# Patient Record
Sex: Male | Born: 1960 | Race: White | Hispanic: No | Marital: Single | Smoking: Never smoker
Health system: Southern US, Community
[De-identification: ages and names within clinical notes are randomized; demographics above are authoritative.]

## PROBLEM LIST (undated history)

## (undated) DIAGNOSIS — Z794 Long term (current) use of insulin: Secondary | ICD-10-CM

## (undated) DIAGNOSIS — I4891 Unspecified atrial fibrillation: Secondary | ICD-10-CM

## (undated) DIAGNOSIS — I2510 Atherosclerotic heart disease of native coronary artery without angina pectoris: Secondary | ICD-10-CM

## (undated) DIAGNOSIS — K3184 Gastroparesis: Secondary | ICD-10-CM

## (undated) DIAGNOSIS — D126 Benign neoplasm of colon, unspecified: Secondary | ICD-10-CM

## (undated) DIAGNOSIS — I729 Aneurysm of unspecified site: Secondary | ICD-10-CM

## (undated) DIAGNOSIS — G4730 Sleep apnea, unspecified: Secondary | ICD-10-CM

## (undated) DIAGNOSIS — K219 Gastro-esophageal reflux disease without esophagitis: Secondary | ICD-10-CM

## (undated) DIAGNOSIS — I10 Essential (primary) hypertension: Secondary | ICD-10-CM

## (undated) DIAGNOSIS — E119 Type 2 diabetes mellitus without complications: Secondary | ICD-10-CM

## (undated) DIAGNOSIS — IMO0001 Reserved for inherently not codable concepts without codable children: Secondary | ICD-10-CM

## (undated) DIAGNOSIS — E785 Hyperlipidemia, unspecified: Secondary | ICD-10-CM

## (undated) HISTORY — DX: Long term (current) use of insulin: Z79.4

## (undated) HISTORY — DX: Sleep apnea, unspecified: G47.30

## (undated) HISTORY — DX: Reserved for inherently not codable concepts without codable children: IMO0001

## (undated) HISTORY — PX: TONSILLECTOMY: SHX5217

## (undated) HISTORY — DX: Hyperlipidemia, unspecified: E78.5

## (undated) HISTORY — DX: Aneurysm of unspecified site: I72.9

## (undated) HISTORY — DX: Gastroparesis: K31.84

## (undated) HISTORY — DX: Atherosclerotic heart disease of native coronary artery without angina pectoris: I25.10

## (undated) HISTORY — DX: Benign neoplasm of colon, unspecified: D12.6

## (undated) HISTORY — DX: Unspecified atrial fibrillation: I48.91

## (undated) HISTORY — DX: Essential (primary) hypertension: I10

## (undated) HISTORY — DX: Gastro-esophageal reflux disease without esophagitis: K21.9

## (undated) HISTORY — DX: Type 2 diabetes mellitus without complications: E11.9

---

## 2010-10-24 ENCOUNTER — Ambulatory Visit (HOSPITAL_COMMUNITY)
Admission: RE | Admit: 2010-10-24 | Discharge: 2010-10-25 | Disposition: A | Discharge status: Home / Self Care | Payer: BC Managed Care – PPO | Source: Ambulatory Visit | Attending: Cardiovascular Disease | Admitting: Cardiovascular Disease

## 2010-10-24 DIAGNOSIS — R0989 Other specified symptoms and signs involving the circulatory and respiratory systems: Secondary | ICD-10-CM | POA: Insufficient documentation

## 2010-10-24 DIAGNOSIS — I10 Essential (primary) hypertension: Secondary | ICD-10-CM | POA: Insufficient documentation

## 2010-10-24 DIAGNOSIS — R0609 Other forms of dyspnea: Secondary | ICD-10-CM | POA: Insufficient documentation

## 2010-10-24 DIAGNOSIS — E119 Type 2 diabetes mellitus without complications: Secondary | ICD-10-CM | POA: Insufficient documentation

## 2010-10-24 HISTORY — PX: CARDIAC CATHETERIZATION: SHX172

## 2010-10-24 LAB — GLUCOSE, CAPILLARY: Glucose-Capillary: 174 mg/dL — ABNORMAL HIGH (ref 70–99)

## 2010-10-25 LAB — CBC
HCT: 40.4 % (ref 39.0–52.0)
Hemoglobin: 14.5 g/dL (ref 13.0–17.0)
MCH: 30.4 pg (ref 26.0–34.0)
RBC: 4.77 MIL/uL (ref 4.22–5.81)

## 2010-10-25 LAB — BASIC METABOLIC PANEL
CO2: 26 mEq/L (ref 19–32)
Glucose, Bld: 175 mg/dL — ABNORMAL HIGH (ref 70–99)
Potassium: 3.5 mEq/L (ref 3.5–5.1)
Sodium: 138 mEq/L (ref 135–145)

## 2010-10-27 NOTE — Cardiovascular Report (Signed)
NAME:  HAZE, ANTILLON NO.:  [id number]  MEDICAL RECORD NO.:  [id number]  LOCATION:  6523                         FACILITY:  [facility]  PHYSICIAN:  Nanetta Batty, M.D.   DATE OF BIRTH:  February 15, 1961  DATE OF PROCEDURE:  10/24/2010 DATE OF DISCHARGE:                           CARDIAC CATHETERIZATION   Mr. Wiebelhaus is a very pleasant 50 year old mildly overweight single Caucasian male with no children who recently relocated to [facility] and now works as a Proofreader housing at [employer].  The patient's risk factors include diabetes and hypertension.  His mother did have a stent at the age 63.  He has been having atypical chest pain and some dyspnea. Myoview stress test showed inferolateral ischemia.  He presents now for angiography and potential intervention.  DESCRIPTION OF PROCEDURE:  The patient was brought to the second floor [facility] cardiac cath lab in the postabsorptive state.  He was premedicated p.o. Valium and IV Versed.  His right groin was prepped and shaved in usual sterile fashion.  Xylocaine 1% was used for local anesthesia.  A 5-French sheath was inserted into the right femoral artery using standard Seldinger technique.  A 5-French right and left Judkins diagnostic catheter as well as 5-French pigtail catheter were used for selective cholangiography and left ventriculography respectively.  Visipaque dye was used for the entirety of the case. Left grade aortic, left ventricular blood pressures were recorded.  HEMODYNAMICS: 1. Aortic systolic pressure 170, diastolic pressure 90. 2. Left ventricular systolic pressure 177 and diastolic pressure 70.  SELECTIVE CORONARY ANGIOGRAPHY.: 1. Left main normal. 2. LAD; the LAD has 60% smooth mid segmental stenosis. 3. Left circumflex; totalled in the AV groove with faint left-to-left     collaterals. 4. Right coronary artery; dominant with a 50% proximal PDA lesion and     grade 2 right-to-left  collaterals. 5. Left ventriculography; RAO left ventriculogram was performed using     20 mL of Visipaque dye at 10 mL per second in the RAO and LAO     views.  The overall LVEF was estimated greater than 60% without     focal wall motion abnormalities.  IMPRESSION:  Mr. Vizcarrondo has had total large AV groove circumflex supplying significant marginal branch.  Does have ischemia, Myoview stress test, chest pain and dyspnea.  We will proceed with attempted recanalization of his "CTL."  DESCRIPTION OF PROCEDURE:  The existing 5-French sheath was upgraded to a 6-French sheath over wire.  The patient received 4 chewable aspirin, Effient 60 mg p.o. and Angiomax bolus with an ACT of 364.  A total of 210 mL of contrast was administered to the patient.  Median 6-French XB14 guide catheter along with 0.014 190 Asahi Prowater wire and a 2-0 15 and 28 emerge balloon.  I was able to cross the CTI with some difficulty.  I documented angiographically that I was in a true lumen and angioplasty at the segment of total occlusion.  A 200 mcg of intracoronary nitroglycerin was administered.  Following this, a 3030 resolute drug-eluting stent was then deployed 16 atmospheres.  The entire segment was postdilated with a 32520 Butler sprinter at 14 atmospheres (3.36-mm resulting reduction of a long  CT to 0% residual with excellent flow.  The patient tolerated the procedure well without hemodynamic cardiographic sequelae.  The guidewire and catheter removed. The sheath was then sewn securely in place.  The patient left lab in stable condition.  IMPRESSION:  Successful percutaneous coronary intervention stenting of a circumflex chronic total occlusion using resolute drug-eluting stent. The patient was treated with aspirin and Effient, hydrated and discharged home in the morning.  Left the lab in stable condition.     Nanetta Batty, M.D.     Cordelia Pen  D:  10/24/2010  T:  10/25/2010  Job:   [id number]  Electronically Signed by Nanetta Batty M.D. on 10/27/2010 03:37:09 PM

## 2010-10-31 ENCOUNTER — Emergency Department (HOSPITAL_COMMUNITY)
Admission: EM | Admit: 2010-10-31 | Discharge: 2010-11-01 | Disposition: A | Discharge status: Home / Self Care | Payer: BC Managed Care – PPO | Attending: Emergency Medicine | Admitting: Emergency Medicine

## 2010-10-31 DIAGNOSIS — E7800 Pure hypercholesterolemia, unspecified: Secondary | ICD-10-CM | POA: Insufficient documentation

## 2010-10-31 DIAGNOSIS — I2510 Atherosclerotic heart disease of native coronary artery without angina pectoris: Secondary | ICD-10-CM | POA: Insufficient documentation

## 2010-10-31 DIAGNOSIS — S7010XA Contusion of unspecified thigh, initial encounter: Secondary | ICD-10-CM | POA: Insufficient documentation

## 2010-10-31 DIAGNOSIS — G8918 Other acute postprocedural pain: Secondary | ICD-10-CM | POA: Insufficient documentation

## 2010-10-31 DIAGNOSIS — E119 Type 2 diabetes mellitus without complications: Secondary | ICD-10-CM | POA: Insufficient documentation

## 2010-10-31 DIAGNOSIS — Z79899 Other long term (current) drug therapy: Secondary | ICD-10-CM | POA: Insufficient documentation

## 2010-10-31 DIAGNOSIS — I10 Essential (primary) hypertension: Secondary | ICD-10-CM | POA: Insufficient documentation

## 2010-10-31 DIAGNOSIS — M546 Pain in thoracic spine: Secondary | ICD-10-CM | POA: Insufficient documentation

## 2010-10-31 DIAGNOSIS — R252 Cramp and spasm: Secondary | ICD-10-CM | POA: Insufficient documentation

## 2010-10-31 DIAGNOSIS — Z7982 Long term (current) use of aspirin: Secondary | ICD-10-CM | POA: Insufficient documentation

## 2010-10-31 DIAGNOSIS — Y840 Cardiac catheterization as the cause of abnormal reaction of the patient, or of later complication, without mention of misadventure at the time of the procedure: Secondary | ICD-10-CM | POA: Insufficient documentation

## 2010-11-01 LAB — PROTIME-INR: Prothrombin Time: 13.1 seconds (ref 11.6–15.2)

## 2010-11-01 LAB — DIFFERENTIAL
Lymphs Abs: 2.7 10*3/uL (ref 0.7–4.0)
Monocytes Relative: 7 % (ref 3–12)
Neutro Abs: 5 10*3/uL (ref 1.7–7.7)
Neutrophils Relative %: 57 % (ref 43–77)

## 2010-11-01 LAB — CBC
HCT: 40.6 % (ref 39.0–52.0)
Hemoglobin: 14.6 g/dL (ref 13.0–17.0)
MCH: 30.4 pg (ref 26.0–34.0)
MCV: 84.4 fL (ref 78.0–100.0)
RBC: 4.81 MIL/uL (ref 4.22–5.81)

## 2010-11-01 LAB — COMPREHENSIVE METABOLIC PANEL
Albumin: 4.2 g/dL (ref 3.5–5.2)
Alkaline Phosphatase: 107 U/L (ref 39–117)
BUN: 18 mg/dL (ref 6–23)
Chloride: 102 mEq/L (ref 96–112)
Glucose, Bld: 83 mg/dL (ref 70–99)
Potassium: 3.3 mEq/L — ABNORMAL LOW (ref 3.5–5.1)
Total Bilirubin: 0.3 mg/dL (ref 0.3–1.2)

## 2010-11-01 NOTE — Discharge Summary (Signed)
NAMEMarland Kitchen  Bradley Holmes, Bradley Holmes NO.:  [id number]  MEDICAL RECORD NO.:  [id number]  LOCATION:  6523                         FACILITY:  [facility]  PHYSICIAN:  Nanetta Batty, M.D.   DATE OF BIRTH:  02/02/1961  DATE OF ADMISSION:  10/24/2010 DATE OF DISCHARGE:  10/25/2010                              DISCHARGE SUMMARY   DISCHARGE DIAGNOSIS:  Coronary artery disease status post Resolute drug- eluting stent to the circumflex.  HOSPITAL COURSE:  Bradley Holmes is a 50 year old Caucasian male with history of hypertension, diabetes mellitus.  He recently underwent a 2-D echocardiogram which is normal and a Myoview stress test which was read as high risk for inferior and inferolateral ischemia.  He presented on August 14 for diagnostic left heart catheterization.  This resulted in identification of 60% smooth mid segmental stenosis in the LAD.  Also, identified totalled left circumflex in the AV groove with faint left-to- right collaterals, and subsequent underwent stenting of the circumflex with a Resolute drug-eluting stent.  Ejection fraction was noted 60% or greater without any focal wall motion abnormalities.  The patient currently was seen by Dr. Allyson Sabal, who feels he is stable for discharge home.  Follow up in 1 to 2 weeks.  DISCHARGE LABS:  WBC 10.1, hemoglobin 14.5, hematocrit 40.4, platelets 212.  Sodium 138, potassium 3.5, chloride 104, carbon dioxide 26, BUN 12, creatinine 0.87, glucose 175.  STUDIES/PROCEDURES:  Left heart catheterization, October 24, 2010.  Left main is normal.  The LAD had 60% smooth mid segmental stenosis.  Left circumflex was total in the AV groove with faint left-to-right collaterals.  The right coronary artery was dominant with 50% proximal PDA lesion and grade 2 right-to-left collaterals.  Ejection fraction estimated greater than 60% without focal wall motion abnormalities. Successful percutaneous coronary intervention, stenting of the circumflex,  chronic total occlusion using a Resolute drug-eluting stent.  DISCHARGE MEDICATIONS: 1. Acetaminophen 325 mg two tablets by mouth every 4 hours as needed     for pain. 2. Aspirin enteric coated 325 mg one tablet by mouth daily. 3. Prasugrel 10 mg one tablet by mouth daily. 4. Diltiazem CD XT 360 mg one capsule by mouth daily at bedtime. 5. Glyburide/metformin 5/500 mg two tablets by mouth twice daily.  The     patient will restart this medication on Thursday, October 26, 2010. 6. Hydrochlorothiazide 12.5 mg one capsule by mouth daily. 7. Linagliptin 5 mg one tablet by mouth daily. 8. Losartan 50 mg one tablet by mouth twice daily.  DISPOSITION:  Bradley Holmes will be discharged home in stable conditions. He is recommended to increase his activity slowly.  May shower and bathe.  No lifting for 3 days greater than 5 pounds.  No driving for 3 days.  He is recommended he returns to work as soon on October 30, 2010.  He is also recommended to eat a heart-healthy low-carbohydrate diet.  If catheter site becomes red, painful swollen, discharges fluid or pus, he is to call our office.  He will follow up with Dr. Allyson Sabal and our office will call him with the appointment time.    ______________________________ Wilburt Finlay, PA   ______________________________ Nanetta Batty, M.D.  BH/MEDQ  D:  10/25/2010  T:  10/25/2010  Job:  [id number]  cc:   Larina Earthly, M.D.  Electronically Signed by Wilburt Finlay PA on 10/27/2010 03:35:11 PM Electronically Signed by Nanetta Batty M.D. on 11/01/2010 03:27:52 PM

## 2010-12-01 ENCOUNTER — Ambulatory Visit (HOSPITAL_COMMUNITY): Payer: BC Managed Care – PPO

## 2010-12-04 ENCOUNTER — Ambulatory Visit (HOSPITAL_COMMUNITY): Payer: BC Managed Care – PPO

## 2010-12-04 ENCOUNTER — Other Ambulatory Visit: Payer: Self-pay | Admitting: Cardiovascular Disease

## 2010-12-04 ENCOUNTER — Encounter (HOSPITAL_COMMUNITY)
Admission: RE | Admit: 2010-12-04 | Discharge: 2010-12-04 | Disposition: A | Discharge status: Home / Self Care | Payer: BC Managed Care – PPO | Source: Ambulatory Visit | Attending: Cardiovascular Disease | Admitting: Cardiovascular Disease

## 2010-12-04 DIAGNOSIS — Z9861 Coronary angioplasty status: Secondary | ICD-10-CM | POA: Insufficient documentation

## 2010-12-04 DIAGNOSIS — Z5189 Encounter for other specified aftercare: Secondary | ICD-10-CM | POA: Insufficient documentation

## 2010-12-04 DIAGNOSIS — I2510 Atherosclerotic heart disease of native coronary artery without angina pectoris: Secondary | ICD-10-CM | POA: Insufficient documentation

## 2010-12-04 LAB — GLUCOSE, CAPILLARY: Glucose-Capillary: 212 mg/dL — ABNORMAL HIGH (ref 70–99)

## 2010-12-06 ENCOUNTER — Encounter (HOSPITAL_COMMUNITY): Payer: BC Managed Care – PPO

## 2010-12-06 ENCOUNTER — Other Ambulatory Visit: Payer: Self-pay | Admitting: Cardiovascular Disease

## 2010-12-08 ENCOUNTER — Other Ambulatory Visit: Payer: Self-pay | Admitting: Cardiovascular Disease

## 2010-12-08 ENCOUNTER — Encounter (HOSPITAL_COMMUNITY): Payer: BC Managed Care – PPO

## 2010-12-08 LAB — GLUCOSE, CAPILLARY: Glucose-Capillary: 165 mg/dL — ABNORMAL HIGH (ref 70–99)

## 2010-12-11 ENCOUNTER — Other Ambulatory Visit: Payer: Self-pay | Admitting: Cardiovascular Disease

## 2010-12-11 ENCOUNTER — Encounter (HOSPITAL_COMMUNITY): Payer: BC Managed Care – PPO | Attending: Cardiovascular Disease

## 2010-12-11 DIAGNOSIS — I2510 Atherosclerotic heart disease of native coronary artery without angina pectoris: Secondary | ICD-10-CM | POA: Insufficient documentation

## 2010-12-11 DIAGNOSIS — Z9861 Coronary angioplasty status: Secondary | ICD-10-CM | POA: Insufficient documentation

## 2010-12-11 DIAGNOSIS — Z5189 Encounter for other specified aftercare: Secondary | ICD-10-CM | POA: Insufficient documentation

## 2010-12-11 LAB — GLUCOSE, CAPILLARY: Glucose-Capillary: 218 mg/dL — ABNORMAL HIGH (ref 70–99)

## 2010-12-13 ENCOUNTER — Encounter (HOSPITAL_COMMUNITY): Payer: BC Managed Care – PPO

## 2010-12-13 ENCOUNTER — Other Ambulatory Visit: Payer: Self-pay | Admitting: Cardiovascular Disease

## 2010-12-13 LAB — GLUCOSE, CAPILLARY: Glucose-Capillary: 215 mg/dL — ABNORMAL HIGH (ref 70–99)

## 2010-12-15 ENCOUNTER — Other Ambulatory Visit: Payer: Self-pay | Admitting: Cardiovascular Disease

## 2010-12-15 ENCOUNTER — Encounter (HOSPITAL_COMMUNITY): Payer: BC Managed Care – PPO

## 2010-12-15 LAB — GLUCOSE, CAPILLARY: Glucose-Capillary: 262 mg/dL — ABNORMAL HIGH (ref 70–99)

## 2010-12-18 ENCOUNTER — Encounter (HOSPITAL_COMMUNITY): Payer: BC Managed Care – PPO

## 2010-12-20 ENCOUNTER — Encounter (HOSPITAL_COMMUNITY): Payer: BC Managed Care – PPO

## 2010-12-22 ENCOUNTER — Encounter (HOSPITAL_COMMUNITY): Payer: BC Managed Care – PPO

## 2010-12-25 ENCOUNTER — Encounter (HOSPITAL_COMMUNITY): Payer: BC Managed Care – PPO

## 2010-12-27 ENCOUNTER — Encounter (HOSPITAL_COMMUNITY): Payer: BC Managed Care – PPO

## 2010-12-29 ENCOUNTER — Encounter (HOSPITAL_COMMUNITY): Payer: BC Managed Care – PPO

## 2011-01-01 ENCOUNTER — Encounter (HOSPITAL_COMMUNITY): Payer: BC Managed Care – PPO

## 2011-01-03 ENCOUNTER — Encounter (HOSPITAL_COMMUNITY): Payer: BC Managed Care – PPO

## 2011-01-05 ENCOUNTER — Encounter (HOSPITAL_COMMUNITY): Payer: BC Managed Care – PPO

## 2011-01-08 ENCOUNTER — Encounter (HOSPITAL_COMMUNITY): Payer: BC Managed Care – PPO

## 2011-01-10 ENCOUNTER — Encounter (HOSPITAL_COMMUNITY): Payer: BC Managed Care – PPO

## 2011-01-12 ENCOUNTER — Encounter (HOSPITAL_COMMUNITY): Payer: BC Managed Care – PPO

## 2011-01-12 DIAGNOSIS — Z5189 Encounter for other specified aftercare: Secondary | ICD-10-CM | POA: Insufficient documentation

## 2011-01-12 DIAGNOSIS — I2510 Atherosclerotic heart disease of native coronary artery without angina pectoris: Secondary | ICD-10-CM | POA: Insufficient documentation

## 2011-01-12 DIAGNOSIS — Z9861 Coronary angioplasty status: Secondary | ICD-10-CM | POA: Insufficient documentation

## 2011-01-15 ENCOUNTER — Encounter (HOSPITAL_COMMUNITY): Payer: BC Managed Care – PPO

## 2011-01-17 ENCOUNTER — Encounter (HOSPITAL_COMMUNITY): Payer: BC Managed Care – PPO

## 2011-01-19 ENCOUNTER — Encounter (HOSPITAL_COMMUNITY): Payer: BC Managed Care – PPO

## 2011-01-22 ENCOUNTER — Encounter (HOSPITAL_COMMUNITY): Payer: BC Managed Care – PPO

## 2011-01-24 ENCOUNTER — Encounter (HOSPITAL_COMMUNITY): Payer: BC Managed Care – PPO

## 2011-01-26 ENCOUNTER — Encounter (HOSPITAL_COMMUNITY)
Admission: RE | Admit: 2011-01-26 | Discharge: 2011-01-26 | Disposition: A | Discharge status: Home / Self Care | Payer: BC Managed Care – PPO | Source: Ambulatory Visit | Attending: Cardiovascular Disease | Admitting: Cardiovascular Disease

## 2011-01-29 ENCOUNTER — Encounter (HOSPITAL_COMMUNITY)
Admission: RE | Admit: 2011-01-29 | Discharge: 2011-01-29 | Disposition: A | Discharge status: Home / Self Care | Payer: BC Managed Care – PPO | Source: Ambulatory Visit | Attending: Cardiovascular Disease | Admitting: Cardiovascular Disease

## 2011-01-31 ENCOUNTER — Encounter (HOSPITAL_COMMUNITY): Payer: BC Managed Care – PPO

## 2011-02-05 ENCOUNTER — Encounter (HOSPITAL_COMMUNITY)
Admission: RE | Admit: 2011-02-05 | Discharge: 2011-02-05 | Disposition: A | Discharge status: Home / Self Care | Payer: BC Managed Care – PPO | Source: Ambulatory Visit | Attending: Cardiovascular Disease | Admitting: Cardiovascular Disease

## 2011-02-07 ENCOUNTER — Encounter (HOSPITAL_COMMUNITY)
Admission: RE | Admit: 2011-02-07 | Discharge: 2011-02-07 | Disposition: A | Discharge status: Home / Self Care | Payer: BC Managed Care – PPO | Source: Ambulatory Visit | Attending: Cardiovascular Disease | Admitting: Cardiovascular Disease

## 2011-02-09 ENCOUNTER — Encounter (HOSPITAL_COMMUNITY)
Admission: RE | Admit: 2011-02-09 | Discharge: 2011-02-09 | Disposition: A | Discharge status: Home / Self Care | Payer: BC Managed Care – PPO | Source: Ambulatory Visit | Attending: Cardiovascular Disease | Admitting: Cardiovascular Disease

## 2011-02-12 ENCOUNTER — Encounter (HOSPITAL_COMMUNITY)
Admission: RE | Admit: 2011-02-12 | Discharge: 2011-02-12 | Disposition: A | Discharge status: Home / Self Care | Payer: BC Managed Care – PPO | Source: Ambulatory Visit | Attending: Cardiovascular Disease | Admitting: Cardiovascular Disease

## 2011-02-12 DIAGNOSIS — Z5189 Encounter for other specified aftercare: Secondary | ICD-10-CM | POA: Insufficient documentation

## 2011-02-12 DIAGNOSIS — I2510 Atherosclerotic heart disease of native coronary artery without angina pectoris: Secondary | ICD-10-CM | POA: Insufficient documentation

## 2011-02-12 DIAGNOSIS — Z9861 Coronary angioplasty status: Secondary | ICD-10-CM | POA: Insufficient documentation

## 2011-02-14 ENCOUNTER — Encounter (HOSPITAL_COMMUNITY)
Admission: RE | Admit: 2011-02-14 | Discharge: 2011-02-14 | Disposition: A | Discharge status: Home / Self Care | Payer: BC Managed Care – PPO | Source: Ambulatory Visit | Attending: Cardiovascular Disease | Admitting: Cardiovascular Disease

## 2011-02-16 ENCOUNTER — Encounter (HOSPITAL_COMMUNITY): Payer: BC Managed Care – PPO

## 2011-02-19 ENCOUNTER — Encounter (HOSPITAL_COMMUNITY)
Admission: RE | Admit: 2011-02-19 | Discharge: 2011-02-19 | Disposition: A | Discharge status: Home / Self Care | Payer: BC Managed Care – PPO | Source: Ambulatory Visit | Attending: Cardiovascular Disease | Admitting: Cardiovascular Disease

## 2011-02-21 ENCOUNTER — Encounter (HOSPITAL_COMMUNITY)
Admission: RE | Admit: 2011-02-21 | Discharge: 2011-02-21 | Disposition: A | Discharge status: Home / Self Care | Payer: BC Managed Care – PPO | Source: Ambulatory Visit | Attending: Cardiovascular Disease | Admitting: Cardiovascular Disease

## 2011-02-23 ENCOUNTER — Encounter (HOSPITAL_COMMUNITY)
Admission: RE | Admit: 2011-02-23 | Discharge: 2011-02-23 | Disposition: A | Discharge status: Home / Self Care | Payer: BC Managed Care – PPO | Source: Ambulatory Visit | Attending: Cardiovascular Disease | Admitting: Cardiovascular Disease

## 2011-02-26 ENCOUNTER — Encounter (HOSPITAL_COMMUNITY)
Admission: RE | Admit: 2011-02-26 | Discharge: 2011-02-26 | Disposition: A | Discharge status: Home / Self Care | Payer: BC Managed Care – PPO | Source: Ambulatory Visit | Attending: Cardiovascular Disease | Admitting: Cardiovascular Disease

## 2011-02-28 ENCOUNTER — Encounter (HOSPITAL_COMMUNITY): Payer: BC Managed Care – PPO

## 2011-03-02 ENCOUNTER — Encounter (HOSPITAL_COMMUNITY)
Admission: RE | Admit: 2011-03-02 | Discharge: 2011-03-02 | Disposition: A | Discharge status: Home / Self Care | Payer: BC Managed Care – PPO | Source: Ambulatory Visit | Attending: Cardiovascular Disease | Admitting: Cardiovascular Disease

## 2011-03-05 ENCOUNTER — Encounter (HOSPITAL_COMMUNITY): Payer: BC Managed Care – PPO

## 2011-03-07 ENCOUNTER — Encounter (HOSPITAL_COMMUNITY): Payer: BC Managed Care – PPO

## 2011-03-09 ENCOUNTER — Encounter (HOSPITAL_COMMUNITY): Payer: BC Managed Care – PPO

## 2011-03-16 ENCOUNTER — Encounter (HOSPITAL_COMMUNITY)
Admission: RE | Admit: 2011-03-16 | Discharge: 2011-03-16 | Disposition: A | Discharge status: Home / Self Care | Payer: BC Managed Care – PPO | Source: Ambulatory Visit | Attending: Cardiovascular Disease | Admitting: Cardiovascular Disease

## 2011-03-16 DIAGNOSIS — Z9861 Coronary angioplasty status: Secondary | ICD-10-CM | POA: Insufficient documentation

## 2011-03-16 DIAGNOSIS — I2510 Atherosclerotic heart disease of native coronary artery without angina pectoris: Secondary | ICD-10-CM | POA: Insufficient documentation

## 2011-03-16 DIAGNOSIS — Z5189 Encounter for other specified aftercare: Secondary | ICD-10-CM | POA: Insufficient documentation

## 2011-03-19 ENCOUNTER — Encounter (HOSPITAL_COMMUNITY)
Admission: RE | Admit: 2011-03-19 | Discharge: 2011-03-19 | Disposition: A | Discharge status: Home / Self Care | Payer: BC Managed Care – PPO | Source: Ambulatory Visit | Attending: Cardiovascular Disease | Admitting: Cardiovascular Disease

## 2011-03-21 ENCOUNTER — Encounter (HOSPITAL_COMMUNITY)
Admission: RE | Admit: 2011-03-21 | Discharge: 2011-03-21 | Disposition: A | Discharge status: Home / Self Care | Payer: BC Managed Care – PPO | Source: Ambulatory Visit | Attending: Cardiovascular Disease | Admitting: Cardiovascular Disease

## 2011-03-23 ENCOUNTER — Encounter (HOSPITAL_COMMUNITY)
Admission: RE | Admit: 2011-03-23 | Discharge: 2011-03-23 | Disposition: A | Discharge status: Home / Self Care | Payer: BC Managed Care – PPO | Source: Ambulatory Visit | Attending: Cardiovascular Disease | Admitting: Cardiovascular Disease

## 2011-03-26 ENCOUNTER — Encounter (HOSPITAL_COMMUNITY)
Admission: RE | Admit: 2011-03-26 | Discharge: 2011-03-26 | Disposition: A | Discharge status: Home / Self Care | Payer: BC Managed Care – PPO | Source: Ambulatory Visit | Attending: Cardiovascular Disease | Admitting: Cardiovascular Disease

## 2011-03-28 ENCOUNTER — Encounter (HOSPITAL_COMMUNITY): Admission: RE | Admit: 2011-03-28 | Payer: BC Managed Care – PPO | Source: Ambulatory Visit

## 2011-03-30 ENCOUNTER — Encounter (HOSPITAL_COMMUNITY): Payer: BC Managed Care – PPO

## 2011-04-02 ENCOUNTER — Encounter (HOSPITAL_COMMUNITY): Payer: BC Managed Care – PPO

## 2011-04-04 ENCOUNTER — Encounter (HOSPITAL_COMMUNITY)
Admission: RE | Admit: 2011-04-04 | Discharge: 2011-04-04 | Disposition: A | Discharge status: Home / Self Care | Payer: BC Managed Care – PPO | Source: Ambulatory Visit | Attending: Cardiovascular Disease | Admitting: Cardiovascular Disease

## 2011-04-05 NOTE — Progress Notes (Addendum)
Cardiac Rehabilitation Program Progress Report   Orientation:  11/30/2010 Graduate Date:  04/04/2011 Discharge Date:   # of sessions completed: 36  Cardiologist: Wilhemina Cash MD:  Ava Class Time:  0815  A.  Exercise Program:  Tolerates exercise @ 3.5 METS for 30 minutes, Improved functional capacity  3.26 %, Improved  muscular strength  10.26 %, Improved  flexibility 25.00 % and Discharged to home exercise program.  Anticipated compliance:  fair  B.  Mental Health:    C.  Education/Instruction/Skills  Accurately checks own pulse, Knows THR for exercise, Uses Perceived Exertion Scale and Attended 10 education classes    D.  Nutrition/Weight Control/Body Composition:  % Body Fat  34.5 and Patient has gained 0.6 kg BMI 33.4, Pt following a step 2 TLC diet on admission to Cardiac Rehab.  No post-rehab data available to assess.  *This section completed by Mickle Plumb, Andres Shad, RD, LDN, CDE  E.  Blood Lipids No results found for this basename: CHOL, HDL, LDLCALC, LDLDIRECT, TRIG, CHOLHDL  *This section completed by Mickle Plumb, Andres Shad, RD, LDN, CDE  F.  Lifestyle Changes:    G.  Symptoms noted with exercise:  Asymptomatic  Report Completed By:  Dayton Martes   Comments:  Signed by Harriett Sine MS on Thursday January 24th 2013 at 0712       Pt successfully graduated from cardiac rehab program, attending 35/36 sessions from 12/04/2010-04/04/2011.  Pt had good attendance with exercise and educational classes.  Pt had significant bradycardia at rest (lowest rate-44) pt asymptomatic, strips faxed to Dr. Allyson Sabal for review.  Pt also hypertensive with exercise (highest BP-196/80 following a workload increase-down to 140/70 with decreased workload)  Exercise induced hypertension primarily noted while pt using airdyne.  Dr. Allyson Sabal made aware, no new orders received. On 12/25/2010 pt had brief episode of epigastric discomfort, VSS, no significant ST changes noted,  sx relieved with rest.  Pt denied any further episodes of exertional discomfort.  Pt did not have hospital admission during cardiac rehab period.   Pt has made positive lifestyle changes and should be encouraged to continue.  pt plans to exercise on his own at fitness facility.  Thank you for the referral.  Deveron Furlong, RN Cardiac Pulmonary Rehab

## 2011-04-06 ENCOUNTER — Encounter (HOSPITAL_COMMUNITY): Payer: BC Managed Care – PPO

## 2011-04-27 ENCOUNTER — Encounter (HOSPITAL_COMMUNITY): Payer: Self-pay | Admitting: Cardiac Rehabilitation

## 2011-05-15 ENCOUNTER — Encounter (HOSPITAL_COMMUNITY): Payer: Self-pay | Admitting: Cardiac Rehabilitation

## 2012-07-30 ENCOUNTER — Other Ambulatory Visit: Payer: Self-pay | Admitting: Cardiovascular Disease

## 2012-08-01 ENCOUNTER — Other Ambulatory Visit: Payer: Self-pay | Admitting: *Deleted

## 2012-08-01 NOTE — Telephone Encounter (Signed)
Refill request for Bystolic.  No record of Bystolic on file and pt not seen since May 2013.  Faxed back to send to original prescriber.

## 2012-08-05 ENCOUNTER — Telehealth: Payer: Self-pay | Admitting: Cardiovascular Disease

## 2012-08-05 MED ORDER — PRASUGREL HCL 10 MG PO TABS: 10.0000 mg | ORAL_TABLET | Freq: Every day | ORAL | Status: DC

## 2012-08-05 NOTE — Telephone Encounter (Signed)
Returning call from [facility] from Friday!

## 2012-08-05 NOTE — Telephone Encounter (Signed)
Returned call.  Pt informed call was r/t refill request for Bystolic.  Not on med list and pt had not been seen since May 2013.  Pt informed refill was sent, but he still needs an appt.  Pt also requested refill on effient.  Appt scheduled for 6.6.14 at 10:30am w/ Dr. Allyson Sabal for annual f/u and rx sent to [facility] [phone]) per pt request.

## 2012-08-05 NOTE — Telephone Encounter (Signed)
Returning your call. °

## 2012-08-05 NOTE — Telephone Encounter (Signed)
Returned call. Left message to call back.

## 2012-08-09 ENCOUNTER — Other Ambulatory Visit: Payer: Self-pay | Admitting: Cardiovascular Disease

## 2012-08-12 NOTE — Telephone Encounter (Signed)
Sent in refill on 5/27 with pharmacy confirmation of receipt

## 2012-08-12 NOTE — Telephone Encounter (Signed)
Already done. Pharmacy confirmed receipt

## 2012-08-15 ENCOUNTER — Ambulatory Visit: Payer: BC Managed Care – PPO | Admitting: Cardiovascular Disease

## 2012-08-29 ENCOUNTER — Encounter: Payer: Self-pay | Admitting: Cardiovascular Disease

## 2012-09-01 ENCOUNTER — Ambulatory Visit (INDEPENDENT_AMBULATORY_CARE_PROVIDER_SITE_OTHER): Payer: BC Managed Care – PPO | Admitting: Cardiovascular Disease

## 2012-09-01 ENCOUNTER — Encounter: Payer: Self-pay | Admitting: Cardiovascular Disease

## 2012-09-01 VITALS — BP 122/76 | HR 52 | Ht 66.0 in | Wt 217.0 lb

## 2012-09-01 DIAGNOSIS — IMO0001 Reserved for inherently not codable concepts without codable children: Secondary | ICD-10-CM

## 2012-09-01 DIAGNOSIS — E119 Type 2 diabetes mellitus without complications: Secondary | ICD-10-CM

## 2012-09-01 DIAGNOSIS — I10 Essential (primary) hypertension: Secondary | ICD-10-CM

## 2012-09-01 DIAGNOSIS — E785 Hyperlipidemia, unspecified: Secondary | ICD-10-CM

## 2012-09-01 DIAGNOSIS — Z79899 Other long term (current) drug therapy: Secondary | ICD-10-CM

## 2012-09-01 DIAGNOSIS — Z794 Long term (current) use of insulin: Secondary | ICD-10-CM

## 2012-09-01 DIAGNOSIS — I2510 Atherosclerotic heart disease of native coronary artery without angina pectoris: Secondary | ICD-10-CM

## 2012-09-01 MED ORDER — CLOPIDOGREL BISULFATE 75 MG PO TABS: 75.0000 mg | ORAL_TABLET | Freq: Every day | ORAL | Status: DC

## 2012-09-01 NOTE — Assessment & Plan Note (Signed)
On statin medication followed by Dr. Felipa Eth

## 2012-09-01 NOTE — Assessment & Plan Note (Signed)
Status post stenting of a circumflex, total occlusion 10/24/10 with a resolute drug-eluting stent. He had a 50% mid LAD and a 50% proximal PDA with normal LV function. Subsequent Myoview stress test performed 01/05/11 showed improvement in his lateral ischemia. He gets occasional atypical chest pain nothing like his pre-intervention symptoms. He continues to get FEN. I'm going to stop this and begin him on clopidogrel and check a verify now test to determine efficacy.

## 2012-09-01 NOTE — Progress Notes (Signed)
09/01/2012 Bradley Holmes   August 30, 1960  [id number]  Primary Physician Hoyle Sauer, MD Primary Cardiologist: Runell Gess MD Roseanne Reno   HPI:  The patient is a 52 year old mildly overweight single Caucasian male with no children whom I last saw in the office 11/09/2010. 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 He was initially referred because of an abnormal EKG with risk factors that included diabetes, hypertension and hyperlipidemia as well as family history. He was getting some chest pain and dyspnea. His EKG showed poor R-wave progression with Q-waves, a left anterior fascicular block. Myoview stress test showed inferolateral scar with mild peri/infarct ischemia, and because of this I performed cardiac catheterization on him 10/24/2010 revealing an occluded circumflex in the mid AV groove, 50% mid LAD and proximal PDA stenosis with normal LV function. I was ultimately able to cross his chronically occluded circumflex and stented him with a resolute 3.0 x 30 drug-eluting stent. Since that time, he had done well. A followup Myoview showed improved perfusion to the lateral wall. Since I saw him a year ago he's unremarkable he well. He gets occasional atypical left inframammary memory chest pain not like his preintervention symptoms.   Current Outpatient Prescriptions  Medication Sig Dispense Refill  . Acetaminophen (TYLENOL PO) Take by mouth as needed.      Marland Kitchen aspirin 325 MG tablet Take 325 mg by mouth daily.      . BD PEN NEEDLE NANO U/F 32G X 4 MM MISC       . BYSTOLIC 10 MG tablet TAKE 1 TABLET EVERY EVENING  30 tablet  2  . Coenzyme Q10 (CO Q-10) 200 MG CAPS Take 1 tablet by mouth daily.      Marland Kitchen doxycycline (MONODOX) 100 MG capsule Take 100 mg by mouth 2 (two) times daily.      Marland Kitchen glyBURIDE-metformin (GLUCOVANCE) 2.5-500 MG per tablet Take 1 tablet by mouth. 2 tablets in morning and 1  tablet in the evening      . INVOKANA 300 MG TABS Take 300 mg by mouth daily.      Marland Kitchen LANTUS SOLOSTAR 100 UNIT/ML SOPN 12units daily      . prasugrel (EFFIENT) 10 MG TABS Take 1 tablet (10 mg total) by mouth daily.  30 tablet  3  . rosuvastatin (CRESTOR) 5 MG tablet Take 5 mg by mouth every other day.       . triamcinolone cream (KENALOG) 0.1 % as needed.      Marya Landry 40-10-25 MG TABS Take 1 tablet by mouth daily.        No current facility-administered medications for this visit.    Allergies  Allergen Reactions  . Norvasc (Amlodipine Besylate) Other (See Comments)    Edema  . Sulfa Antibiotics Hives    History   Social History  . Marital Status: Single    Spouse Name: N/A    Number of Children: N/A  . Years of Education: N/A   Occupational History  . Not on file.   Social History Main Topics  . Smoking status: Never Smoker   . Smokeless tobacco: Not on file  . Alcohol Use: No  . Drug Use: Not on file  . Sexually Active: Not on file   Other Topics Concern  . Not on file   Social History Narrative  . No narrative on file     Review of Systems: General:  negative for chills, fever, night sweats or weight changes.  Cardiovascular: negative for chest pain, dyspnea on exertion, edema, orthopnea, palpitations, paroxysmal nocturnal dyspnea or shortness of breath Dermatological: negative for rash Respiratory: negative for cough or wheezing Urologic: negative for hematuria Abdominal: negative for nausea, vomiting, diarrhea, bright red blood per rectum, melena, or hematemesis Neurologic: negative for visual changes, syncope, or dizziness All other systems reviewed and are otherwise negative except as noted above.    Blood pressure 122/76, pulse 52, height 5\' 6"  (1.676 m), weight 217 lb (98.431 kg).  General appearance: alert and no distress Neck: no adenopathy, no carotid bruit, no JVD, supple, symmetrical, trachea midline and thyroid not enlarged, symmetric, no  tenderness/mass/nodules Lungs: clear to auscultation bilaterally Heart: regular rate and rhythm, S1, S2 normal, no murmur, click, rub or gallop Extremities: extremities normal, atraumatic, no cyanosis or edema  EKG sinus bradycardia at 52 with T wave inversion in leads III and F unchanged from prior EKGs  ASSESSMENT AND PLAN:   Coronary artery disease Status post stenting of a circumflex, total occlusion 10/24/10 with a resolute drug-eluting stent. He had a 50% mid LAD and a 50% proximal PDA with normal LV function. Subsequent Myoview stress test performed 01/05/11 showed improvement in his lateral ischemia. He gets occasional atypical chest pain nothing like his pre-intervention symptoms. He continues to get FEN. I'm going to stop this and begin him on clopidogrel and check a verify now test to determine efficacy.  Essential hypertension Under good control on current medications  Hyperlipidemia On statin medication followed by Dr. Felipa Eth      Runell Gess MD [facility], [facility] 09/01/2012 11:46 AM

## 2012-09-01 NOTE — Patient Instructions (Addendum)
Stop the effient Start plavix 75 mg daily  After you have been on plavix for 2 weeks, have the bloodwork done at [facility] to see if the Plavix is working  Follow up with Dr Allyson Sabal in 12 months

## 2012-09-01 NOTE — Assessment & Plan Note (Signed)
Under good control on current medications 

## 2012-11-14 ENCOUNTER — Ambulatory Visit (HOSPITAL_COMMUNITY)
Admission: RE | Admit: 2012-11-14 | Discharge: 2012-11-14 | Disposition: A | Discharge status: Home / Self Care | Payer: BC Managed Care – PPO | Source: Ambulatory Visit | Attending: Cardiovascular Disease | Admitting: Cardiovascular Disease

## 2012-11-14 ENCOUNTER — Encounter: Payer: Self-pay | Admitting: Internal Medicine

## 2012-11-16 ENCOUNTER — Other Ambulatory Visit: Payer: Self-pay | Admitting: Cardiovascular Disease

## 2012-11-17 ENCOUNTER — Encounter: Payer: Self-pay | Admitting: *Deleted

## 2012-11-17 NOTE — Telephone Encounter (Signed)
Rx was sent to pharmacy electronically. 

## 2012-12-12 ENCOUNTER — Ambulatory Visit (INDEPENDENT_AMBULATORY_CARE_PROVIDER_SITE_OTHER): Payer: BC Managed Care – PPO | Admitting: Internal Medicine

## 2012-12-12 ENCOUNTER — Encounter: Payer: Self-pay | Admitting: Internal Medicine

## 2012-12-12 ENCOUNTER — Telehealth: Payer: Self-pay | Admitting: *Deleted

## 2012-12-12 VITALS — BP 100/62 | HR 50 | Ht 67.0 in | Wt 220.2 lb

## 2012-12-12 DIAGNOSIS — R1319 Other dysphagia: Secondary | ICD-10-CM

## 2012-12-12 DIAGNOSIS — D689 Coagulation defect, unspecified: Secondary | ICD-10-CM

## 2012-12-12 MED ORDER — MOVIPREP 100 G PO SOLR: 1.0000 | Freq: Once | ORAL | Status: DC

## 2012-12-12 NOTE — Patient Instructions (Addendum)
You have been scheduled for a colonoscopy with propofol. Please follow written instructions given to you at your visit today.  Please pick up your prep kit at the pharmacy within the next 1-3 days. If you use inhalers (even only as needed), please bring them with you on the day of your procedure. Your physician has requested that you go to www.startemmi.com and enter the access code given to you at your visit today. This web site gives a general overview about your procedure. However, you should still follow specific instructions given to you by our office regarding your preparation for the procedure.  You will be contacted by our office prior to your procedure for directions on holding your Plavix.  If you do not hear from our office 1 week prior to your scheduled procedure, please call [phone] to discuss.   CC: Dr R.Avva, Dr Allyson Sabal

## 2012-12-12 NOTE — Telephone Encounter (Signed)
12/12/2012 RE: Bradley Holmes DOB: 1960-09-05 MRN: [id number]  Dear Dr Allyson Sabal,   We have scheduled the above patient for a colonoscopy. Our records show that he is on anticoagulation therapy.  Please advise as to whether the patient may come off his therapy of Plavix 5 days prior to the procedure, which is scheduled for 01/02/13. Please fax back/ or route your response to Vernia Buff, CMA at [phone].   Sincerely,  Vernia Buff

## 2012-12-12 NOTE — Progress Notes (Signed)
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 He is an insulin-dependent diabetic and has coronary artery disease for which he is status post drug-eluting stent placement by Dr.Berry in August 2012 to the circumflex artery. He has a normal LV function and he is on Plavix 75 mg daily. He had a recent visit with Dr.Berry.  He is here to discuss having a colonoscopy. Several years ago when he lived in [place], patient had an episode of food impaction which was relieved spontaneously and he subsequently underwent an upper endoscopy but did not require dilatation. Since then, he has intermittent solid food dysphagia less than once a month. He has not had any impaction or regurgitation recently. He did take some acid reducing agents but is not currently.   Past Medical History  Diagnosis Date  . Hypertension     2D ECHO, 10/10/2010 - EF >55%, normal  . Coronary artery disease     LEXISCAN, 01/05/2011 - defect seen in Basal Inferior, Mid Inferior, and Apical Lateral region(s)-consistent with infarct/scar, global LV systolic function mildly reduced, abnormal study  . Pseudoaneurysm     LEA DOPPLER, 11/01/2010 - right groin hematoma measuring 2.47x0.62x1.61 centimeters in greatest diameter  . Insulin dependent diabetes mellitus   . Hyperlipidemia    Past Surgical History  Procedure Laterality Date  . Cardiac catheterization  10/24/2010    CTI - 3030 Resolute drug-eluting stent, 3.35mm resulting reduction og a long CT to 0% residual with excellent flow.    reports that he has never smoked. He has never used smokeless tobacco. He reports that  drinks alcohol. He reports that he does not use illicit drugs. family history includes Diabetes in his mother and sister; Emphysema in his paternal grandfather; Heart disease (age of onset: 19) in his mother; Prostate cancer in his maternal grandfather; Thyroid  cancer in his brother. Allergies  Allergen Reactions  . Norvasc [Amlodipine Besylate] Other (See Comments)    Edema  . Sulfa Antibiotics Hives        Review of Systems:Positive for dysphagia to solids. Normal bowel habits. No rectal bleeding   The remainder of the 10 point ROS is negative except as outlined in H&P   Physical Exam: General appearance  Well developed, in no distress. Eyes- non icteric. HEENT nontraumatic, normocephalic. Mouth no lesions, tongue papillated, no cheilosis. Neck supple without adenopathy, thyroid not enlarged, no carotid bruits, no JVD. Lungs Clear to auscultation bilaterally. Cor normal S1, normal S2, regular rhythm, no murmur,  quiet precordium. Abdomen:  soft nontender with normoactive bowel sounds. No tenderness. Liver edge at costal margin.  Rectal: not done.  Extremities trace  pedal edema. Skin no lesions. Neurological alert and oriented x 3. Psychological normal mood and affect.  Assessment and Plan:  Problem #36 52 year old white male who is a good candidate for a screening colonoscopy. We have discussed the indications, the prep and the procedure. His insulin will be adjusted. We will ask Dr Allyson Sabal to approve holding Plavix for 5 days prior to the colonoscopy. Apparently, this was done one year ago for another procedure.  Problem #2 Gastroesophageal reflux and intermittent solid food dysphagia. I offered the patient an upper endoscopy and possible dilation but he feels that his symptoms are not bothersome and do not occur enough to warrant an upper endoscopy.   12/12/2012 Lina Sar

## 2012-12-15 ENCOUNTER — Encounter: Payer: Self-pay | Admitting: Internal Medicine

## 2012-12-19 ENCOUNTER — Telehealth: Payer: Self-pay | Admitting: Cardiovascular Disease

## 2012-12-19 ENCOUNTER — Encounter: Payer: Self-pay | Admitting: *Deleted

## 2012-12-19 NOTE — Telephone Encounter (Signed)
Left message with [facility] requesting that Dr Allyson Sabal respond to Plavix clearance letter for patient's upcoming procedure 01/02/13.

## 2012-12-19 NOTE — Telephone Encounter (Signed)
Informed Bradley Holmes the patient has been cleared, and she should be getting a note today.

## 2012-12-19 NOTE — Telephone Encounter (Signed)
Patient advised that Dr Allyson Sabal has okayed him holding his plavix 5 days prior to his procedure on 01/02/13. Patient verbalizes understanding. However, he states that he has cancelled his colonoscopy procedure until the beginning of 2015.

## 2012-12-19 NOTE — Telephone Encounter (Signed)
Still waiting on his clarence for Plavix-Scheduled for procedure ion 10-24-Please fax or send through Epic .

## 2013-01-02 ENCOUNTER — Encounter: Payer: BC Managed Care – PPO | Admitting: Internal Medicine

## 2013-02-11 ENCOUNTER — Other Ambulatory Visit: Payer: Self-pay | Admitting: Cardiovascular Disease

## 2013-02-11 NOTE — Telephone Encounter (Signed)
Rx was sent to pharmacy electronically. 

## 2013-06-24 ENCOUNTER — Emergency Department (HOSPITAL_COMMUNITY)
Admission: EM | Admit: 2013-06-24 | Discharge: 2013-06-24 | Disposition: A | Discharge status: Home / Self Care | Payer: BC Managed Care – PPO | Attending: Emergency Medicine | Admitting: Emergency Medicine

## 2013-06-24 ENCOUNTER — Emergency Department (HOSPITAL_COMMUNITY): Payer: BC Managed Care – PPO

## 2013-06-24 ENCOUNTER — Encounter (HOSPITAL_COMMUNITY): Payer: Self-pay | Admitting: Emergency Medicine

## 2013-06-24 DIAGNOSIS — R109 Unspecified abdominal pain: Secondary | ICD-10-CM

## 2013-06-24 DIAGNOSIS — R638 Other symptoms and signs concerning food and fluid intake: Secondary | ICD-10-CM | POA: Insufficient documentation

## 2013-06-24 DIAGNOSIS — Z9889 Other specified postprocedural states: Secondary | ICD-10-CM | POA: Insufficient documentation

## 2013-06-24 DIAGNOSIS — N200 Calculus of kidney: Secondary | ICD-10-CM | POA: Insufficient documentation

## 2013-06-24 DIAGNOSIS — Z7982 Long term (current) use of aspirin: Secondary | ICD-10-CM | POA: Insufficient documentation

## 2013-06-24 DIAGNOSIS — R319 Hematuria, unspecified: Secondary | ICD-10-CM

## 2013-06-24 DIAGNOSIS — R112 Nausea with vomiting, unspecified: Secondary | ICD-10-CM | POA: Insufficient documentation

## 2013-06-24 DIAGNOSIS — I10 Essential (primary) hypertension: Secondary | ICD-10-CM | POA: Insufficient documentation

## 2013-06-24 DIAGNOSIS — Z794 Long term (current) use of insulin: Secondary | ICD-10-CM | POA: Insufficient documentation

## 2013-06-24 DIAGNOSIS — I2510 Atherosclerotic heart disease of native coronary artery without angina pectoris: Secondary | ICD-10-CM | POA: Insufficient documentation

## 2013-06-24 DIAGNOSIS — R739 Hyperglycemia, unspecified: Secondary | ICD-10-CM

## 2013-06-24 DIAGNOSIS — D3500 Benign neoplasm of unspecified adrenal gland: Secondary | ICD-10-CM | POA: Insufficient documentation

## 2013-06-24 DIAGNOSIS — Z79899 Other long term (current) drug therapy: Secondary | ICD-10-CM | POA: Insufficient documentation

## 2013-06-24 DIAGNOSIS — N134 Hydroureter: Secondary | ICD-10-CM | POA: Insufficient documentation

## 2013-06-24 DIAGNOSIS — E119 Type 2 diabetes mellitus without complications: Secondary | ICD-10-CM

## 2013-06-24 DIAGNOSIS — Z7902 Long term (current) use of antithrombotics/antiplatelets: Secondary | ICD-10-CM | POA: Insufficient documentation

## 2013-06-24 LAB — URINALYSIS, ROUTINE W REFLEX MICROSCOPIC
BILIRUBIN URINE: NEGATIVE
Glucose, UA: >1000 mg/dL — AB
KETONES UR: NEGATIVE mg/dL
LEUKOCYTES UA: NEGATIVE
NITRITE: NEGATIVE
Protein, ur: NEGATIVE mg/dL
SPECIFIC GRAVITY, URINE: 1.023 (ref 1.005–1.030)
UROBILINOGEN UA: 0.2 mg/dL (ref 0.0–1.0)
pH: 7 (ref 5.0–8.0)

## 2013-06-24 LAB — BASIC METABOLIC PANEL
BUN: 21 mg/dL (ref 6–23)
CHLORIDE: 97 meq/L (ref 96–112)
CO2: 24 meq/L (ref 19–32)
CREATININE: 1.25 mg/dL (ref 0.50–1.35)
Calcium: 10.1 mg/dL (ref 8.4–10.5)
GFR calc non Af Amer: 65 mL/min — ABNORMAL LOW (ref >90)
GFR, EST AFRICAN AMERICAN: 75 mL/min — AB (ref >90)
Glucose, Bld: 278 mg/dL — ABNORMAL HIGH (ref 70–99)
POTASSIUM: 3.4 meq/L — AB (ref 3.7–5.3)
Sodium: 140 mEq/L (ref 137–147)

## 2013-06-24 LAB — URINE MICROSCOPIC-ADD ON

## 2013-06-24 MED ORDER — HYDROCODONE-ACETAMINOPHEN 5-325 MG PO TABS: 2.0000 | ORAL_TABLET | ORAL | Status: DC | PRN

## 2013-06-24 MED ORDER — ONDANSETRON 4 MG PO TBDP: ORAL_TABLET | ORAL | Status: DC

## 2013-06-24 MED ORDER — ONDANSETRON HCL 4 MG/2ML IJ SOLN
4.0000 mg | Freq: Once | INTRAMUSCULAR | Status: AC
  Administered 2013-06-24: 4 mg via INTRAVENOUS
  Filled 2013-06-24: qty 2

## 2013-06-24 MED ORDER — KETOROLAC TROMETHAMINE 30 MG/ML IJ SOLN
30.0000 mg | Freq: Once | INTRAMUSCULAR | Status: AC
  Administered 2013-06-24: 30 mg via INTRAVENOUS
  Filled 2013-06-24: qty 1

## 2013-06-24 MED ORDER — SODIUM CHLORIDE 0.9 % IV SOLN
Freq: Once | INTRAVENOUS | Status: AC
  Administered 2013-06-24: 11:00:00 via INTRAVENOUS

## 2013-06-24 MED ORDER — HYDROMORPHONE HCL PF 1 MG/ML IJ SOLN
1.0000 mg | Freq: Once | INTRAMUSCULAR | Status: AC
  Administered 2013-06-24: 1 mg via INTRAVENOUS
  Filled 2013-06-24: qty 1

## 2013-06-24 NOTE — ED Notes (Signed)
Pt states that he began having rt sided flank pain x 1 hr ago.  Denies vomiting or diarrhea.  Denies difficulty urinating.

## 2013-06-24 NOTE — ED Provider Notes (Signed)
CSN: [id number]     Arrival date & time 06/24/13  1010 History   First MD Initiated Contact with Patient 06/24/13 1025     Chief Complaint  Patient presents with  . Flank Pain     (Consider location/radiation/quality/duration/timing/severity/associated sxs/prior Treatment) HPI Comments: 53 yo male with CAD, HTN, DM presents with acute right flank pain since 1 hr PTA. No hx of similar, no stone hx.  No abdo surgery hx.  Non smoker.  Pain constant, non focal.  Mild nausea.  Nothing improves. Radiates to right groin.   Patient is a 53 y.o. male presenting with flank pain. The history is provided by the patient.  Flank Pain This is a new problem. Pertinent negatives include no chest pain, no abdominal pain, no headaches and no shortness of breath.    Past Medical History  Diagnosis Date  . Hypertension     2D ECHO, 10/10/2010 - EF >55%, normal  . Coronary artery disease     LEXISCAN, 01/05/2011 - defect seen in Basal Inferior, Mid Inferior, and Apical Lateral region(s)-consistent with infarct/scar, global LV systolic function mildly reduced, abnormal study  . Pseudoaneurysm     LEA DOPPLER, 11/01/2010 - right groin hematoma measuring 2.47x0.62x1.61 centimeters in greatest diameter  . Insulin dependent diabetes mellitus   . Hyperlipidemia    Past Surgical History  Procedure Laterality Date  . Cardiac catheterization  10/24/2010    [facility] - 3030 Resolute drug-eluting stent, 3.22m resulting reduction og a long CT to 0% residual with excellent flow.   Family History  Problem Relation Age of Onset  . Heart disease Mother 654   Stent placement  . Diabetes Mother   . Diabetes Sister   . Thyroid cancer Brother     2010  . Prostate cancer Maternal Grandfather   . Emphysema Paternal Grandfather    History  Substance Use Topics  . Smoking status: Never Smoker   . Smokeless tobacco: Never Used  . Alcohol Use: Yes     Comment: occassionally    Review of Systems  Constitutional:  Positive for appetite change. Negative for fever and chills.  HENT: Negative for congestion.   Eyes: Negative for visual disturbance.  Respiratory: Negative for shortness of breath.   Cardiovascular: Negative for chest pain.  Gastrointestinal: Positive for nausea and vomiting. Negative for abdominal pain.  Genitourinary: Positive for flank pain. Negative for dysuria.  Musculoskeletal: Negative for back pain, neck pain and neck stiffness.  Skin: Negative for rash.  Neurological: Negative for light-headedness and headaches.      Allergies  Norvasc and Sulfa antibiotics  Home Medications   Prior to Admission medications   Medication Sig Start Date End Date Taking? Authorizing Provider  Acetaminophen (TYLENOL PO) Take by mouth as needed.    Historical Provider, MD  aspirin 325 MG tablet Take 325 mg by mouth daily.    Historical Provider, MD  BD PEN NEEDLE NANO U/F 32G X 4 MM MISC  06/24/12   Historical Provider, MD  BYSTOLIC 10 MG tablet TAKE 1 TABLET EVERY EVENING 07/30/12   JLorretta Harp MD  clopidogrel (PLAVIX) 75 MG tablet Take 1 tablet (75 mg total) by mouth daily. 09/01/12   JLorretta Harp MD  Coenzyme Q10 (CO Q-10) 200 MG CAPS Take 1 tablet by mouth daily.    Historical Provider, MD  CRESTOR 5 MG tablet TAKE 1 TABLET BY MOUTH EVERY OTHER DAY 11/16/12   JLorretta Harp MD  glyBURIDE-metformin (GLUCOVANCE) 2.5-500 MG per  tablet Take 1 tablet by mouth. 2 tablets in morning and 1 tablet in the evening    Historical Provider, MD  INVOKANA 300 MG TABS Take 300 mg by mouth daily. 08/20/12   Historical Provider, MD  LANTUS SOLOSTAR 100 UNIT/ML SOPN 12units daily 06/24/12   Historical Provider, MD  MOVIPREP 100 G SOLR Take 1 kit (200 g total) by mouth once. 12/12/12   Lafayette Dragon, MD  NITROSTAT 0.4 MG SL tablet PLACE 1 TABLET UNDER TONGUE EVERY 5 MINS AS NEEDED FOR UP TO 3 DOSES 02/11/13   [name], MD  ONE TOUCH ULTRA TEST test strip  11/14/12   Historical Provider, MD   triamcinolone cream (KENALOG) 0.1 % as needed. 07/23/12   Historical Provider, MD  TRIBENZOR 40-10-25 MG TABS Take 1 tablet by mouth daily.  08/21/12   Historical Provider, MD   BP 159/76  Pulse 56  Temp(Src) 96.8 F (36 C) (Oral)  Resp 15  SpO2 100% Physical Exam  Nursing note and vitals reviewed. Constitutional: He is oriented to person, place, and time. He appears well-developed and well-nourished.  HENT:  Head: Normocephalic and atraumatic.  Dry mm  Eyes: Conjunctivae are normal. Right eye exhibits no discharge. Left eye exhibits no discharge.  Neck: Normal range of motion. Neck supple. No tracheal deviation present.  Cardiovascular: Normal rate and regular rhythm.   Pulmonary/Chest: Effort normal and breath sounds normal.  Abdominal: Soft. He exhibits no distension. There is no tenderness. There is no guarding.  Musculoskeletal: He exhibits tenderness (right mid flank). He exhibits no edema.  Neurological: He is alert and oriented to person, place, and time.  Skin: Skin is warm. No rash noted.  Psychiatric: He has a normal mood and affect.    ED Course  Procedures (including critical care time) Emergency Focused Ultrasound Exam Limited retroperitoneal ultrasound of kidneys  Performed and interpreted by Dr. Reather Converse Indication: flank pain Focused abdominal ultrasound with both kidneys imaged in transverse and longitudinal planes in real-time. Interpretation: moderate right hydronephrosis visualized.  No stones or cysts visualized  Images archived electronically  Labs Review Labs Reviewed  URINALYSIS, ROUTINE W REFLEX MICROSCOPIC - Abnormal; Notable for the following:    Glucose, UA >1000 (*)    Hgb urine dipstick SMALL (*)    All other components within normal limits  BASIC METABOLIC PANEL - Abnormal; Notable for the following:    Potassium 3.4 (*)    Glucose, Bld 278 (*)    GFR calc non Af Amer 65 (*)    GFR calc Af Amer 75 (*)    All other components within normal  limits  URINE MICROSCOPIC-ADD ON    Imaging Review Ct Abdomen Pelvis Wo Contrast  06/24/2013   CLINICAL DATA:  1 hr history of right flank discomfort without dysuria or GI symptoms.  EXAM: CT ABDOMEN AND PELVIS WITHOUT CONTRAST  TECHNIQUE: Multidetector CT imaging of the abdomen and pelvis was performed following the standard protocol without intravenous contrast.  COMPARISON:  None.  FINDINGS: There is mild right-sided hydronephrosis and hydroureter secondary to a 4 mm diameter stone at the right ureterovesicle junction. This demonstrated best on image 83. No other stones in the right kidney are demonstrated. There is increased density in the perinephric fat on the right. The left kidney exhibits no evidence of stones nor obstruction. The psoas musculature is normal in contour and density. The partially distended urinary bladder is normal in appearance. The prostate gland and seminal vesicles appear normal. There  is a shallow left inguinal hernia containing fat.  The appendix is normal in appearance. There is a 2 mm diameter appendicolith in its tip. The large and small bowel exhibit no evidence of obstruction or ileus or inflammation. The liver, gallbladder, pancreas, spleen, and partially distended stomach appear normal. There is an 11 mm diameter focal right adrenal mass with HU density positive +10. The left adrenal gland is normal in appearance. The caliber of the abdominal aorta is normal. The periaortic and pericaval regions are normal in appearance.  The lung bases are clear. The lumbar vertebral bodies are preserved in height. There is a focal area of sclerosis anteriorly in the body of L2. The bony pelvis exhibits no acute abnormality. S1 is transitional.  IMPRESSION: 1. There is moderate right-sided hydronephrosis and hydroureter secondary to a 4 mm diameter stone at the right UVJ. No stones are demonstrated elsewhere on the right nor on the left. 2. There is no evidence of acute appendicitis nor  other acute bowel abnormality. There is a shallow left inguinal hernia. 3. There is an 11 mm right adrenal mass with Hounsfield measurement of +10 most compatible with an adenoma. 4. There is no acute hepatobiliary abnormality.   Electronically Signed   By: David  Martinique   On: 06/24/2013 11:50     EKG Interpretation None      MDM   Final diagnoses:  Adrenal adenoma  Hematuria  Right nephrolithiasis  Hydroureter on right  Right flank pain  Hyperglycemia  Diabetes   Concern for kidney stone. Bedside US moderate hydronephrosis. Toradol with minimal pain relief.  Fluids and dilaudid. Pt improved on recheck, pain controlled. CT showed 4 mm stone and similar hydro seen on [place], adrenal mass 11 mm, likely adenoma, pt will need close fup outpt.   Fup outpt with urology.  Discussed close fup for adenoma.  Results and differential diagnosis were discussed with the patient. Close follow up outpatient was discussed, patient comfortable with the plan.   Filed Vitals:   06/24/13 1018 06/24/13 1150  BP: 163/75 159/76  Pulse: 58 56  Temp: 96.8 F (36 C)   TempSrc: Oral   Resp: 23 15  SpO2: 100% 100%      Mariea Clonts, MD 06/24/13 1208

## 2013-06-24 NOTE — Discharge Instructions (Signed)
Take ibuprofen 600 mg every 6 hrs for pain. For severe pain take norco or vicodin however realize they have the potential for addiction and it can make you sleepy and has tylenol in it.  No operating machinery while taking. Strain your urine. Follow up with primary doctor for adrenal adenoma for further evaluation. See urology. If you were given medicines take as directed.  If you are on coumadin or contraceptives realize their levels and effectiveness is altered by many different medicines.  If you have any reaction (rash, tongues swelling, other) to the medicines stop taking and see a physician.   Have blood sugar monitored and checked as it is running high. Please follow up as directed and return to the ER or see a physician for new or worsening symptoms.  Thank you. Filed Vitals:   06/24/13 1018 06/24/13 1150  BP: 163/75 159/76  Pulse: 58 56  Temp: 96.8 F (36 C)   TempSrc: Oral   Resp: 23 15  SpO2: 100% 100%

## 2013-09-14 ENCOUNTER — Other Ambulatory Visit: Payer: Self-pay | Admitting: Cardiovascular Disease

## 2013-09-14 NOTE — Telephone Encounter (Signed)
Rx was sent to pharmacy electronically. 

## 2013-10-28 ENCOUNTER — Other Ambulatory Visit: Payer: Self-pay | Admitting: Internal Medicine

## 2013-10-28 DIAGNOSIS — N5089 Other specified disorders of the male genital organs: Secondary | ICD-10-CM

## 2013-10-29 ENCOUNTER — Ambulatory Visit
Admission: RE | Admit: 2013-10-29 | Discharge: 2013-10-29 | Disposition: A | Discharge status: Home / Self Care | Payer: BC Managed Care – PPO | Source: Ambulatory Visit | Attending: Internal Medicine | Admitting: Internal Medicine

## 2013-10-29 DIAGNOSIS — N5089 Other specified disorders of the male genital organs: Secondary | ICD-10-CM

## 2013-11-26 ENCOUNTER — Encounter: Payer: Self-pay | Admitting: *Deleted

## 2013-12-03 ENCOUNTER — Encounter: Payer: Self-pay | Admitting: Internal Medicine

## 2013-12-03 ENCOUNTER — Ambulatory Visit (INDEPENDENT_AMBULATORY_CARE_PROVIDER_SITE_OTHER): Payer: BC Managed Care – PPO | Admitting: Internal Medicine

## 2013-12-03 VITALS — BP 130/66 | HR 60 | Ht 66.5 in | Wt 227.5 lb

## 2013-12-03 DIAGNOSIS — D689 Coagulation defect, unspecified: Secondary | ICD-10-CM

## 2013-12-03 DIAGNOSIS — Z1211 Encounter for screening for malignant neoplasm of colon: Secondary | ICD-10-CM

## 2013-12-03 MED ORDER — MOVIPREP 100 G PO SOLR: 1.0000 | Freq: Once | ORAL | Status: DC

## 2013-12-03 NOTE — Progress Notes (Signed)
Bradley Holmes 10-08-1960 [id number]  Note: This dictation was prepared with Dragon digital system. Any transcriptional errors that result from this procedure are unintentional.   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 who is here to discuss having a screening colonoscopy. We saw him one year ago for the same reason and he decided to wait until this time. He is a diabetic. He has coronary artery disease and a drug-eluting stent placed by Dr. Gwenlyn Found in August 2014 to the circumflex artery. He has normal left ventricular function. He at that time had solid food dysphagia and had a food impaction in the remote past but currently he denies any dysphagia, heartburn or any upper GI symptoms. We were initially planning to do both endoscopy and colonoscopy but he denies any upper GI symptoms at this time.    Past Medical History  Diagnosis Date  . Hypertension     2D ECHO, 10/10/2010 - EF >55%, normal  . Coronary artery disease     LEXISCAN, 01/05/2011 - defect seen in Basal Inferior, Mid Inferior, and Apical Lateral region(s)-consistent with infarct/scar, global LV systolic function mildly reduced, abnormal study  . Pseudoaneurysm     LEA DOPPLER, 11/01/2010 - right groin hematoma measuring 2.47x0.62x1.61 centimeters in greatest diameter  . Insulin dependent diabetes mellitus   . Hyperlipidemia   . GERD (gastroesophageal reflux disease)     Past Surgical History  Procedure Laterality Date  . Cardiac catheterization  10/24/2010    [facility]I - 3030 Resolute drug-eluting stent, 3.2mm resulting reduction og a long CT to 0% residual with excellent flow.    Allergies  Allergen Reactions  . Norvasc [Amlodipine Besylate] Other (See Comments)    Edema  . Sulfa Antibiotics Hives    Family history and social history have been reviewed.  Review of Systems: Denies dysphagia, odynophagia or heartburn. Normal bowel habits. No family history of  colon cancer  The remainder of the 10 point ROS is negative except as outlined in the H&P  Physical Exam: General Appearance Well developed, in no distress, mildly overweight Eyes  Non icteric  HEENT  Non traumatic, normocephalic  Mouth No lesion, tongue papillated, no cheilosis Neck Supple without adenopathy, thyroid not enlarged, no carotid bruits, no JVD Lungs Clear to auscultation bilaterally COR Normal S1, normal S2, regular rhythm, no murmur, quiet precordium Abdomen protuberant soft, nontender. Normal active bowel sounds  Rectal not done  Extremities  No pedal edema Skin No lesions Neurological Alert and oriented x 3 Psychological Normal mood and affect  Assessment and Plan:   Problem #25 53 year old white male who is an appropriate candidate for a screening colonoscopy. He is a diabetic and he has been on Plavix for coronary artery disease with placement of a stent. We have obtained permission from Dr. Gwenlyn Found after his last visit to hold Plavix for 5 days prior to the procedure and possibly longer if we find polyps. We will adjust his insulin requirements during prep for colonoscopy which can be scheduled for November 2015. We have discussed the sedation , the procedure itself and the prep.    Bradley Holmes 12/03/2013

## 2013-12-03 NOTE — Patient Instructions (Addendum)
You have been scheduled for a colonoscopy. Please follow written instructions given to you at your visit today.  Please pick up your prep kit at the pharmacy within the next 1-3 days. If you use inhalers (even only as needed), please bring them with you on the day of your procedure. Your physician has requested that you go to www.startemmi.com and enter the access code given to you at your visit today. This web site gives a general overview about your procedure. However, you should still follow specific instructions given to you by our office regarding your preparation for the procedure.  CC: Dr Quay Burow, Dr Dagmar Hait

## 2013-12-09 ENCOUNTER — Other Ambulatory Visit: Payer: Self-pay | Admitting: Cardiovascular Disease

## 2013-12-09 NOTE — Telephone Encounter (Signed)
Rx was sent to pharmacy electronically. Patient has OV 12/18/13

## 2013-12-11 ENCOUNTER — Other Ambulatory Visit: Payer: Self-pay | Admitting: Cardiovascular Disease

## 2013-12-11 NOTE — Telephone Encounter (Signed)
Rx was sent to pharmacy electronically. Patient has office visit 10/9

## 2013-12-14 ENCOUNTER — Encounter: Payer: Self-pay | Admitting: Internal Medicine

## 2013-12-18 ENCOUNTER — Ambulatory Visit (INDEPENDENT_AMBULATORY_CARE_PROVIDER_SITE_OTHER): Payer: BC Managed Care – PPO | Admitting: Cardiovascular Disease

## 2013-12-18 ENCOUNTER — Encounter: Payer: Self-pay | Admitting: Cardiovascular Disease

## 2013-12-18 VITALS — BP 150/80 | HR 53 | Ht 66.0 in | Wt 226.0 lb

## 2013-12-18 DIAGNOSIS — E785 Hyperlipidemia, unspecified: Secondary | ICD-10-CM

## 2013-12-18 DIAGNOSIS — I2510 Atherosclerotic heart disease of native coronary artery without angina pectoris: Secondary | ICD-10-CM

## 2013-12-18 DIAGNOSIS — I10 Essential (primary) hypertension: Secondary | ICD-10-CM

## 2013-12-18 DIAGNOSIS — I2583 Coronary atherosclerosis due to lipid rich plaque: Secondary | ICD-10-CM

## 2013-12-18 NOTE — Assessment & Plan Note (Signed)
Controlled on current medications 

## 2013-12-18 NOTE — Progress Notes (Signed)
12/18/2013 Bradley Holmes   [birth year]  [id number]  Primary Physician Tivis Ringer, MD Primary Cardiologist: Bradley Harp MD Bradley Holmes   HPI:  The patient is a 53 year old mildly overweight single Caucasian male with no children whom I last saw in the office 09/01/12. 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 He was initially referred because of an abnormal EKG with risk factors that included diabetes, hypertension and hyperlipidemia as well as family history. He was getting some chest pain and dyspnea. His EKG showed poor R-wave progression with Q-waves, a left anterior fascicular block. Myoview stress test showed inferolateral scar with mild peri/infarct ischemia, and because of this I performed cardiac catheterization on him 10/24/2010 revealing an occluded circumflex in the mid AV groove, 50% mid LAD and proximal PDA stenosis with normal LV function. I was ultimately able to cross his chronically occluded circumflex and stented him with a resolute 3.0 x 30 drug-eluting stent. Since that time, he had done well. A followup Myoview showed improved perfusion to the lateral wall. Since I saw him a year ago he has done remarkably well. He denies chest pain but does get some dyspnea on exertion.    Current Outpatient Prescriptions  Medication Sig Dispense Refill  . Acetaminophen (TYLENOL PO) Take by mouth as needed.      Marland Kitchen aspirin 325 MG tablet Take 325 mg by mouth daily.      . BD PEN NEEDLE NANO U/F 32G X 4 MM MISC       . BYSTOLIC 10 MG tablet TAKE 1 TABLET EVERY EVENING  30 tablet  2  . clopidogrel (PLAVIX) 75 MG tablet TAKE 1 TABLET BY MOUTH EVERY DAY  90 tablet  0  . Coenzyme Q10 (CO Q-10) 200 MG CAPS Take 1 tablet by mouth daily.      . CRESTOR 5 MG tablet TAKE 1 TABLET BY MOUTH EVERY OTHER DAY  15 tablet  1  . Insulin Aspart (NOVOLOG FLEXPEN [place]) Inject 10 Units into the skin 3  (three) times daily with meals. Short term quick acting      . INVOKANA 300 MG TABS Take 300 mg by mouth daily.      Marland Kitchen LANTUS SOLOSTAR 100 UNIT/ML SOPN 30 Units.       Marland Kitchen MOVIPREP 100 G SOLR Take 1 kit (200 g total) by mouth once.  1 kit  0  . NITROSTAT 0.4 MG SL tablet PLACE 1 TABLET UNDER TONGUE EVERY 5 MINS AS NEEDED FOR UP TO 3 DOSES  25 tablet  3  . ONE TOUCH ULTRA TEST test strip       . triamcinolone cream (KENALOG) 0.1 % as needed.      [name] 40-10-25 MG TABS Take 1 tablet by mouth daily.        No current facility-administered medications for this visit.    Allergies  Allergen Reactions  . Norvasc [Amlodipine Besylate] Other (See Comments)    Edema  . Sulfa Antibiotics Hives    History   Social History  . Marital Status: Single    Spouse Name: N/A    Number of Children: N/A  . Years of Education: N/A   Occupational History  . Not on file.   Social History Main Topics  . Smoking status: Never Smoker   . Smokeless tobacco: Never Used  . Alcohol Use: Yes     Comment:  occassionally  . Drug Use: No  . Sexual Activity: Not on file   Other Topics Concern  . Not on file   Social History Narrative  . No narrative on file     Review of Systems: General: negative for chills, fever, night sweats or weight changes.  Cardiovascular: negative for chest pain, dyspnea on exertion, edema, orthopnea, palpitations, paroxysmal nocturnal dyspnea or shortness of breath Dermatological: negative for rash Respiratory: negative for cough or wheezing Urologic: negative for hematuria Abdominal: negative for nausea, vomiting, diarrhea, bright red blood per rectum, melena, or hematemesis Neurologic: negative for visual changes, syncope, or dizziness All other systems reviewed and are otherwise negative except as noted above.    Blood pressure 150/80, pulse 53, height _0  (1.676 m), weight 226 lb (102.513 kg).  General appearance: alert and no distress Neck: no adenopathy,  no carotid bruit, no JVD, supple, symmetrical, trachea midline and thyroid not enlarged, symmetric, no tenderness/mass/nodules Lungs: clear to auscultation bilaterally Heart: regular rate and rhythm, S1, S2 normal, no murmur, click, rub or gallop Extremities: extremities normal, atraumatic, no cyanosis or edema  EKG post pericardiectomy with poor R-wave progression and left anterior fascicular block  ASSESSMENT AND PLAN:   Coronary artery disease History of CAD status post circumflex chronic total occlusion revascularization with a resolute premalignant millimeter by 30 mm long drug-eluting stent 10/24/10. He did have moderate disease in his LAD and PDA with normal LV function. A followup Myoview showed improved perfusion to the lateral wall. He denies chest pain or shortness of breath.  Essential hypertension Controlled on current medications  Hyperlipidemia On statin therapy followed by his PCP      Bradley Harp MD [facility], [facility] 12/18/2013 10:54 AM

## 2013-12-18 NOTE — Assessment & Plan Note (Signed)
On statin therapy followed by his PCP 

## 2013-12-18 NOTE — Patient Instructions (Signed)
Your physician wants you to follow-up in: 1 year with Dr Berry. You will receive a reminder letter in the mail two months in advance. If you don't receive a letter, please call our office to schedule the follow-up appointment.  

## 2013-12-18 NOTE — Assessment & Plan Note (Signed)
History of CAD status post circumflex chronic total occlusion revascularization with a resolute premalignant millimeter by 30 mm long drug-eluting stent 10/24/10. He did have moderate disease in his LAD and PDA with normal LV function. A followup Myoview showed improved perfusion to the lateral wall. He denies chest pain or shortness of breath.

## 2014-02-10 ENCOUNTER — Ambulatory Visit (AMBULATORY_SURGERY_CENTER): Payer: BC Managed Care – PPO | Admitting: Internal Medicine

## 2014-02-10 ENCOUNTER — Encounter: Payer: Self-pay | Admitting: Internal Medicine

## 2014-02-10 VITALS — BP 127/78 | HR 49 | Temp 97.7°F | Resp 19 | Ht 66.0 in | Wt 227.0 lb

## 2014-02-10 DIAGNOSIS — Z1211 Encounter for screening for malignant neoplasm of colon: Secondary | ICD-10-CM

## 2014-02-10 DIAGNOSIS — D124 Benign neoplasm of descending colon: Secondary | ICD-10-CM

## 2014-02-10 DIAGNOSIS — D122 Benign neoplasm of ascending colon: Secondary | ICD-10-CM

## 2014-02-10 LAB — GLUCOSE, CAPILLARY
Glucose-Capillary: 195 mg/dL — ABNORMAL HIGH (ref 70–99)
Glucose-Capillary: 199 mg/dL — ABNORMAL HIGH (ref 70–99)

## 2014-02-10 MED ORDER — SODIUM CHLORIDE 0.9 % IV SOLN: 500.0000 mL | INTRAVENOUS | Status: DC

## 2014-02-10 NOTE — Patient Instructions (Signed)
RESUME YOUR PLAVIX IN AM,02/11/14.   YOU HAD AN ENDOSCOPIC PROCEDURE TODAY AT [facility]: Refer to the procedure report that was given to you for any specific questions about what was found during the examination.  If the procedure report does not answer your questions, please call your gastroenterologist to clarify.  If you requested that your care partner not be given the details of your procedure findings, then the procedure report has been included in a sealed envelope for you to review at your convenience later.  YOU SHOULD EXPECT: Some feelings of bloating in the abdomen. Passage of more gas than usual.  Walking can help get rid of the air that was put into your GI tract during the procedure and reduce the bloating. If you had a lower endoscopy (such as a colonoscopy or flexible sigmoidoscopy) you may notice spotting of blood in your stool or on the toilet paper. If you underwent a bowel prep for your procedure, then you may not have a normal bowel movement for a few days.  DIET: Your first meal following the procedure should be a light meal and then it is ok to progress to your normal diet.  A half-sandwich or bowl of soup is an example of a good first meal.  Heavy or fried foods are harder to digest and may make you feel nauseous or bloated.  Likewise meals heavy in dairy and vegetables can cause extra gas to form and this can also increase the bloating.  Drink plenty of fluids but you should avoid alcoholic beverages for 24 hours.  ACTIVITY: Your care partner should take you home directly after the procedure.  You should plan to take it easy, moving slowly for the rest of the day.  You can resume normal activity the day after the procedure however you should NOT DRIVE or use heavy machinery for 24 hours (because of the sedation medicines used during the test).    SYMPTOMS TO REPORT IMMEDIATELY: A gastroenterologist can be reached at any hour.  During normal business hours,  8:30 AM to 5:00 PM Monday through Friday, call [phone].  After hours and on weekends, please call the GI answering service at [phone] who will take a message and have the physician on call contact you.   Following lower endoscopy (colonoscopy or flexible sigmoidoscopy):  Excessive amounts of blood in the stool  Significant tenderness or worsening of abdominal pains  Swelling of the abdomen that is new, acute  Fever of 100F or higher  FOLLOW UP: If any biopsies were taken you will be contacted by phone or by letter within the next 1-3 weeks.  Call your gastroenterologist if you have not heard about the biopsies in 3 weeks.  Our staff will call the home number listed on your records the next business day following your procedure to check on you and address any questions or concerns that you may have at that time regarding the information given to you following your procedure. This is a courtesy call and so if there is no answer at the home number and we have not heard from you through the emergency physician on call, we will assume that you have returned to your regular daily activities without incident.  SIGNATURES/CONFIDENTIALITY: You and/or your care partner have signed paperwork which will be entered into your electronic medical record.  These signatures attest to the fact that that the information above on your After Visit Summary has been reviewed and is understood.  Full responsibility of the confidentiality of this discharge information lies with you and/or your care-partner.

## 2014-02-10 NOTE — Op Note (Signed)
[place]  [address] [employer] [address]. [place], [id number]   COLONOSCOPY PROCEDURE REPORT  PATIENT: Fidencio, Duddy  MR#: [id number] BIRTHDATE: June 15, 1960 , 24  yrs. old GENDER: male ENDOSCOPIST: Lafayette Dragon, MD REFERRED QM:[id number] Avva, M.D. PROCEDURE DATE:  02/10/2014 PROCEDURE:   Colonoscopy with cold biopsy polypectomy First Screening Colonoscopy - Avg.  risk and is 50 yrs.  old or older Yes.  Prior Negative Screening - Now for repeat screening. N/A  History of Adenoma - Now for follow-up colonoscopy & has been > or = to 3 yrs.  N/A  Polyps Removed Today? Yes. ASA CLASS:   Class II INDICATIONS:average risk for colon cancer. MEDICATIONS: Monitored anesthesia care and Propofol 200 mg IV  DESCRIPTION OF PROCEDURE:   After the risks benefits and alternatives of the procedure were thoroughly explained, informed consent was obtained.  The digital rectal exam revealed no abnormalities of the rectum.   The LB [id number]  endoscope was introduced through the anus and advanced to the cecum, which was identified by both the appendix and ileocecal valve. No adverse events experienced.   The quality of the prep was good, using MoviPrep  The instrument was then slowly withdrawn as the colon was fully examined.      COLON FINDINGS: Three firm sessile polyps measuring 5 mm in size were found.in ascending x1 and descending x 2 colon.  A polypectomy was performed with cold forceps.  The resection was complete, the polyp tissue was completely retrieved and sent to histology. Retroflexed views revealed no abnormalities. The time to cecum=4 minutes 10 seconds.  Withdrawal time=9 minutes 34 seconds.  The scope was withdrawn and the procedure completed. COMPLICATIONS: There were no immediate complications.  ENDOSCOPIC IMPRESSION: Three sessile polyps were found; polypectomy was performed with cold forceps mild diverticulosis of the left colon   RECOMMENDATIONS: 1.   Await pathology results 2.  Resume Plavix tomorrow High fiber diet Recall colonoscopy pending path report  eSigned:  Lafayette Dragon, MD 02/10/2014 9:49 AM   cc:   PATIENT NAME:  Sair, Faulcon MR#: [id number]

## 2014-02-10 NOTE — Progress Notes (Signed)
Pt stable to RR 

## 2014-02-10 NOTE — Progress Notes (Signed)
Called to room to assist during endoscopic procedure.  Patient ID and intended procedure confirmed with present staff. Received instructions for my participation in the procedure from the performing physician.  

## 2014-02-11 ENCOUNTER — Telehealth: Payer: Self-pay | Admitting: *Deleted

## 2014-02-11 NOTE — Telephone Encounter (Signed)
  Follow up Call-  Call back number 02/10/2014  Post procedure Call Back phone  # [phone]  Permission to leave phone message Yes     Patient questions:  Do you have a fever, pain , or abdominal swelling? No. Pain Score  0 *  Have you tolerated food without any problems? Yes.    Have you been able to return to your normal activities? No.  Do you have any questions about your discharge instructions: Diet   No. Medications  No. Follow up visit  No.  Do you have questions or concerns about your Care? No.  Actions: * If pain score is 4 or above: No action needed, pain <4.

## 2014-02-15 ENCOUNTER — Encounter: Payer: Self-pay | Admitting: Internal Medicine

## 2014-03-04 ENCOUNTER — Other Ambulatory Visit: Payer: Self-pay | Admitting: Cardiovascular Disease

## 2014-03-04 NOTE — Telephone Encounter (Signed)
Rx refill sent to patient pharmacy   

## 2014-03-10 ENCOUNTER — Other Ambulatory Visit: Payer: Self-pay | Admitting: Cardiovascular Disease

## 2014-03-10 NOTE — Telephone Encounter (Signed)
[facility] refilled #15 with 9 on 03/04/14 take QOD

## 2014-03-10 NOTE — Telephone Encounter (Signed)
E-SENT RX #90 DAY REFILL X 3

## 2014-03-24 ENCOUNTER — Emergency Department (HOSPITAL_COMMUNITY): Payer: BC Managed Care – PPO

## 2014-03-24 ENCOUNTER — Encounter (HOSPITAL_COMMUNITY): Payer: Self-pay | Admitting: Emergency Medicine

## 2014-03-24 ENCOUNTER — Emergency Department (HOSPITAL_COMMUNITY)
Admission: EM | Admit: 2014-03-24 | Discharge: 2014-03-24 | Disposition: A | Discharge status: Home / Self Care | Payer: BC Managed Care – PPO | Attending: Emergency Medicine | Admitting: Emergency Medicine

## 2014-03-24 DIAGNOSIS — R55 Syncope and collapse: Secondary | ICD-10-CM

## 2014-03-24 DIAGNOSIS — Z794 Long term (current) use of insulin: Secondary | ICD-10-CM | POA: Diagnosis not present

## 2014-03-24 DIAGNOSIS — R61 Generalized hyperhidrosis: Secondary | ICD-10-CM | POA: Insufficient documentation

## 2014-03-24 DIAGNOSIS — Z79899 Other long term (current) drug therapy: Secondary | ICD-10-CM | POA: Diagnosis not present

## 2014-03-24 DIAGNOSIS — I2510 Atherosclerotic heart disease of native coronary artery without angina pectoris: Secondary | ICD-10-CM | POA: Insufficient documentation

## 2014-03-24 DIAGNOSIS — E119 Type 2 diabetes mellitus without complications: Secondary | ICD-10-CM | POA: Insufficient documentation

## 2014-03-24 DIAGNOSIS — I10 Essential (primary) hypertension: Secondary | ICD-10-CM | POA: Insufficient documentation

## 2014-03-24 DIAGNOSIS — H9191 Unspecified hearing loss, right ear: Secondary | ICD-10-CM | POA: Diagnosis not present

## 2014-03-24 DIAGNOSIS — R42 Dizziness and giddiness: Secondary | ICD-10-CM | POA: Diagnosis present

## 2014-03-24 DIAGNOSIS — Z7982 Long term (current) use of aspirin: Secondary | ICD-10-CM | POA: Insufficient documentation

## 2014-03-24 DIAGNOSIS — E785 Hyperlipidemia, unspecified: Secondary | ICD-10-CM | POA: Insufficient documentation

## 2014-03-24 DIAGNOSIS — Z9889 Other specified postprocedural states: Secondary | ICD-10-CM | POA: Insufficient documentation

## 2014-03-24 DIAGNOSIS — Z8719 Personal history of other diseases of the digestive system: Secondary | ICD-10-CM | POA: Insufficient documentation

## 2014-03-24 LAB — BASIC METABOLIC PANEL
Anion gap: 10 (ref 5–15)
BUN: 24 mg/dL — AB (ref 6–23)
CHLORIDE: 102 meq/L (ref 96–112)
CO2: 27 mmol/L (ref 19–32)
CREATININE: 1.17 mg/dL (ref 0.50–1.35)
Calcium: 9.6 mg/dL (ref 8.4–10.5)
GFR calc non Af Amer: 70 mL/min — ABNORMAL LOW (ref >90)
GFR, EST AFRICAN AMERICAN: 81 mL/min — AB (ref >90)
Glucose, Bld: 199 mg/dL — ABNORMAL HIGH (ref 70–99)
Potassium: 3.3 mmol/L — ABNORMAL LOW (ref 3.5–5.1)
Sodium: 139 mmol/L (ref 135–145)

## 2014-03-24 LAB — URINALYSIS, ROUTINE W REFLEX MICROSCOPIC
BILIRUBIN URINE: NEGATIVE
Glucose, UA: >1000 mg/dL — AB
Hgb urine dipstick: NEGATIVE
KETONES UR: NEGATIVE mg/dL
Leukocytes, UA: NEGATIVE
NITRITE: NEGATIVE
PROTEIN: NEGATIVE mg/dL
Specific Gravity, Urine: 1.024 (ref 1.005–1.030)
UROBILINOGEN UA: 0.2 mg/dL (ref 0.0–1.0)
pH: 5 (ref 5.0–8.0)

## 2014-03-24 LAB — CBC WITH DIFFERENTIAL/PLATELET
BASOS PCT: 0 % (ref 0–1)
Basophils Absolute: 0 10*3/uL (ref 0.0–0.1)
Eosinophils Absolute: 0.1 10*3/uL (ref 0.0–0.7)
Eosinophils Relative: 1 % (ref 0–5)
HCT: 44.4 % (ref 39.0–52.0)
Hemoglobin: 15.8 g/dL (ref 13.0–17.0)
LYMPHS PCT: 13 % (ref 12–46)
Lymphs Abs: 1.4 10*3/uL (ref 0.7–4.0)
MCH: 30.2 pg (ref 26.0–34.0)
MCHC: 35.6 g/dL (ref 30.0–36.0)
MCV: 84.7 fL (ref 78.0–100.0)
MONO ABS: 0.5 10*3/uL (ref 0.1–1.0)
MONOS PCT: 5 % (ref 3–12)
Neutro Abs: 8.8 10*3/uL — ABNORMAL HIGH (ref 1.7–7.7)
Neutrophils Relative %: 81 % — ABNORMAL HIGH (ref 43–77)
Platelets: 192 10*3/uL (ref 150–400)
RBC: 5.24 MIL/uL (ref 4.22–5.81)
RDW: 13 % (ref 11.5–15.5)
WBC: 10.8 10*3/uL — AB (ref 4.0–10.5)

## 2014-03-24 LAB — URINE MICROSCOPIC-ADD ON

## 2014-03-24 LAB — I-STAT TROPONIN, ED: Troponin i, poc: 0 ng/mL (ref 0.00–0.08)

## 2014-03-24 MED ORDER — SODIUM CHLORIDE 0.9 % IV SOLN: INTRAVENOUS | Status: DC

## 2014-03-24 NOTE — ED Notes (Signed)
PT is coming from Work. PT started having pressure in Rt ear yesterday Pt denies any Pain. Today while sitting in a meeting and started feeling dizzy and diaphoretic. PT denies any postion  changes. PT denies any dizziness on arrival. CBG with EMS was 179.

## 2014-03-24 NOTE — ED Provider Notes (Addendum)
Patient is developed patient electrolyte CSN: [id number]     Arrival date & time 03/24/14  1253 History   First MD Initiated Contact with Patient 03/24/14 1310     Chief Complaint  Patient presents with  . Dizziness     (Consider location/radiation/quality/duration/timing/severity/associated sxs/prior Treatment) Patient is a 54 y.o. male presenting with dizziness. The history is provided by the patient and the EMS personnel.  Dizziness Associated symptoms: hearing loss   Associated symptoms: no chest pain, no headaches and no shortness of breath    the patient brought in by EMS. Patient had a meeting around 01/20/1929 he was sitting started to feel dizzy lightheaded and got diaphoretic no chest pain no shortness of breath no syncope no true vertigo. Patient started yesterday with some right ear pressure feeling. Still present. Has some decreased hearing in that ear. Patient currently asymptomatic.  Past Medical History  Diagnosis Date  . Hypertension     2D ECHO, 10/10/2010 - EF >55%, normal  . Coronary artery disease     LEXISCAN, 01/05/2011 - defect seen in Basal Inferior, Mid Inferior, and Apical Lateral region(s)-consistent with infarct/scar, global LV systolic function mildly reduced, abnormal study  . Pseudoaneurysm     LEA DOPPLER, 11/01/2010 - right groin hematoma measuring 2.47x0.62x1.61 centimeters in greatest diameter  . Insulin dependent diabetes mellitus   . Hyperlipidemia   . GERD (gastroesophageal reflux disease)    Past Surgical History  Procedure Laterality Date  . Cardiac catheterization  10/24/2010    CTI - 3030 Resolute drug-eluting stent, 3.18mm resulting reduction og a long CT to 0% residual with excellent flow.   Family History  Problem Relation Age of Onset  . Heart disease Mother 2    Stent placement  . Diabetes Mother   . Diabetes Sister   . Thyroid cancer Brother     2010  . Prostate cancer Maternal Grandfather   . Emphysema Paternal Grandfather     History  Substance Use Topics  . Smoking status: Never Smoker   . Smokeless tobacco: Never Used  . Alcohol Use: Yes     Comment: occassionally    Review of Systems  Constitutional: Positive for diaphoresis. Negative for fever.  HENT: Positive for ear pain and hearing loss. Negative for congestion and ear discharge.   Eyes: Negative for visual disturbance.  Respiratory: Negative for shortness of breath.   Cardiovascular: Negative for chest pain.  Gastrointestinal: Negative for abdominal pain.  Genitourinary: Negative for dysuria.  Musculoskeletal: Negative for myalgias, back pain and neck pain.  Skin: Negative for rash.  Neurological: Positive for dizziness and light-headedness. Negative for syncope, weakness, numbness and headaches.  Hematological: Does not bruise/bleed easily.  Psychiatric/Behavioral: Negative for confusion.      Allergies  Norvasc and Sulfa antibiotics  Home Medications   Prior to Admission medications   Medication Sig Start Date End Date Taking? Authorizing Provider  Acetaminophen (TYLENOL PO) Take by mouth as needed.   Yes Historical Provider, MD  aspirin 325 MG tablet Take 325 mg by mouth daily.   Yes Historical Provider, MD  BD PEN NEEDLE NANO U/F 32G X 4 MM MISC  06/24/12  Yes Historical Provider, MD  BYSTOLIC 10 MG tablet TAKE 1 TABLET EVERY EVENING 07/30/12  Yes [name], MD  clopidogrel (PLAVIX) 75 MG tablet TAKE 1 TABLET BY MOUTH EVERY DAY 03/10/14  Yes [name], MD  Coenzyme Q10 (CO Q-10) 200 MG CAPS Take 1 tablet by mouth daily.   Yes  Historical Provider, MD  CRESTOR 5 MG tablet TAKE 1 TABLET BY MOUTH EVERY OTHER DAY 03/04/14  Yes [name], MD  Insulin Aspart (NOVOLOG FLEXPEN [place]) Inject 10 Units into the skin 3 (three) times daily with meals. Short term quick acting   Yes Historical Provider, MD  INVOKANA 300 MG TABS Take 300 mg by mouth daily. 08/20/12  Yes Historical Provider, MD  KLOR-CON M20 20 MEQ tablet  02/08/14   Yes Historical Provider, MD  LANTUS SOLOSTAR 100 UNIT/ML SOPN Inject 30 Units into the skin every morning.  06/24/12  Yes Historical Provider, MD  MOVIPREP 100 G SOLR  12/03/13  Yes Historical Provider, MD  ONE TOUCH ULTRA TEST test strip  11/14/12  Yes Historical Provider, MD  triamcinolone cream (KENALOG) 0.1 % as needed. 07/23/12  Yes Historical Provider, MD  TRIBENZOR 40-10-25 MG TABS Take 1 tablet by mouth daily.  08/21/12  Yes Historical Provider, MD  NITROSTAT 0.4 MG SL tablet PLACE 1 TABLET UNDER TONGUE EVERY 5 MINS AS NEEDED FOR UP TO 3 DOSES Patient not taking: Reported on 02/10/2014 02/11/13   [name], MD   BP 166/73 mmHg  Pulse 56  Temp(Src) 98.1 F (36.7 C) (Oral)  Resp 10  SpO2 97% Physical Exam  Constitutional: He is oriented to person, place, and time. He appears well-developed and well-nourished. No distress.  HENT:  Head: Normocephalic and atraumatic.  Right Ear: External ear normal.  Left Ear: External ear normal.  Mouth/Throat: Oropharynx is clear and moist.  Eyes: Conjunctivae and EOM are normal. Pupils are equal, round, and reactive to light.  Neck: Normal range of motion. Neck supple.  Cardiovascular: Normal rate, regular rhythm and normal heart sounds.   No murmur heard. Pulmonary/Chest: Effort normal and breath sounds normal. No respiratory distress.  Abdominal: Soft. Bowel sounds are normal. There is no tenderness.  Musculoskeletal: Normal range of motion.  Neurological: He is alert and oriented to person, place, and time. No cranial nerve deficit. He exhibits normal muscle tone. Coordination normal.  Skin: Skin is warm. No rash noted.  Nursing note and vitals reviewed.   ED Course  Procedures (including critical care time) Labs Review Labs Reviewed  CBC WITH DIFFERENTIAL - Abnormal; Notable for the following:    WBC 10.8 (*)    Neutrophils Relative % 81 (*)    Neutro Abs 8.8 (*)    All other components within normal limits  BASIC METABOLIC PANEL  - Abnormal; Notable for the following:    Potassium 3.3 (*)    Glucose, Bld 199 (*)    BUN 24 (*)    GFR calc non Af Amer 70 (*)    GFR calc Af Amer 81 (*)    All other components within normal limits  URINALYSIS, ROUTINE W REFLEX MICROSCOPIC - Abnormal; Notable for the following:    Glucose, UA >1000 (*)    All other components within normal limits  URINE MICROSCOPIC-ADD ON  I-STAT TROPOININ, ED   Results for orders placed or performed during the hospital encounter of 03/24/14  CBC with Differential  Result Value Ref Range   WBC 10.8 (H) 4.0 - 10.5 K/uL   RBC 5.24 4.22 - 5.81 MIL/uL   Hemoglobin 15.8 13.0 - 17.0 g/dL   HCT 44.4 39.0 - 52.0 %   MCV 84.7 78.0 - 100.0 fL   MCH 30.2 26.0 - 34.0 pg   MCHC 35.6 30.0 - 36.0 g/dL   RDW 13.0 11.5 - 15.5 %  Platelets 192 150 - 400 K/uL   Neutrophils Relative % 81 (H) 43 - 77 %   Neutro Abs 8.8 (H) 1.7 - 7.7 K/uL   Lymphocytes Relative 13 12 - 46 %   Lymphs Abs 1.4 0.7 - 4.0 K/uL   Monocytes Relative 5 3 - 12 %   Monocytes Absolute 0.5 0.1 - 1.0 K/uL   Eosinophils Relative 1 0 - 5 %   Eosinophils Absolute 0.1 0.0 - 0.7 K/uL   Basophils Relative 0 0 - 1 %   Basophils Absolute 0.0 0.0 - 0.1 K/uL  Basic metabolic panel  Result Value Ref Range   Sodium 139 135 - 145 mmol/L   Potassium 3.3 (L) 3.5 - 5.1 mmol/L   Chloride 102 96 - 112 mEq/L   CO2 27 19 - 32 mmol/L   Glucose, Bld 199 (H) 70 - 99 mg/dL   BUN 24 (H) 6 - 23 mg/dL   Creatinine, Ser 1.17 0.50 - 1.35 mg/dL   Calcium 9.6 8.4 - 10.5 mg/dL   GFR calc non Af Amer 70 (L) >90 mL/min   GFR calc Af Amer 81 (L) >90 mL/min   Anion gap 10 5 - 15  Urinalysis, Routine w reflex microscopic  Result Value Ref Range   Color, Urine YELLOW YELLOW   APPearance CLEAR CLEAR   Specific Gravity, Urine 1.024 1.005 - 1.030   pH 5.0 5.0 - 8.0   Glucose, UA >1000 (A) NEGATIVE mg/dL   Hgb urine dipstick NEGATIVE NEGATIVE   Bilirubin Urine NEGATIVE NEGATIVE   Ketones, ur NEGATIVE NEGATIVE mg/dL    Protein, ur NEGATIVE NEGATIVE mg/dL   Urobilinogen, UA 0.2 0.0 - 1.0 mg/dL   Nitrite NEGATIVE NEGATIVE   Leukocytes, UA NEGATIVE NEGATIVE  Urine microscopic-add on  Result Value Ref Range   Squamous Epithelial / LPF RARE RARE  I-Stat Troponin, ED (not at [facility])  Result Value Ref Range   Troponin i, poc 0.00 0.00 - 0.08 ng/mL   Comment 3             Imaging Review Dg Chest 2 View  03/24/2014   CLINICAL DATA:  Chest pain and dizziness  EXAM: CHEST  2 VIEW  COMPARISON:  None  FINDINGS: The heart size and mediastinal contours are within normal limits. Both lungs are clear. Calcified granuloma identified within the right upper lobe. Mild degenerative disc disease within the thoracic spine.  IMPRESSION: No active cardiopulmonary disease.   Electronically Signed   By: Kerby Moors M.D.   On: 03/24/2014 14:15   Ct Head Wo Contrast  03/24/2014   CLINICAL DATA:  Dizziness, lightheadedness and diaphoresis. Right ear hearing loss.  EXAM: CT HEAD WITHOUT CONTRAST  TECHNIQUE: Contiguous axial images were obtained from the base of the skull through the vertex without intravenous contrast.  COMPARISON:  None.  FINDINGS: There is no evidence of acute intracranial hemorrhage, mass lesion, brain edema or extra-axial fluid collection. The ventricles and subarachnoid spaces are appropriately sized for age. There is no CT evidence of acute cortical infarction. Early intracranial vascular calcifications are noted.  The visualized paranasal sinuses, mastoid air cells and middle ears are clear. The calvarium is intact.  IMPRESSION: No acute intracranial findings or explanation for the patient's symptoms.   Electronically Signed   By: Camie Patience M.D.   On: 03/24/2014 15:21     EKG Interpretation   Date/Time:  Wednesday March 24 2014 12:55:18 EST Ventricular Rate:  58 PR Interval:  206 QRS Duration:  118 QT Interval:  448 QTC Calculation: 440 R Axis:   -57 Text Interpretation:  Sinus rhythm Borderline  prolonged PR interval Left  anterior fascicular block Left ventricular hypertrophy Anterior infarct,  old Borderline T abnormalities, inferior leads No significant change since  last tracing Confirmed by Nikolaos Maddocks  MD, [place] [id number]) on 03/24/2014  1:35:34 PM      MDM   Final diagnoses:  Dizziness  Near syncope    Patient had around 01/20/1929 was sitting in a meeting he started feeling dizzy no room spinning. Did get diaphoretic. No chest pain no shortness of breath. Blood sugar there was 179 by EMS. EKG by EMS had no acute changes. EKG here without any acute changes. As stated no room spinning not consistent with vertigo. Patient does have a cardiac history with a stent. Patient's cardiologist is Dr. Gwenlyn Found. Patient will undergo near-syncope workup. Based on EKG and chest x-ray no evidence of pneumonia no evidence pulmonary edema. No acute changes on EKG. Patient without any tachycardia not hypoxic. Patient also with complaint of pressure in the right ear yesterday both tympanic membranes are normal on exam.    Fredia Sorrow, MD 03/24/14 1428   Patient's labs are back no significant and her maladies other than some mild hypokalemia. No evidence of an acute cardiac event. Head CT was negative as well. Chest x-ray was negative. We'll have patient follow-up with his regular doctor return for any new or worse symptoms. No indication for admission.  Fredia Sorrow, MD 03/24/14 1544

## 2014-03-24 NOTE — Discharge Instructions (Signed)
Workup for the lightheadedness and dizziness is negative. Negative point to follow-up with your record Dr. Work note provided to be off the rest of the week. Return for any new or worse symptoms to include chest pain or actually passing out.

## 2014-05-18 ENCOUNTER — Other Ambulatory Visit: Payer: Self-pay | Admitting: Cardiovascular Disease

## 2014-05-18 NOTE — Telephone Encounter (Signed)
Rx(s) sent to pharmacy electronically.  

## 2014-12-01 ENCOUNTER — Other Ambulatory Visit: Payer: Self-pay | Admitting: Otolaryngology

## 2014-12-01 DIAGNOSIS — H9190 Unspecified hearing loss, unspecified ear: Secondary | ICD-10-CM

## 2014-12-01 DIAGNOSIS — R42 Dizziness and giddiness: Secondary | ICD-10-CM

## 2014-12-07 ENCOUNTER — Ambulatory Visit
Admission: RE | Admit: 2014-12-07 | Discharge: 2014-12-07 | Disposition: A | Discharge status: Home / Self Care | Payer: BC Managed Care – PPO | Source: Ambulatory Visit | Attending: Otolaryngology | Admitting: Otolaryngology

## 2014-12-07 DIAGNOSIS — R42 Dizziness and giddiness: Secondary | ICD-10-CM

## 2014-12-07 DIAGNOSIS — H9190 Unspecified hearing loss, unspecified ear: Secondary | ICD-10-CM

## 2014-12-07 MED ORDER — GADOBENATE DIMEGLUMINE 529 MG/ML IV SOLN
20.0000 mL | Freq: Once | INTRAVENOUS | Status: AC | PRN
  Administered 2014-12-07: 20 mL via INTRAVENOUS

## 2014-12-21 ENCOUNTER — Telehealth: Payer: Self-pay | Admitting: Cardiovascular Disease

## 2014-12-21 NOTE — Telephone Encounter (Signed)
Received records from [facility] for appointment with Dr Gwenlyn Found on 12/28/14.  Records given to [facility] (medical records) for Dr Kennon Holter schedule on 12/28/14. lp

## 2014-12-22 ENCOUNTER — Telehealth: Payer: Self-pay

## 2014-12-22 NOTE — Telephone Encounter (Signed)
OK to interrupt plavix for dental procedure

## 2014-12-22 NOTE — Telephone Encounter (Signed)
Requesting surgical clearance:   1. Type of surgery: Periodontal problems that may require surgical therapy  2. Surgeon: Dr. Severiano Gilbert, DDS  3. Surgical date: pending clearance  4. Medications that need to be held: Plavix  5. CAD: Yes     6. I will defer to: Dr. Gwenlyn Found

## 2014-12-23 ENCOUNTER — Encounter: Payer: Self-pay | Admitting: Cardiovascular Disease

## 2014-12-23 NOTE — Telephone Encounter (Signed)
Pt clearance faxed to Dr. Geralynn Ochs office

## 2014-12-28 ENCOUNTER — Ambulatory Visit (INDEPENDENT_AMBULATORY_CARE_PROVIDER_SITE_OTHER): Payer: BC Managed Care – PPO | Admitting: Cardiovascular Disease

## 2014-12-28 ENCOUNTER — Encounter: Payer: Self-pay | Admitting: Cardiovascular Disease

## 2014-12-28 VITALS — BP 152/74 | HR 50 | Ht 66.0 in | Wt 242.1 lb

## 2014-12-28 DIAGNOSIS — I2510 Atherosclerotic heart disease of native coronary artery without angina pectoris: Secondary | ICD-10-CM

## 2014-12-28 DIAGNOSIS — I2583 Coronary atherosclerosis due to lipid rich plaque: Secondary | ICD-10-CM

## 2014-12-28 DIAGNOSIS — R011 Cardiac murmur, unspecified: Secondary | ICD-10-CM | POA: Diagnosis not present

## 2014-12-28 DIAGNOSIS — I10 Essential (primary) hypertension: Secondary | ICD-10-CM | POA: Diagnosis not present

## 2014-12-28 DIAGNOSIS — E785 Hyperlipidemia, unspecified: Secondary | ICD-10-CM

## 2014-12-28 NOTE — Assessment & Plan Note (Signed)
History of CAD status post occluded circumflex PCI and stenting using a resolute 3 mm x 30 mm drug-eluting stent 10/24/10. He did have 50% PDA and LAD stenosis with normal LV function. Subsequent to that procedure a follow-up Myoview showed improved perfusion in his lateral wall. He denies chest pain or shortness of breath.

## 2014-12-28 NOTE — Assessment & Plan Note (Signed)
History of hypertension with blood pressure measurements at 152/74. He is on Tribenzor and diastolic. Continue current meds at current dosing

## 2014-12-28 NOTE — Progress Notes (Signed)
12/28/2014 Bradley Holmes   September 05, 1960  [id number]  Primary Physician Tivis Ringer, MD Primary Cardiologist: [name] MD Bradley Holmes   HPI:  The patient is a 54 year old mildly overweight single Caucasian male with no children whom I last saw in the office 12/18/13. 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 He was initially referred because of an abnormal EKG with risk factors that included diabetes, hypertension and hyperlipidemia as well as family history. He was getting some chest pain and dyspnea. His EKG showed poor R-wave progression with Q-waves, a left anterior fascicular block. Myoview stress test showed inferolateral scar with mild peri/infarct ischemia, and because of this I performed cardiac catheterization on him 10/24/2010 revealing an occluded circumflex in the mid AV groove, 50% mid LAD and proximal PDA stenosis with normal LV function. I was ultimately able to cross his chronically occluded circumflex and stented him with a resolute 3.0 x 30 drug-eluting stent. Since that time, he had done well. A followup Myoview showed improved perfusion to the lateral wall. Since I saw him a year ago he has done remarkably well. He denies chest pain but does get some dyspnea on exertion. He has gained 14 pounds and admits to dietary indiscretion and not exercising.   Current Outpatient Prescriptions  Medication Sig Dispense Refill  . Acetaminophen (TYLENOL PO) Take by mouth as needed.    Marland Kitchen aspirin 325 MG tablet Take 325 mg by mouth daily.    . BD PEN NEEDLE NANO U/F 32G X 4 MM MISC     . BYSTOLIC 10 MG tablet TAKE 1 TABLET EVERY EVENING 30 tablet 2  . clopidogrel (PLAVIX) 75 MG tablet TAKE 1 TABLET BY MOUTH EVERY DAY 90 tablet 3  . Coenzyme Q10 (CO Q-10) 200 MG CAPS Take 1 tablet by mouth daily.    . CRESTOR 5 MG tablet TAKE 1 TABLET BY MOUTH EVERY OTHER DAY 15 tablet 9  . Insulin  Aspart (NOVOLOG FLEXPEN ) Inject 16 Units into the skin 3 (three) times daily with meals. Short term quick acting    . INVOKANA 300 MG TABS Take 300 mg by mouth daily.    Marland Kitchen KLOR-CON M20 20 MEQ tablet Take 20 mEq by mouth once.     Marland Kitchen LANTUS SOLOSTAR 100 UNIT/ML SOPN Inject 30 Units into the skin every morning.     Marland Kitchen MOVIPREP 100 G SOLR     . nitroGLYCERIN (NITROSTAT) 0.4 MG SL tablet Place 1 tablet (0.4 mg total) under the tongue every 5 (five) minutes as needed for chest pain. 25 tablet 2  . ONE TOUCH ULTRA TEST test strip     . ONETOUCH DELICA LANCETS 43K MISC Apply 1 strip topically 3 (three) times daily.  8  . triamcinolone cream (KENALOG) 0.1 % as needed.    [name] 40-10-25 MG TABS Take 1 tablet by mouth daily.      No current facility-administered medications for this visit.    Allergies  Allergen Reactions  . Norvasc [Amlodipine Besylate] Other (See Comments)    Edema  . Sulfa Antibiotics Hives    Social History   Social History  . Marital Status: Single    Spouse Name: N/A  . Number of Children: N/A  . Years of Education: N/A   Occupational History  . Not on file.   Social History Main Topics  . Smoking status: Never Smoker   .  Smokeless tobacco: Never Used  . Alcohol Use: Yes     Comment: occassionally  . Drug Use: No  . Sexual Activity: Not on file   Other Topics Concern  . Not on file   Social History Narrative     Review of Systems: General: negative for chills, fever, night sweats or weight changes.  Cardiovascular: negative for chest pain, dyspnea on exertion, edema, orthopnea, palpitations, paroxysmal nocturnal dyspnea or shortness of breath Dermatological: negative for rash Respiratory: negative for cough or wheezing Urologic: negative for hematuria Abdominal: negative for nausea, vomiting, diarrhea, bright red blood per rectum, melena, or hematemesis Neurologic: negative for visual changes, syncope, or dizziness All other systems reviewed  and are otherwise negative except as noted above.    Blood pressure 152/74, pulse 50, height 5\' 6"  (1.676 m), weight 242 lb 1.6 oz (109.816 kg).  General appearance: alert and no distress Neck: no adenopathy, no carotid bruit, no JVD, supple, symmetrical, trachea midline and thyroid not enlarged, symmetric, no tenderness/mass/nodules Lungs: clear to auscultation bilaterally Heart: soft outflow tract murmur and diastolic murmur Extremities: extremities normal, atraumatic, no cyanosis or edema  EKG sinus bradycardia 50 with left anterior fascicular block. I personally reviewed this EKG. There was also poor R-wave progression  ASSESSMENT AND PLAN:   Hyperlipidemia History of hyperlipidemia on Crestor followed by his PCP  Essential hypertension History of hypertension with blood pressure measurements at 152/74. He is on Tribenzor and diastolic. Continue current meds at current dosing  Coronary artery disease History of CAD status post occluded circumflex PCI and stenting using a resolute 3 mm x 30 mm drug-eluting stent 10/24/10. He did have 50% PDA and LAD stenosis with normal LV function. Subsequent to that procedure a follow-up Myoview showed improved perfusion in his lateral wall. He denies chest pain or shortness of breath.      [name] MD FACP,FACC,FAHA, [facility] 12/28/2014 4:59 PM

## 2014-12-28 NOTE — Patient Instructions (Signed)
Medication Instructions:  Your physician recommends that you continue on your current medications as directed. Please refer to the Current Medication list given to you today.   Labwork: I will get your lab work from your Primary Care Physician.  Testing/Procedures: Your physician has requested that you have an echocardiogram. Echocardiography is a painless test that uses sound waves to create images of your heart. It provides your doctor with information about the size and shape of your heart and how well your heart's chambers and valves are working. This procedure takes approximately one hour. There are no restrictions for this procedure.   Follow-Up: Your physician wants you to follow-up in: 12 months with Dr. Gwenlyn Found. You will receive a reminder letter in the mail two months in advance. If you don't receive a letter, please call our office to schedule the follow-up appointment.   Any Other Special Instructions Will Be Listed Below (If Applicable).

## 2014-12-28 NOTE — Assessment & Plan Note (Signed)
History of hyperlipidemia on Crestor followed by his PCP 

## 2015-01-04 ENCOUNTER — Other Ambulatory Visit: Payer: Self-pay

## 2015-01-04 ENCOUNTER — Ambulatory Visit (HOSPITAL_COMMUNITY): Payer: BC Managed Care – PPO | Attending: Interventional Cardiology

## 2015-01-04 DIAGNOSIS — E669 Obesity, unspecified: Secondary | ICD-10-CM | POA: Diagnosis not present

## 2015-01-04 DIAGNOSIS — Z6839 Body mass index (BMI) 39.0-39.9, adult: Secondary | ICD-10-CM | POA: Diagnosis not present

## 2015-01-04 DIAGNOSIS — I517 Cardiomegaly: Secondary | ICD-10-CM | POA: Diagnosis not present

## 2015-01-04 DIAGNOSIS — I5189 Other ill-defined heart diseases: Secondary | ICD-10-CM | POA: Diagnosis not present

## 2015-01-04 DIAGNOSIS — I071 Rheumatic tricuspid insufficiency: Secondary | ICD-10-CM | POA: Insufficient documentation

## 2015-01-04 DIAGNOSIS — E785 Hyperlipidemia, unspecified: Secondary | ICD-10-CM | POA: Diagnosis not present

## 2015-01-04 DIAGNOSIS — I10 Essential (primary) hypertension: Secondary | ICD-10-CM | POA: Insufficient documentation

## 2015-01-04 DIAGNOSIS — R011 Cardiac murmur, unspecified: Secondary | ICD-10-CM | POA: Diagnosis present

## 2015-01-13 ENCOUNTER — Encounter: Payer: Self-pay | Admitting: Cardiovascular Disease

## 2015-03-04 ENCOUNTER — Other Ambulatory Visit: Payer: Self-pay | Admitting: Cardiovascular Disease

## 2015-03-04 NOTE — Telephone Encounter (Signed)
Rx request sent to pharmacy.  

## 2015-07-19 ENCOUNTER — Other Ambulatory Visit: Payer: Self-pay | Admitting: Cardiovascular Disease

## 2015-07-20 NOTE — Telephone Encounter (Signed)
Rx has been sent to the pharmacy electronically. ° °

## 2015-08-19 IMAGING — CT CT HEAD W/O CM
3 series · 18 of 30 positions shown, 20 images · non-contrast
Comparison: None.

CLINICAL DATA: Dizziness, lightheadedness and diaphoresis. Right
ear hearing loss.

EXAM:
CT HEAD WITHOUT CONTRAST
TECHNIQUE: Contiguous axial images were obtained from the base of the skull
through the vertex without intravenous contrast.

[Series 3: head 5.0 h30s · axial · 0.48mm/px · z∈[-177,-57]mm · 7 of 33 slices shown, 9 images (1 of 2)]
[im 5/33  brain]
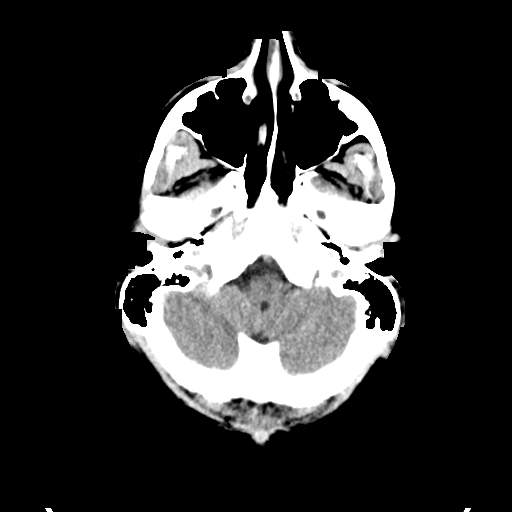
[im 5/33  bone]
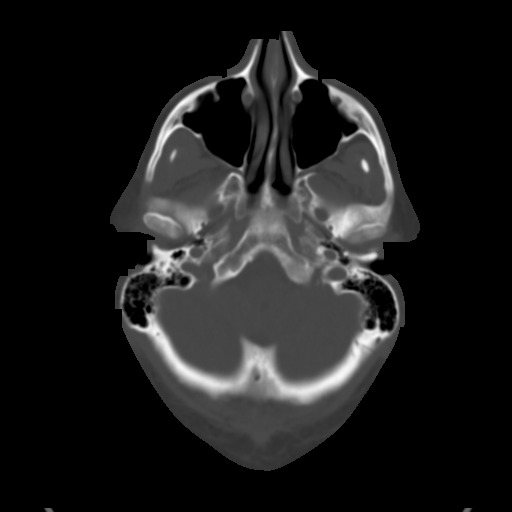
[im 9/33  brain]
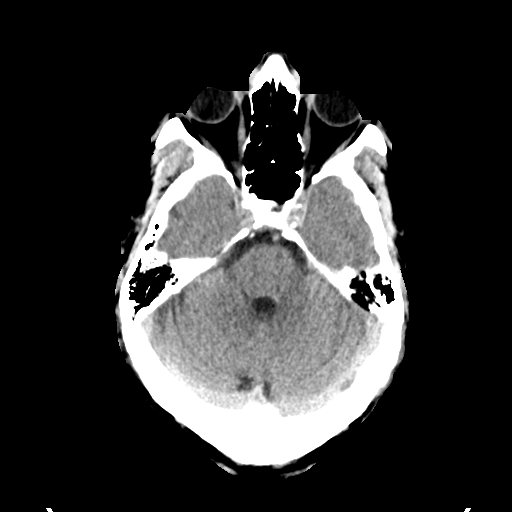
[im 13/33  brain]
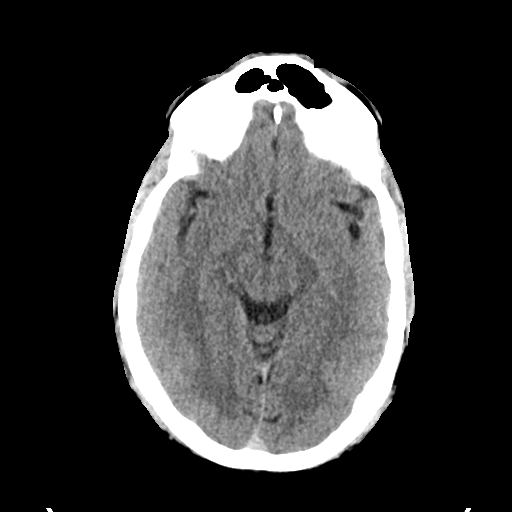
[im 17/33  brain]
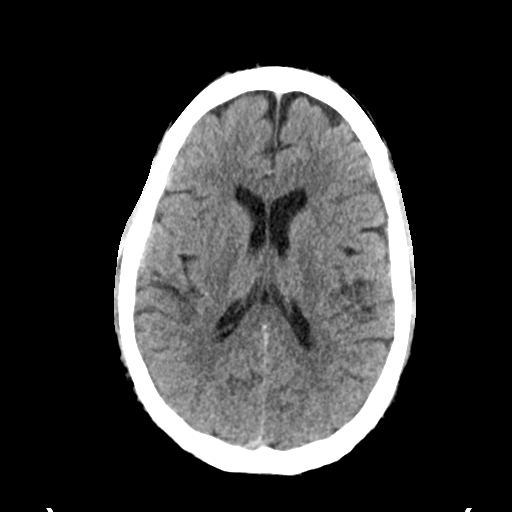
[im 21/33  brain]
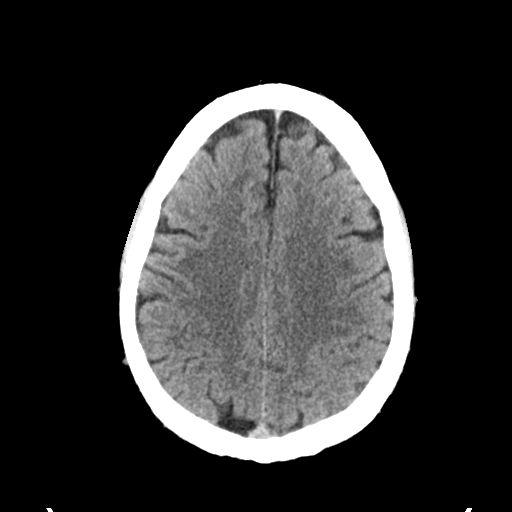
[im 21/33  bone]
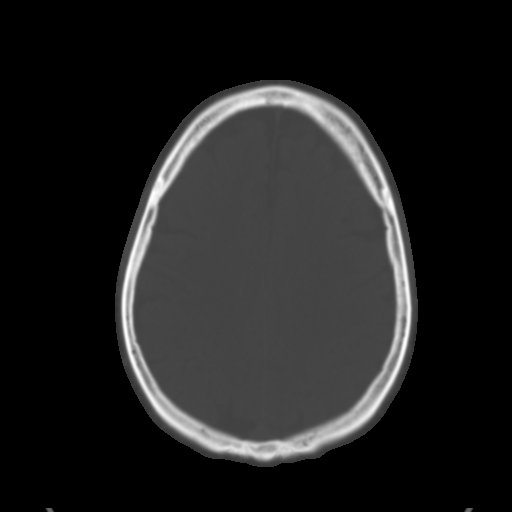
[im 25/33  brain]
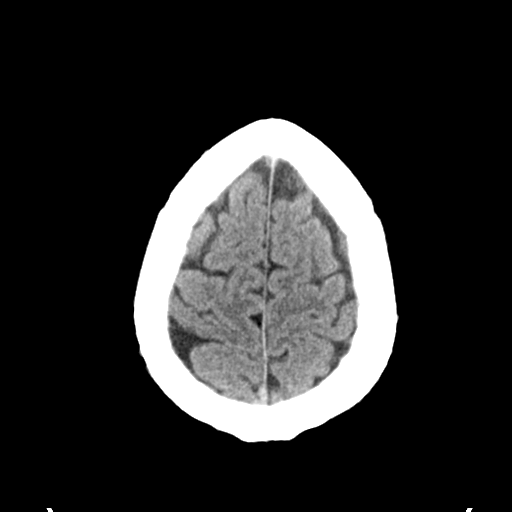
[im 29/33  brain]
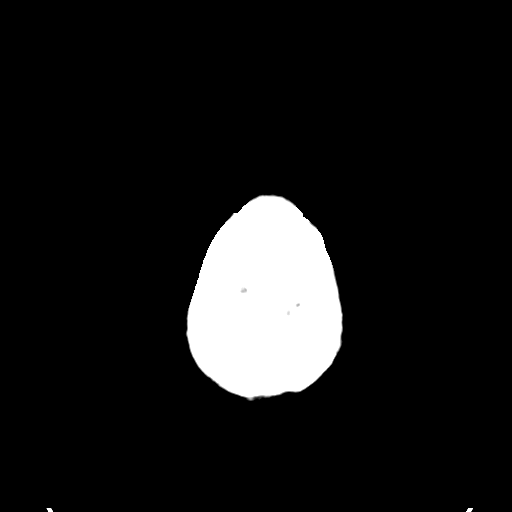

[Series 5: head 5.0 h30s · axial · 0.45mm/px · z∈[-130,-85]mm · 3 of 24 slices shown (2 of 2)]
[im 5/24  brain]
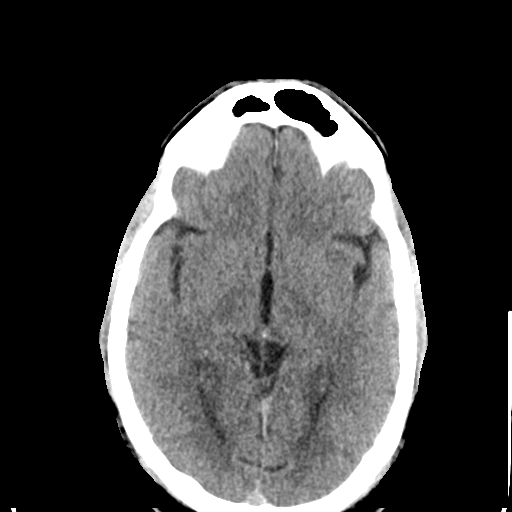
[im 10/24  brain]
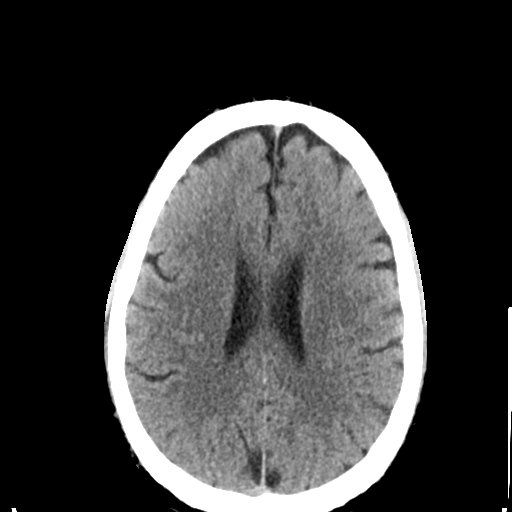
[im 14/24  brain]
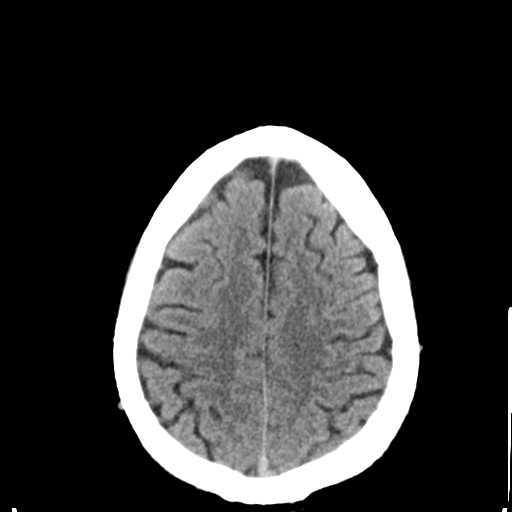

[Series 6: head 2.0 h70h · axial · 0.45mm/px · z∈[-142,-46]mm · 8 of 58 slices shown]
[im 5/58  brain]
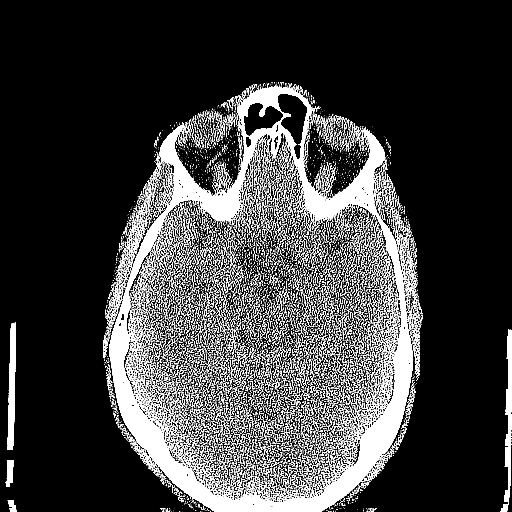
[im 13/58  brain]
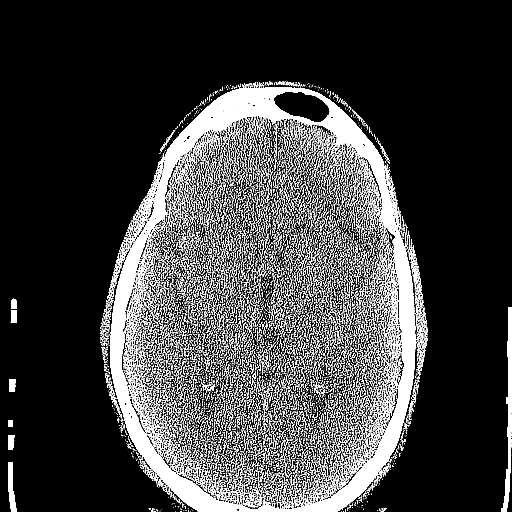
[im 21/58  brain]
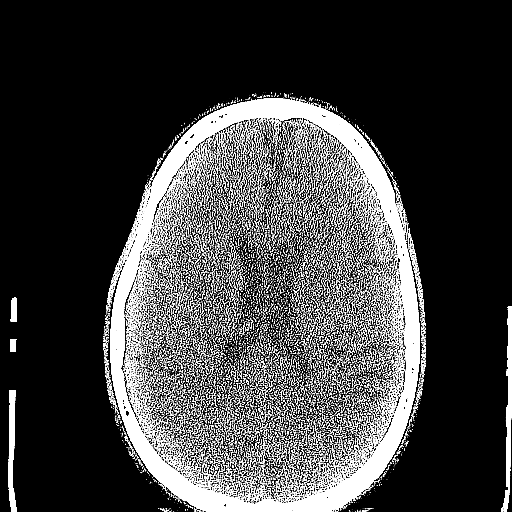
[im 25/58  brain]
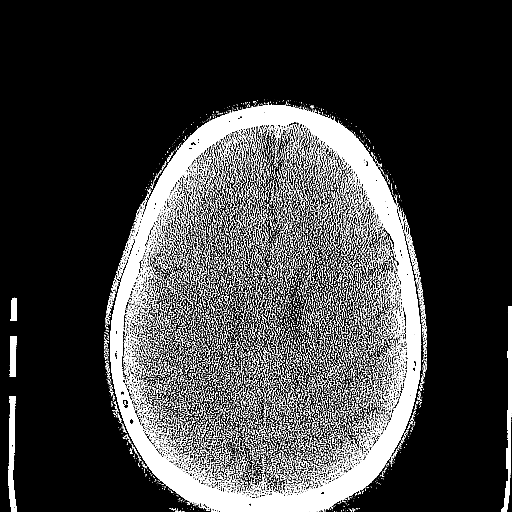
[im 33/58  brain]
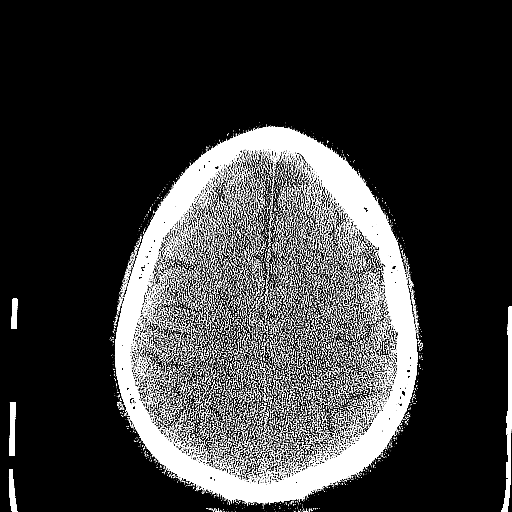
[im 37/58  brain]
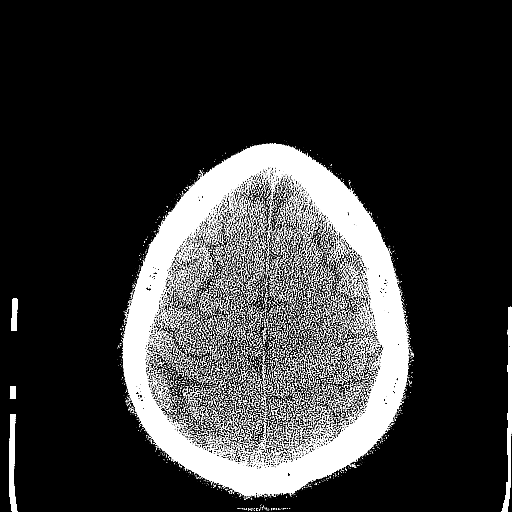
[im 45/58  brain]
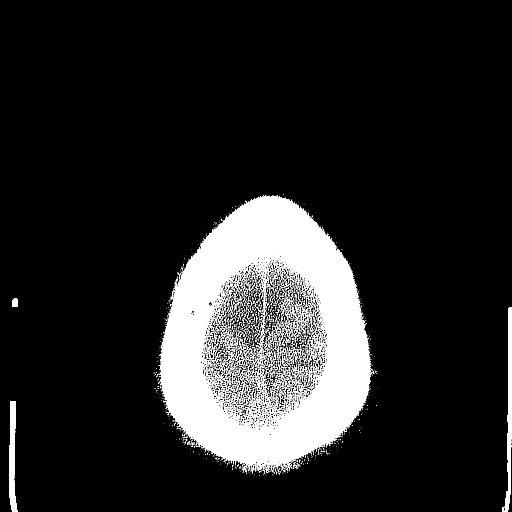
[im 53/58  brain]
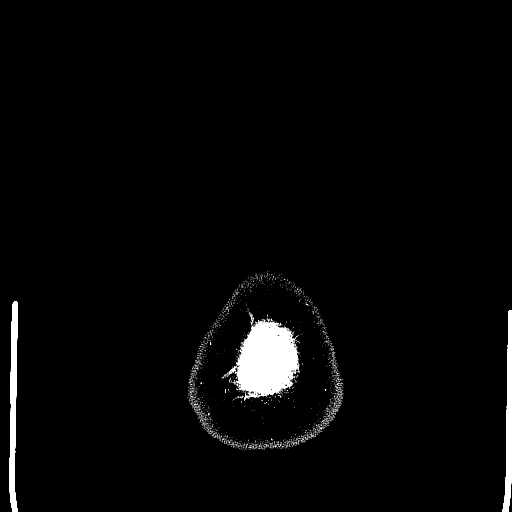

[18 of 30 positions shown; findings below may reference images not displayed]

FINDINGS: There is no evidence of acute intracranial hemorrhage, mass lesion,
brain edema or extra-axial fluid collection. The ventricles and
subarachnoid spaces are appropriately sized for age. There is no CT
evidence of acute cortical infarction. Early intracranial vascular
calcifications are noted.

The visualized paranasal sinuses, mastoid air cells and middle ears
are clear. The calvarium is intact.
IMPRESSION: No acute intracranial findings or explanation for the patient's
symptoms.

## 2016-01-04 ENCOUNTER — Encounter: Payer: Self-pay | Admitting: Cardiovascular Disease

## 2016-01-04 ENCOUNTER — Ambulatory Visit (INDEPENDENT_AMBULATORY_CARE_PROVIDER_SITE_OTHER): Payer: BC Managed Care – PPO | Admitting: Cardiovascular Disease

## 2016-01-04 VITALS — BP 140/74 | HR 57 | Ht 66.0 in | Wt 251.0 lb

## 2016-01-04 DIAGNOSIS — I10 Essential (primary) hypertension: Secondary | ICD-10-CM

## 2016-01-04 DIAGNOSIS — E7800 Pure hypercholesterolemia, unspecified: Secondary | ICD-10-CM

## 2016-01-04 NOTE — Assessment & Plan Note (Signed)
History of hypertension blood pressure measured at 140/74. He is on Geophysical data processor. Continue current meds at current dosing

## 2016-01-04 NOTE — Assessment & Plan Note (Signed)
History of CAD status post mid AV groove circumflex, total occlusion PCI and drug-eluting stenting with a 3 mm x 30 mm long resolute drug-eluting stent. He did have a 50% mid LAD and proximal PDA stenosis and normal LV function. His Myoview performed postintervention showed normalization of his lateral wall ischemia. In the longer has chest pain but does get mildly dyspneic when walking up stairs. On dual antiplatelet therapy.

## 2016-01-04 NOTE — Assessment & Plan Note (Signed)
History of hyperlipidemia on statin therapy with recent lipid profile performed by his PCP 12/15/15 revealed total cholesterol 142, LDL 81 and HDL of 34.

## 2016-01-04 NOTE — Progress Notes (Signed)
01/04/2016 Bradley Holmes   1960/11/13  [id number]  Primary Physician Tivis Ringer, MD Primary Cardiologist: [name] MD Renae Gloss  HPI:  The patient is a 55 year old mildly overweight single Caucasian male with no children whom I last saw in the office 12/28/14. 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 He was initially referred because of an abnormal EKG with risk factors that included diabetes, hypertension and hyperlipidemia as well as family history. He was getting some chest pain and dyspnea. His EKG showed poor R-wave progression with Q-waves, a left anterior fascicular block. Myoview stress test showed inferolateral scar with mild peri/infarct ischemia, and because of this I performed cardiac catheterization on him 10/24/2010 revealing an occluded circumflex in the mid AV groove, 50% mid LAD and proximal PDA stenosis with normal LV function. I was ultimately able to cross his chronically occluded circumflex and stented him with a resolute 3.0 x 30 drug-eluting stent. Since that time, he had done well. A followup Myoview showed improved perfusion to the lateral wall. Since I saw him a year ago he has done remarkably well. He denies chest pain but does get some dyspnea on exertion. He has gained 9pounds and admits to dietary indiscretion and not exercising.   Current Outpatient Prescriptions  Medication Sig Dispense Refill  . Acetaminophen (TYLENOL PO) Take by mouth as needed.    Marland Kitchen aspirin 325 MG tablet Take 325 mg by mouth daily.    . BD PEN NEEDLE NANO U/F 32G X 4 MM MISC     . BYSTOLIC 10 MG tablet TAKE 1 TABLET EVERY EVENING 30 tablet 2  . clopidogrel (PLAVIX) 75 MG tablet TAKE 1 TABLET BY MOUTH EVERY DAY 90 tablet 3  . Coenzyme Q10 (CO Q-10) 200 MG CAPS Take 1 tablet by mouth daily.    . CRESTOR 5 MG tablet TAKE 1 TABLET BY MOUTH EVERY OTHER DAY 15 tablet 9  .  dapagliflozin propanediol (FARXIGA) 10 MG TABS tablet Take 10 mg by mouth daily.    . Insulin Aspart (NOVOLOG FLEXPEN [place]) Inject 16 Units into the skin 3 (three) times daily with meals. Short term quick acting    . Insulin Glargine (BASAGLAR KWIKPEN) 100 UNIT/ML SOPN Inject 64 Units into the skin every morning.    Marland Kitchen KLOR-CON M20 20 MEQ tablet Take 20 mEq by mouth once.     Marland Kitchen MOVIPREP 100 G SOLR     . nitroGLYCERIN (NITROSTAT) 0.4 MG SL tablet PLACE 1 TABLET (0.4 MG TOTAL) UNDER THE TONGUE EVERY 5 (FIVE) MINUTES AS NEEDED FOR CHEST PAIN. 25 tablet 1  . ONE TOUCH ULTRA TEST test strip     . ONETOUCH DELICA LANCETS 99991111 MISC Apply 1 strip topically 3 (three) times daily.  8  . TRIBENZOR 40-10-25 MG TABS Take 1 tablet by mouth daily.      No current facility-administered medications for this visit.     Allergies  Allergen Reactions  . Norvasc [Amlodipine Besylate] Other (See Comments)    Edema  . Sulfa Antibiotics Hives    Social History   Social History  . Marital status: Single    Spouse name: N/A  . Number of children: N/A  . Years of education: N/A   Occupational History  . Not on file.   Social History Main Topics  . Smoking status: Never Smoker  . Smokeless tobacco: Never Used  .  Alcohol use Yes     Comment: occassionally  . Drug use: No  . Sexual activity: Not on file   Other Topics Concern  . Not on file   Social History Narrative  . No narrative on file     Review of Systems: General: negative for chills, fever, night sweats or weight changes.  Cardiovascular: negative for chest pain, dyspnea on exertion, edema, orthopnea, palpitations, paroxysmal nocturnal dyspnea or shortness of breath Dermatological: negative for rash Respiratory: negative for cough or wheezing Urologic: negative for hematuria Abdominal: negative for nausea, vomiting, diarrhea, bright red blood per rectum, melena, or hematemesis Neurologic: negative for visual changes, syncope, or  dizziness All other systems reviewed and are otherwise negative except as noted above.    Blood pressure 140/74, pulse (!) 57, height 5\' 6"  (1.676 m), weight 251 lb (113.9 kg).  General appearance: alert and no distress Neck: no adenopathy, no carotid bruit, no JVD, supple, symmetrical, trachea midline and thyroid not enlarged, symmetric, no tenderness/mass/nodules Lungs: clear to auscultation bilaterally Heart: regular rate and rhythm, S1, S2 normal, no murmur, click, rub or gallop Extremities: extremities normal, atraumatic, no cyanosis or edema  EKG sinus bradycardia at 57 with left anterior fascicular block and poor R-wave progression. I personally reviewed this EKG  ASSESSMENT AND PLAN:   Coronary artery disease History of CAD status post mid AV groove circumflex, total occlusion PCI and drug-eluting stenting with a 3 mm x 30 mm long resolute drug-eluting stent. He did have a 50% mid LAD and proximal PDA stenosis and normal LV function. His Myoview performed postintervention showed normalization of his lateral wall ischemia. In the longer has chest pain but does get mildly dyspneic when walking up stairs. On dual antiplatelet therapy.  Essential hypertension History of hypertension blood pressure measured at 140/74. He is on Geophysical data processor. Continue current meds at current dosing  Hyperlipidemia History of hyperlipidemia on statin therapy with recent lipid profile performed by his PCP 12/15/15 revealed total cholesterol 142, LDL 81 and HDL of 34.      [name] MD FACP,FACC,FAHA, [facility] 01/04/2016 2:46 PM

## 2016-01-04 NOTE — Patient Instructions (Signed)
Medication Instructions:  Continue current medications.   Follow-Up: Your physician wants you to follow-up in: 12 months with Dr Gwenlyn Found.  You will receive a reminder letter in the mail two months in advance. If you don't receive a letter, please call our office to schedule the follow-up appointment.   If you need a refill on your cardiac medications before your next appointment, please call your pharmacy.

## 2016-02-24 ENCOUNTER — Other Ambulatory Visit: Payer: Self-pay | Admitting: Cardiovascular Disease

## 2016-04-10 ENCOUNTER — Other Ambulatory Visit: Payer: Self-pay

## 2016-04-10 MED ORDER — NITROGLYCERIN 0.4 MG SL SUBL: 0.4000 mg | SUBLINGUAL_TABLET | SUBLINGUAL | 3 refills | Status: DC | PRN

## 2017-02-05 ENCOUNTER — Encounter (INDEPENDENT_AMBULATORY_CARE_PROVIDER_SITE_OTHER): Payer: Self-pay

## 2017-02-05 ENCOUNTER — Encounter: Payer: Self-pay | Admitting: Cardiovascular Disease

## 2017-02-05 ENCOUNTER — Ambulatory Visit: Payer: BC Managed Care – PPO | Admitting: Cardiovascular Disease

## 2017-02-05 DIAGNOSIS — I10 Essential (primary) hypertension: Secondary | ICD-10-CM

## 2017-02-05 DIAGNOSIS — I2510 Atherosclerotic heart disease of native coronary artery without angina pectoris: Secondary | ICD-10-CM | POA: Diagnosis not present

## 2017-02-05 DIAGNOSIS — E7800 Pure hypercholesterolemia, unspecified: Secondary | ICD-10-CM

## 2017-02-05 NOTE — Assessment & Plan Note (Signed)
History of CAD status post catheterization 10/24/10 revealing occluded circumflex in the mid AV groove a 50% mid LAD and proximal PDA stenosis normal LV function. I ultimately was able to cross his circumflex CTO and stented him with a resolute 3 mm x 30 mm long drug-eluting stent. He has done well since. Follow Myoview showed improvement in his lateral wall perfusion. He denies chest pain or shortness of breath.

## 2017-02-05 NOTE — Assessment & Plan Note (Signed)
History of essential hypertension blood pressure measured at 146/84. He is on Set designer . Continue current meds at current dosing.

## 2017-02-05 NOTE — Progress Notes (Signed)
02/05/2017 Bradley Holmes   12-27-1960  [id number]  Primary Physician [name], MD Primary Cardiologist: [name] MD [name], [place]  HPI:  Bradley Holmes is a 55 y.o.  mildly overweight single Caucasian male with no children whom I last saw in the office 01/04/16. 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 He was initially referred because of an abnormal EKG with risk factors that included diabetes, hypertension and hyperlipidemia as well as family history. He was getting some chest pain and dyspnea. His EKG showed poor R-wave progression with Q-waves, a left anterior fascicular block. Myoview stress test showed inferolateral scar with mild peri/infarct ischemia, and because of this I performed cardiac catheterization on him 10/24/2010 revealing an occluded circumflex in the mid AV groove, 50% mid LAD and proximal PDA stenosis with normal LV function. I was ultimately able to cross his chronically occluded circumflex and stented him with a resolute 3.0 x 30 drug-eluting stent. Since that time, he had done well. A followup Myoview showed improved perfusion to the lateral wall. Since I saw him a year ago he has done remarkably well. He denies chest pain but does get some dyspnea on exertion. He does say that his recent LDL was 1:30 on 5 mg of Crestor and his PCP increased to 10 mg although he admits to being statin intolerant. He really does not exercise much.     Current Meds  Medication Sig  . Acetaminophen (TYLENOL PO) Take by mouth as needed.  Marland Kitchen aspirin 325 MG tablet Take 325 mg by mouth daily.  . BD PEN NEEDLE NANO U/F 32G X 4 MM MISC   . BYSTOLIC 10 MG tablet TAKE 1 TABLET EVERY EVENING  . clopidogrel (PLAVIX) 75 MG tablet TAKE 1 TABLET BY MOUTH EVERY DAY  . Coenzyme Q10 (CO Q-10) 200 MG CAPS Take 1 tablet by mouth daily.  . CRESTOR 5 MG tablet TAKE 1 TABLET BY MOUTH EVERY  OTHER DAY  . dapagliflozin propanediol (FARXIGA) 10 MG TABS tablet Take 10 mg by mouth daily.  . Insulin Aspart (NOVOLOG FLEXPEN [place]) Inject 16 Units into the skin 3 (three) times daily with meals. Short term quick acting  . Insulin Glargine (BASAGLAR KWIKPEN) 100 UNIT/ML SOPN Inject 64 Units into the skin every morning.  Marland Kitchen KLOR-CON M20 20 MEQ tablet Take 20 mEq by mouth once.   . nitroGLYCERIN (NITROSTAT) 0.4 MG SL tablet Place 1 tablet (0.4 mg total) under the tongue every 5 (five) minutes as needed for chest pain.  Marland Kitchen Omeprazole (PRILOSEC PO) Take 1 tablet by mouth daily.  . ONE TOUCH ULTRA TEST test strip   . ONETOUCH DELICA LANCETS 42A MISC Apply 1 strip topically 3 (three) times daily.  . Potassium Chloride ER 20 MEQ TBCR Take 1 tablet by mouth 3 (three) times daily.  . TRESIBA FLEXTOUCH 200 UNIT/ML SOPN Inject 70 Units as directed daily.  [name] 40-10-25 MG TABS Take 1 tablet by mouth daily.      Allergies  Allergen Reactions  . Norvasc [Amlodipine Besylate] Other (See Comments)    Edema  . Sulfa Antibiotics Hives    Social History   Socioeconomic History  . Marital status: Single    Spouse name: Not on file  . Number of children: Not on file  . Years of education: Not on file  . Highest education level: Not on file  Social  Needs  . Financial resource strain: Not on file  . Food insecurity - worry: Not on file  . Food insecurity - inability: Not on file  . Transportation needs - medical: Not on file  . Transportation needs - non-medical: Not on file  Occupational History  . Not on file  Tobacco Use  . Smoking status: Never Smoker  . Smokeless tobacco: Never Used  Substance and Sexual Activity  . Alcohol use: Yes    Comment: occassionally  . Drug use: No  . Sexual activity: Not on file  Other Topics Concern  . Not on file  Social History Narrative  . Not on file     Review of Systems: General: negative for chills, fever, night sweats or weight changes.    Cardiovascular: negative for chest pain, dyspnea on exertion, edema, orthopnea, palpitations, paroxysmal nocturnal dyspnea or shortness of breath Dermatological: negative for rash Respiratory: negative for cough or wheezing Urologic: negative for hematuria Abdominal: negative for nausea, vomiting, diarrhea, bright red blood per rectum, melena, or hematemesis Neurologic: negative for visual changes, syncope, or dizziness All other systems reviewed and are otherwise negative except as noted above.    Blood pressure (!) 146/84, pulse (!) 51, height 5\' 6"  (1.676 m), weight 249 lb (112.9 kg).  General appearance: alert and no distress Neck: no adenopathy, no carotid bruit, no JVD, supple, symmetrical, trachea midline and thyroid not enlarged, symmetric, no tenderness/mass/nodules Lungs: clear to auscultation bilaterally Heart: regular rate and rhythm, S1, S2 normal, no murmur, click, rub or gallop Extremities: extremities normal, atraumatic, no cyanosis or edema Pulses: 2+ and symmetric Skin: Skin color, texture, turgor normal. No rashes or lesions Neurologic: Alert and oriented X 3, normal strength and tone. Normal symmetric reflexes. Normal coordination and gait  EKG sinus bradycardia 51 with left anterior fascicular block and poor R-wave progression. In addition, he did have LVH.I  Personally reviewed this EKG.  ASSESSMENT AND PLAN:   Coronary artery disease History of CAD status post catheterization 10/24/10 revealing occluded circumflex in the mid AV groove a 50% mid LAD and proximal PDA stenosis normal LV function. I ultimately was able to cross his circumflex CTO and stented him with a resolute 3 mm x 30 mm long drug-eluting stent. He has done well since. Follow Myoview showed improvement in his lateral wall perfusion. He denies chest pain or shortness of breath.  Essential hypertension History of essential hypertension blood pressure measured at 146/84. He is on Conservation officer, nature . Continue current meds at current dosing.  Hyperlipidemia History of hyperlipidemia on low-dose Crestor but intolerant to statin drugs with LDL in the 1:30 range. He would be a good candidate for a PCSK9  monoclonal      [name] MD [facility], [facility] 02/05/2017 3:44 PM

## 2017-02-05 NOTE — Assessment & Plan Note (Signed)
History of hyperlipidemia on low-dose Crestor but intolerant to statin drugs with LDL in the 1:30 range. He would be a good candidate for a PCSK9  monoclonal

## 2017-02-05 NOTE — Patient Instructions (Signed)

## 2017-02-06 ENCOUNTER — Encounter: Payer: Self-pay | Admitting: Cardiovascular Disease

## 2017-02-08 NOTE — Addendum Note (Signed)
Addended by: Zebedee Iba on: 02/08/2017 04:15 PM   Modules accepted: Orders

## 2017-02-14 ENCOUNTER — Other Ambulatory Visit: Payer: Self-pay | Admitting: Pharmacist Clinician (PhC)/ Clinical Pharmacy Specialist

## 2017-02-14 MED ORDER — EVOLOCUMAB 140 MG/ML ~~LOC~~ SOAJ: 140.0000 mg | SUBCUTANEOUS | 12 refills | Status: DC

## 2017-03-03 ENCOUNTER — Other Ambulatory Visit: Payer: Self-pay | Admitting: Cardiovascular Disease

## 2017-03-08 ENCOUNTER — Telehealth: Payer: Self-pay | Admitting: Pharmacist Clinician (PhC)/ Clinical Pharmacy Specialist

## 2017-03-08 MED ORDER — EVOLOCUMAB 140 MG/ML ~~LOC~~ SOAJ: 140.0000 mg | SUBCUTANEOUS | 12 refills | Status: DC

## 2017-03-08 NOTE — Telephone Encounter (Signed)
repatha approved, rx sent to [facility].  Patient aware rx may end up at [place]

## 2017-04-15 ENCOUNTER — Telehealth: Payer: Self-pay | Admitting: Pharmacist Clinician (PhC)/ Clinical Pharmacy Specialist

## 2017-04-15 ENCOUNTER — Other Ambulatory Visit: Payer: Self-pay | Admitting: Pharmacist Clinician (PhC)/ Clinical Pharmacy Specialist

## 2017-04-15 DIAGNOSIS — E785 Hyperlipidemia, unspecified: Secondary | ICD-10-CM

## 2017-04-15 NOTE — Telephone Encounter (Signed)
Patient has taken 2 doses of Repatha, no concerns.  Will repeat labs after 5th or 6th dose.  Patient aware

## 2017-07-05 ENCOUNTER — Other Ambulatory Visit: Payer: Self-pay | Admitting: Cardiovascular Disease

## 2017-07-08 NOTE — Telephone Encounter (Signed)
Rx sent to pharmacy   

## 2017-10-15 ENCOUNTER — Encounter: Payer: Self-pay | Admitting: Cardiovascular Disease

## 2017-10-15 ENCOUNTER — Ambulatory Visit: Payer: BC Managed Care – PPO | Admitting: Cardiovascular Disease

## 2017-10-15 VITALS — BP 126/82 | HR 71 | Ht 66.0 in | Wt 247.0 lb

## 2017-10-15 DIAGNOSIS — R0600 Dyspnea, unspecified: Secondary | ICD-10-CM | POA: Diagnosis not present

## 2017-10-15 DIAGNOSIS — I4819 Other persistent atrial fibrillation: Secondary | ICD-10-CM | POA: Insufficient documentation

## 2017-10-15 DIAGNOSIS — I519 Heart disease, unspecified: Secondary | ICD-10-CM

## 2017-10-15 MED ORDER — ASPIRIN EC 81 MG PO TBEC: 81.0000 mg | DELAYED_RELEASE_TABLET | Freq: Every day | ORAL | 3 refills | Status: AC

## 2017-10-15 MED ORDER — ASPIRIN 81 MG PO CHEW: 81.0000 mg | CHEWABLE_TABLET | Freq: Once | ORAL | Status: DC

## 2017-10-15 NOTE — Assessment & Plan Note (Signed)
History of hyperlipidemia on Repatha with recent lipid profile performed 05/29/2017 revealing a total cholesterol of 96, LDL 41 and HDL 39.

## 2017-10-15 NOTE — Progress Notes (Signed)
10/15/2017 Bradley Holmes   Oct 17, 1960  [id number]  Primary Physician [name], MD Primary Cardiologist: [name] MD [name], [place]  HPI:  Bradley Holmes is a 57 y.o.  mildly overweight single Caucasian male with no children whom I last saw in the 02/05/2017. 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 He was initially referred because of an abnormal EKG with risk factors that included diabetes, hypertension and hyperlipidemia as well as family history. He was getting some chest pain and dyspnea. His EKG showed poor R-wave progression with Q-waves, a left anterior fascicular block. Myoview stress test showed inferolateral scar with mild peri/infarct ischemia, and because of this I performed cardiac catheterization on him 10/24/2010 revealing an occluded circumflex in the mid AV groove, 50% mid LAD and proximal PDA stenosis with normal LV function. I was ultimately able to cross his chronically occluded circumflex and stented him with a resolute 3.0 x 30 drug-eluting stent. Since that time, he had done well. A followup Myoview showed improved perfusion to the lateral wall.  Because of statin intolerance he is beginning Repatha which resulted in marked improvement in his lipid profile.  Since I saw him in November 2018 he is noticed increasing dyspnea on exertion this past spring.  He saw his PCP recently he noticed that his heart rate was irregular and he was diagnosed with A. fib and begun on Eliquis oral anticoagulation.    Current Meds  Medication Sig  . apixaban (ELIQUIS) 5 MG TABS tablet Take 5 mg by mouth 2 (two) times daily.     Allergies  Allergen Reactions  . Norvasc [Amlodipine Besylate] Other (See Comments)    Edema  . Sulfa Antibiotics Hives    Social History   Socioeconomic History  . Marital status: Single    Spouse name: Not on file  . Number of children: Not on file    . Years of education: Not on file  . Highest education level: Not on file  Occupational History  . Not on file  Social Needs  . Financial resource strain: Not on file  . Food insecurity:    Worry: Not on file    Inability: Not on file  . Transportation needs:    Medical: Not on file    Non-medical: Not on file  Tobacco Use  . Smoking status: Never Smoker  . Smokeless tobacco: Never Used  Substance and Sexual Activity  . Alcohol use: Yes    Comment: occassionally  . Drug use: No  . Sexual activity: Not on file  Lifestyle  . Physical activity:    Days per week: Not on file    Minutes per session: Not on file  . Stress: Not on file  Relationships  . Social connections:    Talks on phone: Not on file    Gets together: Not on file    Attends religious service: Not on file    Active member of club or organization: Not on file    Attends meetings of clubs or organizations: Not on file    Relationship status: Not on file  . Intimate partner violence:    Fear of current or ex partner: Not on file    Emotionally abused: Not on file    Physically abused: Not on file    Forced sexual activity: Not on file  Other Topics Concern  . Not on file  Social History Narrative  .  Not on file     Review of Systems: General: negative for chills, fever, night sweats or weight changes.  Cardiovascular: negative for chest pain, dyspnea on exertion, edema, orthopnea, palpitations, paroxysmal nocturnal dyspnea or shortness of breath Dermatological: negative for rash Respiratory: negative for cough or wheezing Urologic: negative for hematuria Abdominal: negative for nausea, vomiting, diarrhea, bright red blood per rectum, melena, or hematemesis Neurologic: negative for visual changes, syncope, or dizziness All other systems reviewed and are otherwise negative except as noted above.    Blood pressure 126/82, pulse 71, height 5\' 6"  (1.676 m), weight 247 lb (112 kg).  General appearance:  alert and no distress Neck: no adenopathy, no carotid bruit, no JVD, supple, symmetrical, trachea midline and thyroid not enlarged, symmetric, no tenderness/mass/nodules Lungs: clear to auscultation bilaterally Heart: irregularly irregular rhythm Extremities: extremities normal, atraumatic, no cyanosis or edema Pulses: 2+ and symmetric Skin: Skin color, texture, turgor normal. No rashes or lesions Neurologic: Alert and oriented X 3, normal strength and tone. Normal symmetric reflexes. Normal coordination and gait  EKG atrial fibrillation with ventricular response of 71, poor R wave progression with inferior Q waves.  I personally reviewed this EKG.  ASSESSMENT AND PLAN:   Coronary artery disease History of CAD status post circumflex CTO intervention by myself 10/24/2010 using a resolute drug-eluting stent.  He did have 50% mid LAD and proximal PDA stenosis as well that time with normal LV function.  Follow-up Myoview performed after that showed improvement in his lateral wall perfusion.  He has been on dual antiplatelet therapy until recently when he was begun on Eliquis for A. fib and his antiplatelet agents were discontinued.  Essential hypertension History of essential hypertension her blood pressure measured at 126/82.  He is on Geophysical data processor.  Hyperlipidemia History of hyperlipidemia on Repatha with recent lipid profile performed 05/29/2017 revealing a total cholesterol of 96, LDL 41 and HDL 39.  Persistent atrial fibrillation [facility]) Mr. Koenigs was recently found to be in A. fib by his PCP and begun on Eliquis.  By history it sounds like this began sometime this past spring when he noticed increasing dyspnea on exertion.  He was told to stop his antiplatelet drugs at that time.  I am going to get a 2D echocardiogram and a Myoview stress test to assess LV function and rule out an ischemic etiology.  My plan will be to perform outpatient DC cardioversion after 4 weeks of  uninterrupted anti-coagulation.      [name] MD FACP,FACC,FAHA, FSCAI 10/15/2017 1:50 PM

## 2017-10-15 NOTE — Assessment & Plan Note (Signed)
History of CAD status post circumflex CTO intervention by myself 10/24/2010 using a resolute drug-eluting stent.  He did have 50% mid LAD and proximal PDA stenosis as well that time with normal LV function.  Follow-up Myoview performed after that showed improvement in his lateral wall perfusion.  He has been on dual antiplatelet therapy until recently when he was begun on Eliquis for A. fib and his antiplatelet agents were discontinued.

## 2017-10-15 NOTE — Assessment & Plan Note (Signed)
Bradley Holmes was recently found to be in A. fib by his PCP and begun on Eliquis.  By history it sounds like this began sometime this past spring when he noticed increasing dyspnea on exertion.  He was told to stop his antiplatelet drugs at that time.  I am going to get a 2D echocardiogram and a Myoview stress test to assess LV function and rule out an ischemic etiology.  My plan will be to perform outpatient DC cardioversion after 4 weeks of uninterrupted anti-coagulation.

## 2017-10-15 NOTE — Patient Instructions (Signed)
Medication Instructions:  Your physician has recommended you make the following change in your medication:  1) START Asprin 81 mg tablet by mouth ONCE daily   Labwork: none  Testing/Procedures: Your physician has requested that you have an echocardiogram. Echocardiography is a painless test that uses sound waves to create images of your heart. It provides your doctor with information about the size and shape of your heart and how well your heart's chambers and valves are working. This procedure takes approximately one hour. There are no restrictions for this procedure.  Your physician has requested that you have a lexiscan myoview. For further information please visit [email or web address]. Please follow instruction sheet, as given.    Follow-Up: Your physician recommends that you schedule a follow-up appointment in: 1 month with Dr. Gwenlyn Found.   Any Other Special Instructions Will Be Listed Below (If Applicable).     If you need a refill on your cardiac medications before your next appointment, please call your pharmacy.

## 2017-10-15 NOTE — Assessment & Plan Note (Signed)
History of essential hypertension her blood pressure measured at 126/82.  He is on Geophysical data processor.

## 2017-10-17 ENCOUNTER — Telehealth (HOSPITAL_COMMUNITY): Payer: Self-pay | Admitting: *Deleted

## 2017-10-17 NOTE — Telephone Encounter (Signed)
Left message on voicemail per DPR in reference to upcoming appointment scheduled on 10/21/17 with detailed instructions given per Myocardial Perfusion Study Information Sheet for the test. LM to arrive 15 minutes early, and that it is imperative to arrive on time for appointment to keep from having the test rescheduled. If you need to cancel or reschedule your appointment, please call the office within 24 hours of your appointment. Failure to do so may result in a cancellation of your appointment, and a $50 no show fee. Phone number given for call back for any questions. Kirstie Peri, RN

## 2017-10-21 ENCOUNTER — Ambulatory Visit (HOSPITAL_COMMUNITY): Payer: BC Managed Care – PPO | Attending: Cardiology

## 2017-10-21 ENCOUNTER — Ambulatory Visit (HOSPITAL_BASED_OUTPATIENT_CLINIC_OR_DEPARTMENT_OTHER): Payer: BC Managed Care – PPO

## 2017-10-21 ENCOUNTER — Other Ambulatory Visit: Payer: Self-pay

## 2017-10-21 VITALS — Ht 66.0 in | Wt 247.0 lb

## 2017-10-21 DIAGNOSIS — I2510 Atherosclerotic heart disease of native coronary artery without angina pectoris: Secondary | ICD-10-CM | POA: Diagnosis not present

## 2017-10-21 DIAGNOSIS — E785 Hyperlipidemia, unspecified: Secondary | ICD-10-CM | POA: Insufficient documentation

## 2017-10-21 DIAGNOSIS — I4891 Unspecified atrial fibrillation: Secondary | ICD-10-CM | POA: Diagnosis not present

## 2017-10-21 DIAGNOSIS — R0600 Dyspnea, unspecified: Secondary | ICD-10-CM | POA: Diagnosis not present

## 2017-10-21 DIAGNOSIS — I519 Heart disease, unspecified: Secondary | ICD-10-CM

## 2017-10-21 DIAGNOSIS — I10 Essential (primary) hypertension: Secondary | ICD-10-CM | POA: Diagnosis not present

## 2017-10-21 DIAGNOSIS — E119 Type 2 diabetes mellitus without complications: Secondary | ICD-10-CM

## 2017-10-21 DIAGNOSIS — Z794 Long term (current) use of insulin: Secondary | ICD-10-CM

## 2017-10-21 DIAGNOSIS — IMO0001 Reserved for inherently not codable concepts without codable children: Secondary | ICD-10-CM

## 2017-10-21 DIAGNOSIS — I4819 Other persistent atrial fibrillation: Secondary | ICD-10-CM

## 2017-10-21 LAB — ECHOCARDIOGRAM COMPLETE
HEIGHTINCHES: 66 in
WEIGHTICAEL: 3952 [oz_av]

## 2017-10-21 LAB — MYOCARDIAL PERFUSION IMAGING
CHL CUP NUCLEAR SDS: 3
LV dias vol: 131 mL (ref 62–150)
LVSYSVOL: 79 mL
Peak HR: 85 {beats}/min
RATE: 0.36
Rest HR: 69 {beats}/min
SRS: 5
SSS: 8
TID: 1.04

## 2017-10-21 MED ORDER — TECHNETIUM TC 99M TETROFOSMIN IV KIT
32.6000 | PACK | Freq: Once | INTRAVENOUS | Status: AC | PRN
  Administered 2017-10-21: 32.6 via INTRAVENOUS
  Filled 2017-10-21: qty 33

## 2017-10-21 MED ORDER — REGADENOSON 0.4 MG/5ML IV SOLN
0.4000 mg | Freq: Once | INTRAVENOUS | Status: AC
  Administered 2017-10-21: 0.4 mg via INTRAVENOUS

## 2017-10-21 MED ORDER — TECHNETIUM TC 99M TETROFOSMIN IV KIT
10.1000 | PACK | Freq: Once | INTRAVENOUS | Status: AC | PRN
  Administered 2017-10-21: 10.1 via INTRAVENOUS
  Filled 2017-10-21: qty 11

## 2017-11-20 ENCOUNTER — Encounter: Payer: Self-pay | Admitting: Cardiovascular Disease

## 2017-11-20 ENCOUNTER — Ambulatory Visit: Payer: BC Managed Care – PPO | Admitting: Cardiovascular Disease

## 2017-11-20 VITALS — BP 128/86 | HR 77 | Ht 66.0 in | Wt 254.6 lb

## 2017-11-20 DIAGNOSIS — I481 Persistent atrial fibrillation: Secondary | ICD-10-CM | POA: Diagnosis not present

## 2017-11-20 DIAGNOSIS — I4819 Other persistent atrial fibrillation: Secondary | ICD-10-CM

## 2017-11-20 DIAGNOSIS — I2510 Atherosclerotic heart disease of native coronary artery without angina pectoris: Secondary | ICD-10-CM | POA: Diagnosis not present

## 2017-11-20 DIAGNOSIS — I10 Essential (primary) hypertension: Secondary | ICD-10-CM

## 2017-11-20 DIAGNOSIS — G4733 Obstructive sleep apnea (adult) (pediatric): Secondary | ICD-10-CM

## 2017-11-20 DIAGNOSIS — Z01812 Encounter for preprocedural laboratory examination: Secondary | ICD-10-CM | POA: Diagnosis not present

## 2017-11-20 DIAGNOSIS — E7800 Pure hypercholesterolemia, unspecified: Secondary | ICD-10-CM

## 2017-11-20 NOTE — Assessment & Plan Note (Signed)
History of morbid obesity with a BMI of 41.  I am referring him to our care guide for lifestyle management and nutritional recommendations.

## 2017-11-20 NOTE — H&P (View-Only) (Signed)
11/20/2017 Bradley Holmes   1960-11-08  [id number]  Primary Physician [name], MD Primary Cardiologist: [name] MD [name], [place]  HPI:  Bradley Holmes is a 57 y.o.  mildly overweight single Caucasian male with no children whom I last saw in the  10/15/2017. He relocated to [place] to be the [employer] of [employer], previously at [employer] in [place]. He was initially referred because of an abnormal EKG with risk factors that included diabetes, hypertension and hyperlipidemia as well as family history. He was getting some chest pain and dyspnea. His EKG showed poor R-wave progression with Q-waves, a left anterior fascicular block. Myoview stress test showed inferolateral scar with mild peri/infarct ischemia, and because of this I performed cardiac catheterization on him 10/24/2010 revealing an occluded circumflex in the mid AV groove, 50% mid LAD and proximal PDA stenosis with normal LV function. I was ultimately able to cross his chronically occluded circumflex and stented him with a resolute 3.0 x 30 drug-eluting stent. Since that time, he had done well. A followup Myoview showed improved perfusion to the lateral wall.  Because of statin intolerance he is beginning Repatha which resulted in marked improvement in his lipid profile.  Since I saw him in November 2018 he is noticed increasing dyspnea on exertion this past spring.  He saw his PCP recently he noticed that his heart rate was irregular and he was diagnosed with A. fib and begun on Eliquis oral anticoagulation.  He remains in A. fib and oral and coagulation.  A 2D echo was normal with mild left atrial enlargement and a Myoview was low risk.  I am going arrange for him to undergo outpatient cardioversion, refer him to the outpatient A. fib clinic and arrange for him to see a care guide for dietary modification and lifestyle changes.   Current Meds  Medication Sig  . Acetaminophen  (TYLENOL PO) Take by mouth as needed.  Marland Kitchen apixaban (ELIQUIS) 5 MG TABS tablet Take 5 mg by mouth 2 (two) times daily.  Marland Kitchen aspirin EC 81 MG tablet Take 1 tablet (81 mg total) by mouth daily.  . BD PEN NEEDLE NANO U/F 32G X 4 MM MISC   . BYSTOLIC 10 MG tablet TAKE 1 TABLET EVERY EVENING  . Coenzyme Q10 (CO Q-10) 200 MG CAPS Take 1 tablet by mouth daily.  . dapagliflozin propanediol (FARXIGA) 10 MG TABS tablet Take 10 mg by mouth daily.  . Evolocumab (REPATHA SURECLICK) 937 MG/ML SOAJ Inject 140 mg into the skin every 14 (fourteen) days.  . Insulin Aspart (NOVOLOG FLEXPEN [place]) Inject 16 Units into the skin 3 (three) times daily with meals. Short term quick acting  . KLOR-CON M20 20 MEQ tablet Take 20 mEq by mouth once.   . nitroGLYCERIN (NITROSTAT) 0.4 MG SL tablet PLACE 1 TABLET (0.4 MG TOTAL) UNDER THE TONGUE EVERY 5 (FIVE) MINUTES AS NEEDED FOR CHEST PAIN.  Marland Kitchen Omeprazole (PRILOSEC PO) Take 1 tablet by mouth daily.  . ONE TOUCH ULTRA TEST test strip   . ONETOUCH DELICA LANCETS 90W MISC Apply 1 strip topically 3 (three) times daily.  . Potassium Chloride ER 20 MEQ TBCR Take 1 tablet by mouth 3 (three) times daily.  . TRESIBA FLEXTOUCH 200 UNIT/ML SOPN Inject 70 Units as directed daily.  [name] 40-10-25 MG TABS Take 1 tablet by mouth daily.   . [DISCONTINUED] clopidogrel (PLAVIX) 75 MG tablet TAKE 1 TABLET BY MOUTH EVERY DAY  . [DISCONTINUED]  Insulin Glargine (BASAGLAR KWIKPEN) 100 UNIT/ML SOPN Inject 64 Units into the skin every morning.     Allergies  Allergen Reactions  . Norvasc [Amlodipine Besylate] Other (See Comments)    Edema  . Sulfa Antibiotics Hives    Social History   Socioeconomic History  . Marital status: Single    Spouse name: Not on file  . Number of children: Not on file  . Years of education: Not on file  . Highest education level: Not on file  Occupational History  . Not on file  Social Needs  . Financial resource strain: Not on file  . Food insecurity:     Worry: Not on file    Inability: Not on file  . Transportation needs:    Medical: Not on file    Non-medical: Not on file  Tobacco Use  . Smoking status: Never Smoker  . Smokeless tobacco: Never Used  Substance and Sexual Activity  . Alcohol use: Yes    Comment: occassionally  . Drug use: No  . Sexual activity: Not on file  Lifestyle  . Physical activity:    Days per week: Not on file    Minutes per session: Not on file  . Stress: Not on file  Relationships  . Social connections:    Talks on phone: Not on file    Gets together: Not on file    Attends religious service: Not on file    Active member of club or organization: Not on file    Attends meetings of clubs or organizations: Not on file    Relationship status: Not on file  . Intimate partner violence:    Fear of current or ex partner: Not on file    Emotionally abused: Not on file    Physically abused: Not on file    Forced sexual activity: Not on file  Other Topics Concern  . Not on file  Social History Narrative  . Not on file     Review of Systems: General: negative for chills, fever, night sweats or weight changes.  Cardiovascular: negative for chest pain, dyspnea on exertion, edema, orthopnea, palpitations, paroxysmal nocturnal dyspnea or shortness of breath Dermatological: negative for rash Respiratory: negative for cough or wheezing Urologic: negative for hematuria Abdominal: negative for nausea, vomiting, diarrhea, bright red blood per rectum, melena, or hematemesis Neurologic: negative for visual changes, syncope, or dizziness All other systems reviewed and are otherwise negative except as noted above.    Blood pressure 128/86, pulse 77, height 5\' 6"  (1.676 m), weight 254 lb 9.6 oz (115.5 kg).  General appearance: alert and no distress Neck: no adenopathy, no carotid bruit, no JVD, supple, symmetrical, trachea midline and thyroid not enlarged, symmetric, no tenderness/mass/nodules Lungs: clear to  auscultation bilaterally Heart: regular rate and rhythm, S1, S2 normal, no murmur, click, rub or gallop Extremities: extremities normal, atraumatic, no cyanosis or edema Pulses: 2+ and symmetric Skin: Skin color, texture, turgor normal. No rashes or lesions Neurologic: Alert and oriented X 3, normal strength and tone. Normal symmetric reflexes. Normal coordination and gait  EKG not performed today  ASSESSMENT AND PLAN:   Persistent atrial fibrillation (HCC) History of persistent A. fib at least since the spring of this year rate controlled on Eliquis oral anticoagulations.  He does admit to some shortness of breath.  I am going to arrange for him none did undergo outpatient cardioversion in the next 1 to 2 weeks.  2D echo revealed normal LV systolic function with mild  left atrial enlargement.  I am also going to arrange for him to see the A. fib clinic.  Coronary artery disease History of CAD status post cardiac catheterization by myself 10/24/2010 revealing occluded circumflex in the AV groove which I was able to recanalize.  Normal LV function.  I stented him with a 3 mm x 30-minute long drug-eluting stent.  Follow-up Myoview showed improved lateral wall perfusion as most recent Myoview performed 10/21/2017 was normal as well as was a 2D echo.  He was complaining of some shortness of breath.  Essential hypertension History of essential hypertension her blood pressure measured today 120/86.  He is on Geophysical data processor.  Continue current meds at current dosing.  Hyperlipidemia She of hyperlipidemia on Repatha with lipid profile performed 05/29/2017 revealing a total cholesterol of 96, LDL 41 and HDL of 39.  Obstructive sleep apnea Symptoms of obstructive sleep apnea scheduled for outpatient sleep study  Morbid obesity ([place]) History of morbid obesity with a BMI of 41.  I am referring him to our care guide for lifestyle management and nutritional recommendations.      [name] MD FACP,FACC,FAHA, [facility] 11/20/2017 10:18 AM

## 2017-11-20 NOTE — Assessment & Plan Note (Signed)
Symptoms of obstructive sleep apnea scheduled for outpatient sleep study

## 2017-11-20 NOTE — Assessment & Plan Note (Signed)
History of essential hypertension her blood pressure measured today 120/86.  He is on Geophysical data processor.  Continue current meds at current dosing.

## 2017-11-20 NOTE — Assessment & Plan Note (Signed)
She of hyperlipidemia on Repatha with lipid profile performed 05/29/2017 revealing a total cholesterol of 96, LDL 41 and HDL of 39.

## 2017-11-20 NOTE — Progress Notes (Signed)
11/20/2017 Bradley Holmes   1960-05-14  [id number]  Primary Physician Bradley Solian, MD Primary Cardiologist: [name] MD Bradley Holmes, [place]  HPI:  Bradley Holmes is a 57 y.o.  mildly overweight single Caucasian male with no children whom I last saw in the  10/15/2017. He relocated to [place] to be the [employer] of [employer], previously at [employer] in [place]. He was initially referred because of an abnormal EKG with risk factors that included diabetes, hypertension and hyperlipidemia as well as family history. He was getting some chest pain and dyspnea. His EKG showed poor R-wave progression with Q-waves, a left anterior fascicular block. Myoview stress test showed inferolateral scar with mild peri/infarct ischemia, and because of this I performed cardiac catheterization on him 10/24/2010 revealing an occluded circumflex in the mid AV groove, 50% mid LAD and proximal PDA stenosis with normal LV function. I was ultimately able to cross his chronically occluded circumflex and stented him with a resolute 3.0 x 30 drug-eluting stent. Since that time, he had done well. A followup Myoview showed improved perfusion to the lateral wall.  Because of statin intolerance he is beginning Repatha which resulted in marked improvement in his lipid profile.  Since I saw him in November 2018 he is noticed increasing dyspnea on exertion this past spring.  He saw his PCP recently he noticed that his heart rate was irregular and he was diagnosed with A. fib and begun on Eliquis oral anticoagulation.  He remains in A. fib and oral and coagulation.  A 2D echo was normal with mild left atrial enlargement and a Myoview was low risk.  I am going arrange for him to undergo outpatient cardioversion, refer him to the outpatient A. fib clinic and arrange for him to see a care guide for dietary modification and lifestyle changes.   Current Meds  Medication Sig  . Acetaminophen  (TYLENOL PO) Take by mouth as needed.  Marland Kitchen apixaban (ELIQUIS) 5 MG TABS tablet Take 5 mg by mouth 2 (two) times daily.  Marland Kitchen aspirin EC 81 MG tablet Take 1 tablet (81 mg total) by mouth daily.  . BD PEN NEEDLE NANO U/F 32G X 4 MM MISC   . BYSTOLIC 10 MG tablet TAKE 1 TABLET EVERY EVENING  . Coenzyme Q10 (CO Q-10) 200 MG CAPS Take 1 tablet by mouth daily.  . dapagliflozin propanediol (FARXIGA) 10 MG TABS tablet Take 10 mg by mouth daily.  . Evolocumab (REPATHA SURECLICK) 761 MG/ML SOAJ Inject 140 mg into the skin every 14 (fourteen) days.  . Insulin Aspart (NOVOLOG FLEXPEN [place]) Inject 16 Units into the skin 3 (three) times daily with meals. Short term quick acting  . KLOR-CON M20 20 MEQ tablet Take 20 mEq by mouth once.   . nitroGLYCERIN (NITROSTAT) 0.4 MG SL tablet PLACE 1 TABLET (0.4 MG TOTAL) UNDER THE TONGUE EVERY 5 (FIVE) MINUTES AS NEEDED FOR CHEST PAIN.  Marland Kitchen Omeprazole (PRILOSEC PO) Take 1 tablet by mouth daily.  . ONE TOUCH ULTRA TEST test strip   . ONETOUCH DELICA LANCETS 60V MISC Apply 1 strip topically 3 (three) times daily.  . Potassium Chloride ER 20 MEQ TBCR Take 1 tablet by mouth 3 (three) times daily.  . TRESIBA FLEXTOUCH 200 UNIT/ML SOPN Inject 70 Units as directed daily.  [name] 40-10-25 MG TABS Take 1 tablet by mouth daily.   . [DISCONTINUED] clopidogrel (PLAVIX) 75 MG tablet TAKE 1 TABLET BY MOUTH EVERY DAY  . [DISCONTINUED]  Insulin Glargine (BASAGLAR KWIKPEN) 100 UNIT/ML SOPN Inject 64 Units into the skin every morning.     Allergies  Allergen Reactions  . Norvasc [Amlodipine Besylate] Other (See Comments)    Edema  . Sulfa Antibiotics Hives    Social History   Socioeconomic History  . Marital status: Single    Spouse name: Not on file  . Number of children: Not on file  . Years of education: Not on file  . Highest education level: Not on file  Occupational History  . Not on file  Social Needs  . Financial resource strain: Not on file  . Food insecurity:     Worry: Not on file    Inability: Not on file  . Transportation needs:    Medical: Not on file    Non-medical: Not on file  Tobacco Use  . Smoking status: Never Smoker  . Smokeless tobacco: Never Used  Substance and Sexual Activity  . Alcohol use: Yes    Comment: occassionally  . Drug use: No  . Sexual activity: Not on file  Lifestyle  . Physical activity:    Days per week: Not on file    Minutes per session: Not on file  . Stress: Not on file  Relationships  . Social connections:    Talks on phone: Not on file    Gets together: Not on file    Attends religious service: Not on file    Active member of club or organization: Not on file    Attends meetings of clubs or organizations: Not on file    Relationship status: Not on file  . Intimate partner violence:    Fear of current or ex partner: Not on file    Emotionally abused: Not on file    Physically abused: Not on file    Forced sexual activity: Not on file  Other Topics Concern  . Not on file  Social History Narrative  . Not on file     Review of Systems: General: negative for chills, fever, night sweats or weight changes.  Cardiovascular: negative for chest pain, dyspnea on exertion, edema, orthopnea, palpitations, paroxysmal nocturnal dyspnea or shortness of breath Dermatological: negative for rash Respiratory: negative for cough or wheezing Urologic: negative for hematuria Abdominal: negative for nausea, vomiting, diarrhea, bright red blood per rectum, melena, or hematemesis Neurologic: negative for visual changes, syncope, or dizziness All other systems reviewed and are otherwise negative except as noted above.    Blood pressure 128/86, pulse 77, height 5\' 6"  (1.676 m), weight 254 lb 9.6 oz (115.5 kg).  General appearance: alert and no distress Neck: no adenopathy, no carotid bruit, no JVD, supple, symmetrical, trachea midline and thyroid not enlarged, symmetric, no tenderness/mass/nodules Lungs: clear to  auscultation bilaterally Heart: regular rate and rhythm, S1, S2 normal, no murmur, click, rub or gallop Extremities: extremities normal, atraumatic, no cyanosis or edema Pulses: 2+ and symmetric Skin: Skin color, texture, turgor normal. No rashes or lesions Neurologic: Alert and oriented X 3, normal strength and tone. Normal symmetric reflexes. Normal coordination and gait  EKG not performed today  ASSESSMENT AND PLAN:   Persistent atrial fibrillation (HCC) History of persistent A. fib at least since the spring of this year rate controlled on Eliquis oral anticoagulations.  He does admit to some shortness of breath.  I am going to arrange for him none did undergo outpatient cardioversion in the next 1 to 2 weeks.  2D echo revealed normal LV systolic function with mild  left atrial enlargement.  I am also going to arrange for him to see the A. fib clinic.  Coronary artery disease History of CAD status post cardiac catheterization by myself 10/24/2010 revealing occluded circumflex in the AV groove which I was able to recanalize.  Normal LV function.  I stented him with a 3 mm x 30-minute long drug-eluting stent.  Follow-up Myoview showed improved lateral wall perfusion as most recent Myoview performed 10/21/2017 was normal as well as was a 2D echo.  He was complaining of some shortness of breath.  Essential hypertension History of essential hypertension her blood pressure measured today 120/86.  He is on Geophysical data processor.  Continue current meds at current dosing.  Hyperlipidemia She of hyperlipidemia on Repatha with lipid profile performed 05/29/2017 revealing a total cholesterol of 96, LDL 41 and HDL of 39.  Obstructive sleep apnea Symptoms of obstructive sleep apnea scheduled for outpatient sleep study  Morbid obesity ([place]) History of morbid obesity with a BMI of 41.  I am referring him to our care guide for lifestyle management and nutritional recommendations.      [name] MD FACP,FACC,FAHA, [facility] 11/20/2017 10:18 AM

## 2017-11-20 NOTE — Addendum Note (Signed)
Addended by: Newt Minion on: 11/20/2017 03:04 PM   Modules accepted: Orders

## 2017-11-20 NOTE — Assessment & Plan Note (Signed)
History of persistent A. fib at least since the spring of this year rate controlled on Eliquis oral anticoagulations.  He does admit to some shortness of breath.  I am going to arrange for him none did undergo outpatient cardioversion in the next 1 to 2 weeks.  2D echo revealed normal LV systolic function with mild left atrial enlargement.  I am also going to arrange for him to see the A. fib clinic.

## 2017-11-20 NOTE — Assessment & Plan Note (Signed)
History of CAD status post cardiac catheterization by myself 10/24/2010 revealing occluded circumflex in the AV groove which I was able to recanalize.  Normal LV function.  I stented him with a 3 mm x 30-minute long drug-eluting stent.  Follow-up Myoview showed improved lateral wall perfusion as most recent Myoview performed 10/21/2017 was normal as well as was a 2D echo.  He was complaining of some shortness of breath.

## 2017-11-20 NOTE — Patient Instructions (Signed)
Medication Instructions:  Your physician recommends that you continue on your current medications as directed. Please refer to the Current Medication list given to you today.   Labwork: Your physician recommends that you return for lab work in: Thursday 9/19   Testing/Procedures:  You are scheduled for a TEE/Cardioversion/TEE Cardioversion on Monday, September 23rd with Dr. Aundra Dubin.  Please arrive at the [facility] (Main Entrance A) at [facility]: [address] [place], [place] [address] at 7:45 am. (1 hour prior to procedure unless lab work is needed; if lab work is needed arrive 1.5 hours ahead)  DIET: Nothing to eat or drink after midnight except a sip of water with medications (see medication instructions below)  Medication Instructions: Hold Tresiba and Novalog morning of procedure.  Continue your anticoagulant: ELIQUIS You will need to continue your anticoagulant after your procedure until you  are told by your  Provider that it is safe to stop   Labs: Wednesday 9/18 CBC and BMET. You do not need to be fasting. Come to: [facility]: [place], [address]. You do not need an appointment  You must have a responsible person to drive you home and stay in the waiting area during your procedure. Failure to do so could result in cancellation.  Bring your insurance cards.  *Special Note: Every effort is made to have your procedure done on time. Occasionally there are emergencies that occur at the hospital that may cause delays. Please be patient if a delay does occur.    Follow-Up: Your physician recommends that you schedule a follow-up appointment in: 1-2 weeks after 9/23 Cardioversion   Any Other Special Instructions Will Be Listed Below (If Applicable).     If you need a refill on your cardiac medications before your next appointment, please call your pharmacy.

## 2017-11-27 LAB — CBC WITH DIFFERENTIAL/PLATELET
Basophils Absolute: 0 10*3/uL (ref 0.0–0.2)
Basos: 0 %
EOS (ABSOLUTE): 0.2 10*3/uL (ref 0.0–0.4)
Eos: 3 %
HEMOGLOBIN: 15.4 g/dL (ref 13.0–17.7)
Hematocrit: 45.1 % (ref 37.5–51.0)
Immature Grans (Abs): 0 10*3/uL (ref 0.0–0.1)
Immature Granulocytes: 0 %
LYMPHS ABS: 1.5 10*3/uL (ref 0.7–3.1)
LYMPHS: 21 %
MCH: 30.3 pg (ref 26.6–33.0)
MCHC: 34.1 g/dL (ref 31.5–35.7)
MCV: 89 fL (ref 79–97)
MONOCYTES: 9 %
Monocytes Absolute: 0.6 10*3/uL (ref 0.1–0.9)
Neutrophils Absolute: 4.8 10*3/uL (ref 1.4–7.0)
Neutrophils: 67 %
Platelets: 181 10*3/uL (ref 150–450)
RBC: 5.09 x10E6/uL (ref 4.14–5.80)
RDW: 13.1 % (ref 12.3–15.4)
WBC: 7.1 10*3/uL (ref 3.4–10.8)

## 2017-11-27 LAB — BASIC METABOLIC PANEL
BUN / CREAT RATIO: 19 (ref 9–20)
BUN: 25 mg/dL — ABNORMAL HIGH (ref 6–24)
CO2: 20 mmol/L (ref 20–29)
Calcium: 9.2 mg/dL (ref 8.7–10.2)
Chloride: 102 mmol/L (ref 96–106)
Creatinine, Ser: 1.29 mg/dL — ABNORMAL HIGH (ref 0.76–1.27)
GFR calc non Af Amer: 62 mL/min/{1.73_m2} (ref >59)
GFR, EST AFRICAN AMERICAN: 71 mL/min/{1.73_m2} (ref >59)
GLUCOSE: 238 mg/dL — AB (ref 65–99)
Potassium: 3.8 mmol/L (ref 3.5–5.2)
SODIUM: 141 mmol/L (ref 134–144)

## 2017-12-02 ENCOUNTER — Encounter (HOSPITAL_COMMUNITY)
Admission: RE | Disposition: A | Discharge status: Home / Self Care | Payer: Self-pay | Source: Ambulatory Visit | Attending: Cardiology

## 2017-12-02 ENCOUNTER — Other Ambulatory Visit: Payer: Self-pay

## 2017-12-02 ENCOUNTER — Encounter (HOSPITAL_COMMUNITY): Payer: Self-pay | Admitting: Certified Registered Nurse Anesthetist

## 2017-12-02 ENCOUNTER — Ambulatory Visit (HOSPITAL_COMMUNITY): Payer: BC Managed Care – PPO | Admitting: Certified Registered Nurse Anesthetist

## 2017-12-02 ENCOUNTER — Ambulatory Visit (HOSPITAL_COMMUNITY)
Admission: RE | Admit: 2017-12-02 | Discharge: 2017-12-02 | Disposition: A | Discharge status: Home / Self Care | Payer: BC Managed Care – PPO | Source: Ambulatory Visit | Attending: Cardiology | Admitting: Cardiology

## 2017-12-02 DIAGNOSIS — Z794 Long term (current) use of insulin: Secondary | ICD-10-CM | POA: Diagnosis not present

## 2017-12-02 DIAGNOSIS — Z7982 Long term (current) use of aspirin: Secondary | ICD-10-CM | POA: Diagnosis not present

## 2017-12-02 DIAGNOSIS — E119 Type 2 diabetes mellitus without complications: Secondary | ICD-10-CM | POA: Diagnosis not present

## 2017-12-02 DIAGNOSIS — Z888 Allergy status to other drugs, medicaments and biological substances status: Secondary | ICD-10-CM | POA: Insufficient documentation

## 2017-12-02 DIAGNOSIS — Z7901 Long term (current) use of anticoagulants: Secondary | ICD-10-CM | POA: Insufficient documentation

## 2017-12-02 DIAGNOSIS — G4733 Obstructive sleep apnea (adult) (pediatric): Secondary | ICD-10-CM | POA: Insufficient documentation

## 2017-12-02 DIAGNOSIS — E785 Hyperlipidemia, unspecified: Secondary | ICD-10-CM | POA: Diagnosis not present

## 2017-12-02 DIAGNOSIS — Z882 Allergy status to sulfonamides status: Secondary | ICD-10-CM | POA: Insufficient documentation

## 2017-12-02 DIAGNOSIS — I481 Persistent atrial fibrillation: Secondary | ICD-10-CM | POA: Diagnosis present

## 2017-12-02 DIAGNOSIS — Z6841 Body Mass Index (BMI) 40.0 and over, adult: Secondary | ICD-10-CM | POA: Diagnosis not present

## 2017-12-02 DIAGNOSIS — Z79899 Other long term (current) drug therapy: Secondary | ICD-10-CM | POA: Insufficient documentation

## 2017-12-02 DIAGNOSIS — I4891 Unspecified atrial fibrillation: Secondary | ICD-10-CM

## 2017-12-02 DIAGNOSIS — I2510 Atherosclerotic heart disease of native coronary artery without angina pectoris: Secondary | ICD-10-CM | POA: Insufficient documentation

## 2017-12-02 DIAGNOSIS — I10 Essential (primary) hypertension: Secondary | ICD-10-CM | POA: Diagnosis not present

## 2017-12-02 HISTORY — PX: CARDIOVERSION: SHX1299

## 2017-12-02 LAB — GLUCOSE, CAPILLARY: Glucose-Capillary: 167 mg/dL — ABNORMAL HIGH (ref 70–99)

## 2017-12-02 SURGERY — CARDIOVERSION
Anesthesia: General

## 2017-12-02 MED ORDER — PROPOFOL 10 MG/ML IV BOLUS
INTRAVENOUS | Status: DC | PRN
  Administered 2017-12-02: 60 mg via INTRAVENOUS
  Administered 2017-12-02: 20 mg via INTRAVENOUS

## 2017-12-02 MED ORDER — LIDOCAINE 2% (20 MG/ML) 5 ML SYRINGE
INTRAMUSCULAR | Status: DC | PRN
  Administered 2017-12-02: 60 mg via INTRAVENOUS

## 2017-12-02 MED ORDER — SODIUM CHLORIDE 0.9 % IV SOLN
INTRAVENOUS | Status: DC | PRN
  Administered 2017-12-02: 09:00:00 via INTRAVENOUS

## 2017-12-02 NOTE — Transfer of Care (Signed)
Immediate Anesthesia Transfer of Care Note  Patient: YOUSAF SAINATO  Procedure(s) Performed: CARDIOVERSION (N/A )  Patient Location: Endoscopy Unit  Anesthesia Type:General  Level of Consciousness: drowsy, patient cooperative and responds to stimulation  Airway & Oxygen Therapy: Patient Spontanous Breathing  Post-op Assessment: Report given to RN and Post -op Vital signs reviewed and stable  Post vital signs: Reviewed and stable  Last Vitals:  Vitals Value Taken Time  BP 172/87 12/02/2017  8:53 AM  Temp    Pulse 55 12/02/2017  8:54 AM  Resp 18 12/02/2017  8:54 AM  SpO2 93 % 12/02/2017  8:54 AM    Last Pain:  Vitals:   12/02/17 0751  TempSrc: Oral  PainSc: 0-No pain         Complications: No apparent anesthesia complications

## 2017-12-02 NOTE — Interval H&P Note (Signed)
History and Physical Interval Note:  12/02/2017 8:48 AM  Bradley Holmes  has presented today for surgery, with the diagnosis of AFIB  The various methods of treatment have been discussed with the patient and family. After consideration of risks, benefits and other options for treatment, the patient has consented to  Procedure(s): CARDIOVERSION (N/A) as a surgical intervention .  The patient's history has been reviewed, patient examined, no change in status, stable for surgery.  I have reviewed the patient's chart and labs.  Questions were answered to the patient's satisfaction.     Amerah Puleo [employer]

## 2017-12-02 NOTE — Procedures (Signed)
Electrical Cardioversion Procedure Note Bradley Holmes [id number] [birth year]  Procedure: Electrical Cardioversion Indications:  Atrial Fibrillation  Procedure Details Consent: Risks of procedure as well as the alternatives and risks of each were explained to the (patient/caregiver).  Consent for procedure obtained. Time Out: Verified patient identification, verified procedure, site/side was marked, verified correct patient position, special equipment/implants available, medications/allergies/relevent history reviewed, required imaging and test results available.  Performed  Patient placed on cardiac monitor, pulse oximetry, supplemental oxygen as necessary.  Sedation given: Propofol Pacer pads placed anterior and posterior chest.  Cardioverted 1 time(s).  Cardioverted at [place].  Evaluation Findings: Post procedure EKG shows: NSR Complications: None Patient did tolerate procedure well.   [name] 12/02/2017, 8:53 AM

## 2017-12-02 NOTE — Discharge Instructions (Signed)
Electrical Cardioversion, Care After °This sheet gives you information about how to care for yourself after your procedure. Your health care provider may also give you more specific instructions. If you have problems or questions, contact your health care provider. °What can I expect after the procedure? °After the procedure, it is common to have: °· Some redness on the skin where the shocks were given. ° °Follow these instructions at home: °· Do not drive for 24 hours if you were given a medicine to help you relax (sedative). °· Take over-the-counter and prescription medicines only as told by your health care provider. °· Ask your health care provider how to check your pulse. Check it often. °· Rest for 48 hours after the procedure or as told by your health care provider. °· Avoid or limit your caffeine use as told by your health care provider. °Contact a health care provider if: °· You feel like your heart is beating too quickly or your pulse is not regular. °· You have a serious muscle cramp that does not go away. °Get help right away if: °· You have discomfort in your chest. °· You are dizzy or you feel faint. °· You have trouble breathing or you are short of breath. °· Your speech is slurred. °· You have trouble moving an arm or leg on one side of your body. °· Your fingers or toes turn cold or blue. °This information is not intended to replace advice given to you by your health care provider. Make sure you discuss any questions you have with your health care provider. °Document Released: 12/17/2012 Document Revised: 09/30/2015 Document Reviewed: 09/02/2015 °Elsevier Interactive Patient Education © 2018 [employer] ° °

## 2017-12-02 NOTE — Anesthesia Preprocedure Evaluation (Addendum)
Anesthesia Evaluation  Patient identified by MRN, date of birth, ID band Patient awake    Reviewed: Allergy & Precautions, H&P , NPO status , Patient's Chart, lab work & pertinent test results, reviewed documented beta blocker date and time   Airway Mallampati: III  TM Distance: >3 FB Neck ROM: Full    Dental no notable dental hx. (+) Teeth Intact, Dental Advisory Given   Pulmonary neg pulmonary ROS,    Pulmonary exam normal breath sounds clear to auscultation       Cardiovascular hypertension, Pt. on medications and Pt. on home beta blockers + CAD   Rhythm:Irregular Rate:Normal     Neuro/Psych negative neurological ROS  negative psych ROS   GI/Hepatic Neg liver ROS, GERD  Medicated and Controlled,  Endo/Other  diabetes, Type 1, Insulin Dependent  Renal/GU negative Renal ROS  negative genitourinary   Musculoskeletal   Abdominal   Peds  Hematology negative hematology ROS (+)   Anesthesia Other Findings   Reproductive/Obstetrics negative OB ROS                            Anesthesia Physical Anesthesia Plan  ASA: III  Anesthesia Plan: General   Post-op Pain Management:    Induction: Intravenous  PONV Risk Score and Plan: 2 and Treatment may vary due to age or medical condition  Airway Management Planned: Mask  Additional Equipment:   Intra-op Plan:   Post-operative Plan:   Informed Consent: I have reviewed the patients History and Physical, chart, labs and discussed the procedure including the risks, benefits and alternatives for the proposed anesthesia with the patient or authorized representative who has indicated his/her understanding and acceptance.   Dental advisory given  Plan Discussed with: CRNA  Anesthesia Plan Comments:         Anesthesia Quick Evaluation

## 2017-12-02 NOTE — Anesthesia Procedure Notes (Signed)
Procedure Name: General with mask airway Date/Time: 12/02/2017 8:46 AM Performed by: White, Amedeo Plenty, CRNA Pre-anesthesia Checklist: Patient identified, Emergency Drugs available, Suction available and Patient being monitored Patient Re-evaluated:Patient Re-evaluated prior to induction Oxygen Delivery Method: Ambu bag Preoxygenation: Pre-oxygenation with 100% oxygen

## 2017-12-02 NOTE — Anesthesia Postprocedure Evaluation (Signed)
Anesthesia Post Note  Patient: Bradley Holmes  Procedure(s) Performed: CARDIOVERSION (N/A )     Patient location during evaluation: PACU Anesthesia Type: General Level of consciousness: awake and alert Pain management: pain level controlled Vital Signs Assessment: post-procedure vital signs reviewed and stable Respiratory status: spontaneous breathing, nonlabored ventilation and respiratory function stable Cardiovascular status: blood pressure returned to baseline and stable Postop Assessment: no apparent nausea or vomiting Anesthetic complications: no    Last Vitals:  Vitals:   12/02/17 0900 12/02/17 0915  BP: (!) 163/83 (!) 162/81  Pulse:    Resp: 15 13  Temp:    SpO2: 97% 94%    Last Pain:  Vitals:   12/02/17 0915  TempSrc:   PainSc: 0-No pain                 Amariana Mirando,W. EDMOND

## 2017-12-04 ENCOUNTER — Encounter: Payer: Self-pay | Admitting: Neurology

## 2017-12-04 ENCOUNTER — Ambulatory Visit: Payer: BC Managed Care – PPO | Admitting: Neurology

## 2017-12-04 VITALS — BP 188/87 | HR 56 | Ht 67.0 in | Wt 254.0 lb

## 2017-12-04 DIAGNOSIS — Z955 Presence of coronary angioplasty implant and graft: Secondary | ICD-10-CM

## 2017-12-04 DIAGNOSIS — I4891 Unspecified atrial fibrillation: Secondary | ICD-10-CM

## 2017-12-04 DIAGNOSIS — G4719 Other hypersomnia: Secondary | ICD-10-CM

## 2017-12-04 DIAGNOSIS — R351 Nocturia: Secondary | ICD-10-CM | POA: Diagnosis not present

## 2017-12-04 DIAGNOSIS — E669 Obesity, unspecified: Secondary | ICD-10-CM

## 2017-12-04 DIAGNOSIS — M25472 Effusion, left ankle: Secondary | ICD-10-CM

## 2017-12-04 DIAGNOSIS — M25471 Effusion, right ankle: Secondary | ICD-10-CM

## 2017-12-04 DIAGNOSIS — R0683 Snoring: Secondary | ICD-10-CM

## 2017-12-04 NOTE — Progress Notes (Signed)
Subjective:    Patient ID: Bradley Holmes is a 57 y.o. male.  HPI     [name], MD, PhD [facility] [address], [address] [place], [place] [address]  Dear Dr. Dagmar Hait,   I saw your patient, Bradley Holmes, upon your kind request in my sleep clinic today for initial consultation of his sleep disorder, in particular, concern for underlying obstructive sleep apnea in the context of morbid obesity and recent diagnosis of A. fib. The patient is unaccompanied today. 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 fib with recent status post cardioversion on 12/02/17, who reports snoring and excessive daytime somnolence. I reviewed your office note from 10/11/2017, which you kindly included.  He is single, has no biological children, is an Forensic scientist host regularly. Works at [employer], [employer] of [employer]. He is a nonsmoker and drinks alcohol rarely, caffeine regularly in the form of diet soda. 2-4 bottles per day on average. He does not typically drink coffee. He is aware that he was advised to reduce his caffeine intake upon discharge from his cardioversion appointment. He is not aware of any family history of OSA, denies morning headaches or telltale restless leg symptoms. Bedtime is around 11 or midnight, rise time at 6:30. He had a tonsillectomy as a child. He reports nocturia about 2-3 times per average night. He has woken up with a sense of gasping for air. He typically sleeps on his sides. He has had nighttime reflux symptoms and takes Prilosec daily. His weight has been fairly stable. His Epworth sleepiness score is 12 out of 24 today, fatigue score is 38 out of 63. He had lost weight in  the past with [employer]ystem but did not keep his diet going.  His Past Medical History Is Significant For: Past Medical History:  Diagnosis Date  . Coronary artery disease    LEXISCAN, 01/05/2011 - defect seen in Basal Inferior, Mid Inferior, and Apical Lateral region(s)-consistent with infarct/scar, global LV systolic function mildly reduced, abnormal study  . GERD (gastroesophageal reflux disease)   . Hyperlipidemia   . Hypertension    2D ECHO, 10/10/2010 - EF >55%, normal  . Insulin dependent diabetes mellitus ([place])   . Pseudoaneurysm ([place])    LEA DOPPLER, 11/01/2010 - right groin hematoma measuring 2.47x0.62x1.61 centimeters in greatest diameter    His Past Surgical History Is Significant For: Past Surgical History:  Procedure Laterality Date  . CARDIAC CATHETERIZATION  10/24/2010   CTI - 3030 Resolute drug-eluting stent, 3.53mm resulting reduction og a long CT to 0% residual with excellent flow.  Marland Kitchen CARDIOVERSION N/A 12/02/2017   Procedure: CARDIOVERSION;  Surgeon: Larey Dresser, MD;  [facility] ENDOSCOPY;  Service: Cardiovascular;  Laterality: N/A;    His Family History Is Significant For: Family History  Problem Relation Age of Onset  . Heart disease Mother 83       Stent placement  . Diabetes Mother   . Diabetes Sister   . Thyroid cancer Brother        2010  . Prostate cancer Maternal Grandfather   . Emphysema Paternal Grandfather     His Social History Is Significant For: Social History   Socioeconomic History  . Marital status: Single    Spouse name:  Not on file  . Number of children: Not on file  . Years of education: Not on file  . Highest education level: Not on file  Occupational History  . Not on file  Social Needs  . Financial resource strain: Not on file  . Food insecurity:    Worry: Not on file    Inability: Not on file  . Transportation needs:    Medical: Not on file    Non-medical: Not on file  Tobacco Use  . Smoking status: Never Smoker  .  Smokeless tobacco: Never Used  Substance and Sexual Activity  . Alcohol use: Yes    Comment: occassionally  . Drug use: No  . Sexual activity: Not on file  Lifestyle  . Physical activity:    Days per week: Not on file    Minutes per session: Not on file  . Stress: Not on file  Relationships  . Social connections:    Talks on phone: Not on file    Gets together: Not on file    Attends religious service: Not on file    Active member of club or organization: Not on file    Attends meetings of clubs or organizations: Not on file    Relationship status: Not on file  Other Topics Concern  . Not on file  Social History Narrative  . Not on file    His Allergies Are:  Allergies  Allergen Reactions  . Norvasc [Amlodipine Besylate] Other (See Comments)    Edema  . Sulfa Antibiotics Hives  :   His Current Medications Are:  Outpatient Encounter Medications as of 12/04/2017  Medication Sig  . apixaban (ELIQUIS) 5 MG TABS tablet Take 5 mg by mouth 2 (two) times daily.  Marland Kitchen aspirin EC 81 MG tablet Take 1 tablet (81 mg total) by mouth daily. (Patient taking differently: Take 81 mg by mouth every evening. )  . BD PEN NEEDLE NANO U/F 32G X 4 MM MISC   . BYSTOLIC 10 MG tablet TAKE 1 TABLET EVERY EVENING (Patient taking differently: Take 10 mg by mouth every evening. )  . Coenzyme Q10 (CO Q-10) 200 MG CAPS Take 200 mg by mouth daily.   . dapagliflozin propanediol (FARXIGA) 10 MG TABS tablet Take 10 mg by mouth daily.  . Evolocumab (REPATHA SURECLICK) 564 MG/ML SOAJ Inject 140 mg into the skin every 14 (fourteen) days.  . Insulin Aspart (NOVOLOG FLEXPEN [place]) Inject 20 Units into the skin 3 (three) times daily with meals.   . mupirocin ointment (BACTROBAN) 2 % Place 1 application into the nose 2 (two) times daily.  . nitroGLYCERIN (NITROSTAT) 0.4 MG SL tablet PLACE 1 TABLET (0.4 MG TOTAL) UNDER THE TONGUE EVERY 5 (FIVE) MINUTES AS NEEDED FOR CHEST PAIN.  Marland Kitchen omeprazole (PRILOSEC) 20 MG capsule Take  20 mg by mouth daily.  . ONE TOUCH ULTRA TEST test strip   . ONETOUCH DELICA LANCETS 33I MISC Apply 1 strip topically 3 (three) times daily.  . Potassium Chloride ER 20 MEQ TBCR Take 20 mEq by mouth 3 (three) times daily.   . TRESIBA FLEXTOUCH 200 UNIT/ML SOPN Inject 80 Units as directed daily.   Marland Kitchen triamcinolone cream (KENALOG) 0.1 % Apply 1 application topically 2 (two) times daily.  [name] 40-10-25 MG TABS Take 1 tablet by mouth daily.    No facility-administered encounter medications on file as of 12/04/2017.   :  Review of Systems:  Out of a complete 14 point review of systems,  all are reviewed and negative with the exception of these symptoms as listed below: Review of Systems  Neurological:       Pt presents today to discuss his sleep. Pt has never had a sleep study but does endorse snoring.  Epworth Sleepiness Scale 0= would never doze 1= slight chance of dozing 2= moderate chance of dozing 3= high chance of dozing  Sitting and reading: 2 Watching TV: 3 Sitting inactive in a public place (ex. Theater or meeting): 1 As a passenger in a car for an hour without a break: 1 Lying down to rest in the afternoon: 3 Sitting and talking to someone: 0 Sitting quietly after lunch (no alcohol): 1 In a car, while stopped in traffic: 1 Total: 12     Objective:  Neurological Exam  Physical Exam Physical Examination:   Vitals:   12/04/17 0845  BP: (!) 188/87  Pulse: (!) 56    General Examination: The patient is a very pleasant 57 y.o. male in no acute distress. He appears well-developed and well-nourished and well groomed.   HEENT: Normocephalic, atraumatic, pupils are equal, round and reactive to light and accommodation. Corrective eyeglasses in place. Extraocular tracking is good without limitation to gaze excursion or nystagmus noted. Normal smooth pursuit is noted. Hearing is grossly intact. Face is symmetric with normal facial animation and normal facial sensation.  Speech is clear with no dysarthria noted. There is no hypophonia. There is no lip, neck/head, jaw or voice tremor. Neck is supple with full range of passive and active motion. There are no carotid bruits on auscultation. Oropharynx exam reveals: mild mouth dryness, adequate dental hygiene and moderate airway crowding, due to redundant soft palate and larger uvula. Mallampati is class III. Tongue protrudes centrally and palate elevates symmetrically. Tonsils are absent. Neck size is 18.25 inches. He has a minimal overbite.   Chest: Clear to auscultation without wheezing, rhonchi or crackles noted.  Heart: S1+S2+0, regular and normal without murmurs, rubs or gallops noted.   Abdomen: Soft, non-tender and non-distended with normal bowel sounds appreciated on auscultation.  Extremities: There is 1+ pitting edema in the distal lower extremities bilaterally. Pedal pulses are intact.  Skin: Warm and dry without trophic changes noted.  Musculoskeletal: exam reveals no obvious joint deformities, tenderness or joint swelling or erythema.   Neurologically:  Mental status: The patient is awake, alert and oriented in all 4 spheres. His immediate and remote memory, attention, language skills and fund of knowledge are appropriate. There is no evidence of aphasia, agnosia, apraxia or anomia. Speech is clear with normal prosody and enunciation. Thought process is linear. Mood is normal and affect is normal.  Cranial nerves II - XII are as described above under HEENT exam. In addition: shoulder shrug is normal with equal shoulder height noted. Motor exam: Normal bulk, strength and tone is noted. There is no drift, tremor or rebound. Romberg is negative. Reflexes are 1+. Fine motor skills and coordination: grossly intact.  Cerebellar testing: No dysmetria or intention tremor on finger to nose testing. Heel to shin is unremarkable bilaterally. There is no truncal or gait ataxia.  Sensory exam: intact to light touch  in the upper and lower extremities.  Gait, station and balance: He stands easily. No veering to one side is noted. No leaning to one side is noted. Posture is age-appropriate and stance is narrow based. Gait shows normal stride length and normal pace. No problems turning are noted. Tandem walk is unremarkable.    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 fib with recent status post cardioversion on 12/02/17, whose history and physical exam are concerning for obstructive sleep apnea (OSA).  I had a long chat with the patient about my findings and the diagnosis of OSA, its prognosis and treatment options. We talked about medical treatments, surgical interventions and non-pharmacological approaches. I explained in particular the risks and ramifications of untreated moderate to severe OSA, especially with respect to developing cardiovascular disease down the Road, including congestive heart failure, difficult to treat hypertension, cardiac arrhythmias, or stroke. Even type 2 diabetes has, in part, been linked to untreated OSA. Symptoms of untreated OSA include daytime sleepiness, memory problems, mood irritability and mood disorder such as depression and anxiety, lack of energy, as well as recurrent headaches, especially morning headaches. We talked about trying to maintain a healthy lifestyle in general, as well as the importance of weight control. I encouraged the patient to eat healthy, exercise daily and keep well hydrated, to keep a scheduled bedtime and wake time routine, to not skip any meals and eat healthy snacks in between meals. I advised the patient not to drive when feeling sleepy.  I recommended the  following at this time: sleep study with potential positive airway pressure titration. (We will score hypopneas at 3%).   I explained the sleep test procedure to the patient and also outlined possible surgical and non-surgical treatment options of OSA, including the use of a custom-made dental device (which would require a referral to a specialist dentist or oral surgeon), upper airway surgical options, such as pillar implants, radiofrequency surgery, tongue base surgery, and UPPP (which would involve a referral to an ENT surgeon). Rarely, jaw surgery such as mandibular advancement may be considered.  I also explained the CPAP treatment option to the patient, who indicated that he would be willing to try CPAP if the need arises. I explained the importance of being compliant with PAP treatment, not only for insurance purposes but primarily to improve His symptoms, and for the patient's long term health benefit, including to reduce His cardiovascular risks. I answered all his questions today and the patient was in agreement. I would like to see him back after the sleep study is completed and encouraged him to call with any interim questions, concerns, problems or updates.   Thank you very much for allowing me to participate in the care of this nice patient. If I can be of any further assistance to you please do not hesitate to call me at [phone].  Sincerely,   [name], MD, PhD

## 2017-12-04 NOTE — Patient Instructions (Signed)

## 2017-12-12 ENCOUNTER — Ambulatory Visit (HOSPITAL_COMMUNITY)
Admission: RE | Admit: 2017-12-12 | Discharge: 2017-12-12 | Disposition: A | Discharge status: Home / Self Care | Payer: BC Managed Care – PPO | Source: Ambulatory Visit | Attending: Nurse Practitioner | Admitting: Nurse Practitioner

## 2017-12-12 ENCOUNTER — Encounter (HOSPITAL_COMMUNITY): Payer: Self-pay | Admitting: Nurse Practitioner

## 2017-12-12 VITALS — BP 148/76 | HR 52 | Ht 67.0 in | Wt 254.0 lb

## 2017-12-12 DIAGNOSIS — Z882 Allergy status to sulfonamides status: Secondary | ICD-10-CM | POA: Insufficient documentation

## 2017-12-12 DIAGNOSIS — Z833 Family history of diabetes mellitus: Secondary | ICD-10-CM | POA: Diagnosis not present

## 2017-12-12 DIAGNOSIS — Z808 Family history of malignant neoplasm of other organs or systems: Secondary | ICD-10-CM | POA: Insufficient documentation

## 2017-12-12 DIAGNOSIS — Z79899 Other long term (current) drug therapy: Secondary | ICD-10-CM | POA: Insufficient documentation

## 2017-12-12 DIAGNOSIS — Z7901 Long term (current) use of anticoagulants: Secondary | ICD-10-CM | POA: Insufficient documentation

## 2017-12-12 DIAGNOSIS — K219 Gastro-esophageal reflux disease without esophagitis: Secondary | ICD-10-CM | POA: Insufficient documentation

## 2017-12-12 DIAGNOSIS — I4819 Other persistent atrial fibrillation: Secondary | ICD-10-CM | POA: Diagnosis present

## 2017-12-12 DIAGNOSIS — Z7982 Long term (current) use of aspirin: Secondary | ICD-10-CM | POA: Insufficient documentation

## 2017-12-12 DIAGNOSIS — I2510 Atherosclerotic heart disease of native coronary artery without angina pectoris: Secondary | ICD-10-CM | POA: Insufficient documentation

## 2017-12-12 DIAGNOSIS — I4811 Longstanding persistent atrial fibrillation: Secondary | ICD-10-CM | POA: Diagnosis not present

## 2017-12-12 DIAGNOSIS — Z794 Long term (current) use of insulin: Secondary | ICD-10-CM | POA: Insufficient documentation

## 2017-12-12 DIAGNOSIS — I493 Ventricular premature depolarization: Secondary | ICD-10-CM | POA: Insufficient documentation

## 2017-12-12 DIAGNOSIS — Z888 Allergy status to other drugs, medicaments and biological substances status: Secondary | ICD-10-CM | POA: Insufficient documentation

## 2017-12-12 DIAGNOSIS — Z8249 Family history of ischemic heart disease and other diseases of the circulatory system: Secondary | ICD-10-CM | POA: Insufficient documentation

## 2017-12-12 DIAGNOSIS — I10 Essential (primary) hypertension: Secondary | ICD-10-CM | POA: Diagnosis not present

## 2017-12-12 DIAGNOSIS — E119 Type 2 diabetes mellitus without complications: Secondary | ICD-10-CM | POA: Insufficient documentation

## 2017-12-12 DIAGNOSIS — E785 Hyperlipidemia, unspecified: Secondary | ICD-10-CM | POA: Insufficient documentation

## 2017-12-12 NOTE — Patient Instructions (Signed)
[place] [address]

## 2017-12-16 NOTE — Progress Notes (Signed)
Primary Care Physician: [name], MD Primary Cardiologist: Gwenlyn Found Referring Physician: Tristan Proto is a 57 y.o. male with a history of persistent atrial fibrillation who presents for consultation in the [facility].  The patient was initially diagnosed with atrial fibrillation 2019 after presenting with symptoms of fatigue.  He was started on Eliquis and had successful cardioversion 12/02/17.  He presents today for follow up.  Since cardioversion, he thinks he is somewhat improved. He did not have palpitations or other typical AF symptoms. He has been compliant with Eliquis with no missed doses. He has been playing "phone tag" with sleep study center.   Today, he denies symptoms of palpitations, chest pain, shortness of breath, orthopnea, PND, lower extremity edema, dizziness, presyncope, syncope, snoring, daytime somnolence, bleeding, or neurologic sequela. The patient is tolerating medications without difficulties and is otherwise without complaint today.    Atrial Fibrillation Risk Factors:  he does have symptoms or diagnosis of sleep apnea. Sleep study scheduled for 10/18  he does not have a history of rheumatic fever.  he does not have a history of alcohol use.  he has a BMI of Body mass index is 39.78 kg/m.Marland Kitchen Filed Weights   12/12/17 1532  Weight: 115.2 kg    LA size: 42   Atrial Fibrillation Management history:  Previous antiarrhythmic drugs: none  Previous cardioversions: 11/2017  Previous ablations: none  CHADS2VASC score: 2  Anticoagulation history: Eliquis   Past Medical History:  Diagnosis Date  . Coronary artery disease    LEXISCAN, 01/05/2011 - defect seen in Basal Inferior, Mid Inferior, and Apical Lateral region(s)-consistent with infarct/scar, global LV systolic function mildly reduced, abnormal study  . GERD (gastroesophageal reflux disease)   . Hyperlipidemia   . Hypertension    2D ECHO, 10/10/2010 -  EF >55%, normal  . Insulin dependent diabetes mellitus ([place])   . Pseudoaneurysm ([place])    LEA DOPPLER, 11/01/2010 - right groin hematoma measuring 2.47x0.62x1.61 centimeters in greatest diameter   Past Surgical History:  Procedure Laterality Date  . CARDIAC CATHETERIZATION  10/24/2010   CTI - 3030 Resolute drug-eluting stent, 3.71mm resulting reduction og a long CT to 0% residual with excellent flow.  Marland Kitchen CARDIOVERSION N/A 12/02/2017   Procedure: CARDIOVERSION;  Surgeon: Larey Dresser, MD;  Location: [facility] ENDOSCOPY;  Service: Cardiovascular;  Laterality: N/A;    Current Outpatient Medications  Medication Sig Dispense Refill  . apixaban (ELIQUIS) 5 MG TABS tablet Take 5 mg by mouth 2 (two) times daily.    Marland Kitchen aspirin EC 81 MG tablet Take 1 tablet (81 mg total) by mouth daily. (Patient taking differently: Take 81 mg by mouth every evening. ) 90 tablet 3  . BD PEN NEEDLE NANO U/F 32G X 4 MM MISC     . BYSTOLIC 10 MG tablet TAKE 1 TABLET EVERY EVENING (Patient taking differently: Take 10 mg by mouth every evening. ) 30 tablet 2  . Coenzyme Q10 (CO Q-10) 200 MG CAPS Take 200 mg by mouth daily.     . dapagliflozin propanediol (FARXIGA) 10 MG TABS tablet Take 10 mg by mouth daily.    . Evolocumab (REPATHA SURECLICK) 681 MG/ML SOAJ Inject 140 mg into the skin every 14 (fourteen) days. 2 pen 12  . Insulin Aspart (NOVOLOG FLEXPEN [place]) Inject 20 Units into the skin 3 (three) times daily with meals.     . mupirocin ointment (BACTROBAN) 2 % Place 1 application into the nose 2 (two)  times daily.    Marland Kitchen omeprazole (PRILOSEC) 20 MG capsule Take 20 mg by mouth daily.    . ONE TOUCH ULTRA TEST test strip     . ONETOUCH DELICA LANCETS 09Q MISC Apply 1 strip topically 3 (three) times daily.  8  . Potassium Chloride ER 20 MEQ TBCR Take 20 mEq by mouth 3 (three) times daily.     . TRESIBA FLEXTOUCH 200 UNIT/ML SOPN Inject 80 Units as directed daily.   6  . triamcinolone cream (KENALOG) 0.1 % Apply 1 application  topically 2 (two) times daily.    [name] 40-10-25 MG TABS Take 1 tablet by mouth daily.     . nitroGLYCERIN (NITROSTAT) 0.4 MG SL tablet PLACE 1 TABLET (0.4 MG TOTAL) UNDER THE TONGUE EVERY 5 (FIVE) MINUTES AS NEEDED FOR CHEST PAIN. (Patient not taking: Reported on 12/12/2017) 25 tablet 3   No current facility-administered medications for this encounter.     Allergies  Allergen Reactions  . Norvasc [Amlodipine Besylate] Other (See Comments)    Edema  . Sulfa Antibiotics Hives    Social History   Socioeconomic History  . Marital status: Single    Spouse name: Not on file  . Number of children: Not on file  . Years of education: Not on file  . Highest education level: Not on file  Occupational History  . Not on file  Social Needs  . Financial resource strain: Not on file  . Food insecurity:    Worry: Not on file    Inability: Not on file  . Transportation needs:    Medical: Not on file    Non-medical: Not on file  Tobacco Use  . Smoking status: Never Smoker  . Smokeless tobacco: Never Used  Substance and Sexual Activity  . Alcohol use: Yes    Comment: occassionally  . Drug use: No  . Sexual activity: Not on file  Lifestyle  . Physical activity:    Days per week: Not on file    Minutes per session: Not on file  . Stress: Not on file  Relationships  . Social connections:    Talks on phone: Not on file    Gets together: Not on file    Attends religious service: Not on file    Active member of club or organization: Not on file    Attends meetings of clubs or organizations: Not on file    Relationship status: Not on file  . Intimate partner violence:    Fear of current or ex partner: Not on file    Emotionally abused: Not on file    Physically abused: Not on file    Forced sexual activity: Not on file  Other Topics Concern  . Not on file  Social History Narrative  . Not on file    Family History  Problem Relation Age of Onset  . Heart disease Mother 11         Stent placement  . Diabetes Mother   . Diabetes Sister   . Thyroid cancer Brother        2010  . Prostate cancer Maternal Grandfather   . Emphysema Paternal Grandfather    The patient does not have a history of early familial atrial fibrillation or other arrhythmias.  ROS- All systems are reviewed and negative except as per the HPI above.  Physical Exam: Vitals:   12/12/17 1532  BP: (!) 148/76  Pulse: (!) 52  Weight: 115.2 kg  Height: 5\' 7"  (  1.702 m)    GEN- The patient is well appearing, alert and oriented x 3 today.   Head- normocephalic, atraumatic Eyes-  Sclera clear, conjunctiva pink Ears- hearing intact Oropharynx- clear Neck- supple  Lungs- Clear to ausculation bilaterally, normal work of breathing Heart- Regular rate and rhythm  GI- soft, NT, ND, + BS Extremities- no clubbing, cyanosis, or edema MS- no significant deformity or atrophy Skin- no rash or lesion Psych- euthymic mood, full affect Neuro- strength and sensation are intact  Wt Readings from Last 3 Encounters:  12/12/17 115.2 kg  12/04/17 115.2 kg  11/20/17 115.5 kg    EKG today demonstrates sinus rhythm, bigeminal PVC's, rate 52  Epic records are reviewed at length today  Assessment and Plan:  1. Persistent atrial fibrillation Doing well post cardioversion As he had no real symptoms of AF outside of some increased fatigue, we reviewed today that the [facility] app or apple watch could help with determining recurrence.  Continue Eliquis for CHADS2VASC of 2 We reviewed lifestyle modification at length today including regular exercise and weight loss, compliance with CPAP if found to have sleep apnea. Nutrition flyer given today  2.  PVCs Newly identified today Will follow If persistent at follow up, consider 24 hour holter monitor to quantify burden   Follow up in 6 weeks with AF clinic to review sleep study results, determine if any recurrence of AF   Chanetta Marshall, NP 12/16/2017 9:59  AM

## 2017-12-27 ENCOUNTER — Ambulatory Visit (INDEPENDENT_AMBULATORY_CARE_PROVIDER_SITE_OTHER): Payer: BC Managed Care – PPO | Admitting: Neurology

## 2017-12-27 DIAGNOSIS — G4733 Obstructive sleep apnea (adult) (pediatric): Secondary | ICD-10-CM | POA: Diagnosis not present

## 2017-12-27 DIAGNOSIS — R0683 Snoring: Secondary | ICD-10-CM

## 2017-12-27 DIAGNOSIS — G4719 Other hypersomnia: Secondary | ICD-10-CM

## 2017-12-27 DIAGNOSIS — M25471 Effusion, right ankle: Secondary | ICD-10-CM

## 2017-12-27 DIAGNOSIS — G4720 Circadian rhythm sleep disorder, unspecified type: Secondary | ICD-10-CM

## 2017-12-27 DIAGNOSIS — M25472 Effusion, left ankle: Secondary | ICD-10-CM

## 2017-12-27 DIAGNOSIS — R351 Nocturia: Secondary | ICD-10-CM

## 2017-12-27 DIAGNOSIS — Z955 Presence of coronary angioplasty implant and graft: Secondary | ICD-10-CM

## 2017-12-27 DIAGNOSIS — E669 Obesity, unspecified: Secondary | ICD-10-CM

## 2017-12-27 DIAGNOSIS — I4891 Unspecified atrial fibrillation: Secondary | ICD-10-CM

## 2018-01-05 ENCOUNTER — Telehealth: Payer: Self-pay | Admitting: Pharmacist Clinician (PhC)/ Clinical Pharmacy Specialist

## 2018-01-05 DIAGNOSIS — E7800 Pure hypercholesterolemia, unspecified: Secondary | ICD-10-CM

## 2018-01-05 NOTE — Telephone Encounter (Signed)
Labs for [place] PA continuation

## 2018-01-08 ENCOUNTER — Telehealth: Payer: Self-pay

## 2018-01-08 NOTE — Telephone Encounter (Signed)
I called pt. I advised pt that Dr. Rexene Alberts reviewed their sleep study results and found that pt pt has severe osa but did well with the cpap during his sleep study. Dr. Rexene Alberts recommends that pt start a cpap at home. I reviewed PAP compliance expectations with the pt. Pt is agreeable to starting a CPAP. I advised pt that an order will be sent to a DME, [facility], and [facility] will call the pt within about one week after they file with the pt's insurance. [facility] will show the pt how to use the machine, fit for masks, and troubleshoot the CPAP if needed. A follow up appt was made for insurance purposes with Dr. Rexene Alberts on 03/31/18 at 1:00pm. Pt verbalized understanding to arrive 15 minutes early and bring their CPAP. A letter with all of this information in it will be mailed to the pt as a reminder. I verified with the pt that the address we have on file is correct. Pt verbalized understanding of results. Pt had no questions at this time but was encouraged to call back if questions arise. I have sent the order to [facility] and have received confirmation that they have received the order.

## 2018-01-08 NOTE — Procedures (Signed)
PATIENT'S NAME:  Bradley Holmes, Bradley Holmes DOB:      11-22-1960      MR#:    [id number]     DATE OF RECORDING: 12/27/2017 REFERRING M.D.:  [name], MD Study Performed:  Split-Night Titration Study HISTORY: 57 year old man with a history of type 2 diabetes (with diabetic retinopathy), heart murmur, reflux disease, hypertension, coronary artery disease (status post DES), obesity, and recent onset A. fib (status post cardioversion), who reports snoring and excessive daytime somnolence. The patient endorsed the Epworth Sleepiness Scale at 12/24 points. The patient's weight 254 pounds with a height of 67 (inches), resulting in a BMI of 39.8 kg/m2. The patient's neck circumference measured 18.2 inches.  CURRENT MEDICATIONS: Eliquis, Aspirin, BD Pen Needle, Bystolic, CO W-09, Farxiga, Repatha Sureclick, Novolog [place], [place], [place], Prilosec, One Touch Ultra Test, Lancets, Potassium Chloride, Tyler Aas Flextouch, Kenalog, Tribenzor.   PROCEDURE:  This is a multichannel digital polysomnogram utilizing the Somnostar 11.2 system.  Electrodes and sensors were applied and monitored per AASM Specifications.   EEG, EOG, Chin and Limb EMG, were sampled at 200 Hz.  ECG, Snore and Nasal Pressure, Thermal Airflow, Respiratory Effort, CPAP Flow and Pressure, Oximetry was sampled at 50 Hz. Digital video and audio were recorded.      BASELINE STUDY WITHOUT CPAP RESULTS:  Lights Out was at 21:48 and Lights On at 05:33 for the night, split start at epoch 494, 01:51. Total recording time (TRT) was 243, with a total sleep time (TST) of 110.5 minutes.  The patient's sleep latency was norma.  REM sleep was absent. The sleep efficiency was 45.5%, which is markedly reduced.    SLEEP ARCHITECTURE: WASO (Wake after sleep onset) was 102.5 minutes, with severe sleep fragmentation noted. Stage N1 was 40.5 minutes, Stage N2 was 70 minutes, Stage N3 was 0 minutes and Stage R (REM sleep) was 0 minutes.  The percentages were Stage  N1 36.7%, Stage N2 63.3%, both increased, Stage N3 and Stage R (REM sleep) were absent. The arousals were noted as: 20 were spontaneous, 0 were associated with PLMs, 78 were associated with respiratory events.  RESPIRATORY ANALYSIS:  There were a total of 180 respiratory events:  49 obstructive apneas, 17 central apneas and 0 mixed apneas with a total of 66 apneas and an apnea index (AI) of 35.8. There were 114 hypopneas with a hypopnea index of 61.9. The patient also had 0 respiratory event related arousals (RERAs).  Snoring was noted.     The total APNEA/HYPOPNEA INDEX (AHI) was 97.7 /hour and the total RESPIRATORY DISTURBANCE INDEX was 97.7 /hour.  0 events occurred in REM sleep and 245 events in NREM. The REM AHI was 0, /hour versus a non-REM AHI of 97.7 /hour. The patient spent 36.5 minutes sleep time in the supine position 184 minutes in non-supine. The supine AHI was 96.0 /hour versus a non-supine AHI of 97.9 /hour.  OXYGEN SATURATION & C02:  The wake baseline 02 saturation was 93%, with the lowest being 84%. Time spent below 89% saturation equaled 49 minutes. PERIODIC LIMB MOVEMENTS: The patient had a total of 0 Periodic Limb Movements.  The Periodic Limb Movement (PLM) index was 0 /hour and the PLM Arousal index was 0 /hour.  Audio and video analysis did not show any abnormal or unusual movements, behaviors, phonations or vocalizations. The patient took 2 bathroom breaks. Moderate snoring was noted. The EKG was in keeping with normal sinus rhythm (NSR).   TITRATION STUDY WITH CPAP RESULTS:   The patient  was fitted with a medium Simplus FFM, due to mouth venting noted. CPAP was initiated at 5 cmH20 with heated humidity per AASM split night standards and pressure was advanced to 11 cmH20 because of hypopneas, apneas and desaturations.  At a PAP pressure of 11 cmH20, there was a reduction of the AHI to 0 /hour with non-supine NREM sleep noted and O2 nadir of 92%.   Total recording time (TRT)  was 223 minutes, with a total sleep time (TST) of 109.5 minutes. The patient's sleep latency was 142.5 minutes. REM latency was 152.5 minutes.  The sleep efficiency was 49.1 %.    SLEEP ARCHITECTURE: Wake after sleep was 102 minutes, Stage N1 22.5 minutes, Stage N2 35 minutes, Stage N3 34.5 minutes and Stage R (REM sleep) 17.5 minutes. The percentages were: Stage N1 20.5%, Stage N2 32.%, Stage N3 31.5%, which is increased and Stage R (REM sleep) 16.%. The arousals were noted as: 12 were spontaneous, 0 were associated with PLMs, 25 were associated with respiratory events.  RESPIRATORY ANALYSIS:  There were a total of 56 respiratory events: 12 obstructive apneas, 43 central apneas and 0 mixed apneas with a total of 55 apneas and an apnea index (AI) of 30.1. There were 1 hypopneas with a hypopnea index of .5 /hour. The patient also had 0 respiratory event related arousals (RERAs).      The total APNEA/HYPOPNEA INDEX  (AHI) was 30.7 /hour and the total RESPIRATORY DISTURBANCE INDEX was 30.7 /hour.  0 events occurred in REM sleep and 56 events in NREM. The REM AHI was 0 /hour versus a non-REM AHI of 36.5 /hour. REM sleep was achieved on a pressure of  cm/h2o (AHI was  .) The patient spent 24% of total sleep time in the supine position. The supine AHI was 101.9 /hour, versus a non-supine AHI of 7.9/hour.  OXYGEN SATURATION & C02:  The wake baseline 02 saturation was 98%, with the lowest being 80%. Time spent below 89% saturation equaled 11 minutes.  PERIODIC LIMB MOVEMENTS: The patient had a total of 0 Periodic Limb Movements. The Periodic Limb Movement (PLM) index was 0 /hour and the PLM Arousal index was 0 /hour.  Post-study, the patient indicated that sleep was worse than usual.  POLYSOMNOGRAPHY IMPRESSION :   1. Obstructive Sleep Apnea (OSA)  2. Dysfunctions associated with sleep stages or arousals from sleep RECOMMENDATIONS:  1. This patient has severe obstructive sleep apnea and responded well on  CPAP therapy. Given, that he achieved only non-supine and NREM sleep on the final titration pressure of 11 cm, I will recommend a home CPAP treatment pressure of 12 cm via medium FFM, with heated humidity. The patient should be reminded to be fully compliant with PAP therapy to improve sleep related symptoms and decrease long term cardiovascular risks. Please note that untreated obstructive sleep apnea may carry additional perioperative morbidity. Patients with significant obstructive sleep apnea should receive perioperative PAP therapy and the surgeons and particularly the anesthesiologist should be informed of the diagnosis and the severity of the sleep disordered breathing. 2. This study shows significant sleep fragmentation and abnormal sleep stage percentages; these are nonspecific findings and per se do not signify an intrinsic sleep disorder or a cause for the patient's sleep-related symptoms. Causes include (but are not limited to) the first night effect of the sleep study, circadian rhythm disturbances, medication effect or an underlying mood disorder or medical problem.  3. The patient should be cautioned not to drive, work at heights,  or operate dangerous or heavy equipment when tired or sleepy. Review and reiteration of good sleep hygiene measures should be pursued with any patient. 4. The patient will be seen in follow-up in the sleep clinic at [facility] for discussion of the test results, symptom and treatment compliance review, further management strategies, etc. The referring provider will be notified of the test results.  I certify that I have reviewed the entire raw data recording prior to the issuance of this report in accordance with the Standards of Accreditation of the American Academy of Sleep Medicine (AASM)    [name], MD, PhD Diplomat, American Board of Neurology and Sleep Medicine (Neurology and Sleep Medicine)

## 2018-01-08 NOTE — Telephone Encounter (Signed)
-----   Message from [name], MD sent at 01/08/2018  8:09 AM EDT ----- Patient referred by Dr. Dagmar Hait, seen by me on 12/04/17, split night sleep study on 12/27/17. Please call and notify patient that the recent sleep study confirmed the diagnosis of severe OSA. He did well with CPAP during the study with significant improvement of the respiratory events. Therefore, I would like start the patient on CPAP therapy at home by prescribing a machine for home use. I placed the order in the chart.  Please advise patient that we need a follow up appointment with either myself or one of our nurse practitioners in about 10 weeks post set-up to check for how the patient is feeling and how well the patient is using the machine, etc. Please go ahead and schedule the appointment, while you have the patient on the phone and make sure patient understands the importance of keeping this window for the FU appointment, as it is often an insurance requirement. Failing to adhere to this may result in losing coverage for sleep apnea treatment, at which point most patients are left with a choice of returning the machine or paying out of pocket (and we want neither of this to happen!).  Please re-enforce the importance of compliance with treatment and the need for [place] to monitor compliance data - again an insurance requirement and usually a good feedback for the patient as far as how they are doing.  Also remind patient, that any PAP machine or mask issues should be first addressed with the DME company, who provided the machine/mask.  Please ask if patient has a preference regarding DME company, may depend on the insurance too.  Please arrange for CPAP set up at home through a DME company of patient's choice.  Once you have spoken to the patient you can close the phone encounter. Please fax/route report to referring provider, thanks,   [name], MD, PhD [facility])

## 2018-01-08 NOTE — Addendum Note (Signed)
Addended by: [name] on: 01/08/2018 08:09 AM   Modules accepted: Orders

## 2018-01-08 NOTE — Progress Notes (Signed)
Patient referred by Dr. Dagmar Hait, seen by me on 12/04/17, split night sleep study on 12/27/17. Please call and notify patient that the recent sleep study confirmed the diagnosis of severe OSA. He did well with CPAP during the study with significant improvement of the respiratory events. Therefore, I would like start the patient on CPAP therapy at home by prescribing a machine for home use. I placed the order in the chart.  Please advise patient that we need a follow up appointment with either myself or one of our nurse practitioners in about 10 weeks post set-up to check for how the patient is feeling and how well the patient is using the machine, etc. Please go ahead and schedule the appointment, while you have the patient on the phone and make sure patient understands the importance of keeping this window for the FU appointment, as it is often an insurance requirement. Failing to adhere to this may result in losing coverage for sleep apnea treatment, at which point most patients are left with a choice of returning the machine or paying out of pocket (and we want neither of this to happen!).  Please re-enforce the importance of compliance with treatment and the need for [place] to monitor compliance data - again an insurance requirement and usually a good feedback for the patient as far as how they are doing.  Also remind patient, that any PAP machine or mask issues should be first addressed with the DME company, who provided the machine/mask.  Please ask if patient has a preference regarding DME company, may depend on the insurance too.  Please arrange for CPAP set up at home through a DME company of patient's choice.  Once you have spoken to the patient you can close the phone encounter. Please fax/route report to referring provider, thanks,   [name], MD, PhD [facility])

## 2018-01-22 ENCOUNTER — Ambulatory Visit (HOSPITAL_COMMUNITY)
Admission: RE | Admit: 2018-01-22 | Discharge: 2018-01-22 | Disposition: A | Discharge status: Home / Self Care | Payer: BC Managed Care – PPO | Source: Ambulatory Visit | Attending: Nurse Practitioner | Admitting: Nurse Practitioner

## 2018-01-22 ENCOUNTER — Encounter (HOSPITAL_COMMUNITY): Payer: Self-pay | Admitting: Nurse Practitioner

## 2018-01-22 VITALS — BP 138/76 | HR 55 | Ht 67.0 in | Wt 257.0 lb

## 2018-01-22 DIAGNOSIS — I2510 Atherosclerotic heart disease of native coronary artery without angina pectoris: Secondary | ICD-10-CM | POA: Insufficient documentation

## 2018-01-22 DIAGNOSIS — Z79899 Other long term (current) drug therapy: Secondary | ICD-10-CM | POA: Insufficient documentation

## 2018-01-22 DIAGNOSIS — I444 Left anterior fascicular block: Secondary | ICD-10-CM | POA: Diagnosis not present

## 2018-01-22 DIAGNOSIS — I10 Essential (primary) hypertension: Secondary | ICD-10-CM | POA: Insufficient documentation

## 2018-01-22 DIAGNOSIS — Z794 Long term (current) use of insulin: Secondary | ICD-10-CM | POA: Diagnosis not present

## 2018-01-22 DIAGNOSIS — Z7982 Long term (current) use of aspirin: Secondary | ICD-10-CM | POA: Insufficient documentation

## 2018-01-22 DIAGNOSIS — I4510 Unspecified right bundle-branch block: Secondary | ICD-10-CM | POA: Diagnosis not present

## 2018-01-22 DIAGNOSIS — Z882 Allergy status to sulfonamides status: Secondary | ICD-10-CM | POA: Diagnosis not present

## 2018-01-22 DIAGNOSIS — I480 Paroxysmal atrial fibrillation: Secondary | ICD-10-CM | POA: Diagnosis not present

## 2018-01-22 DIAGNOSIS — K219 Gastro-esophageal reflux disease without esophagitis: Secondary | ICD-10-CM | POA: Diagnosis not present

## 2018-01-22 DIAGNOSIS — Z7901 Long term (current) use of anticoagulants: Secondary | ICD-10-CM | POA: Diagnosis not present

## 2018-01-22 DIAGNOSIS — E785 Hyperlipidemia, unspecified: Secondary | ICD-10-CM | POA: Insufficient documentation

## 2018-01-22 DIAGNOSIS — I4819 Other persistent atrial fibrillation: Secondary | ICD-10-CM | POA: Diagnosis present

## 2018-01-22 DIAGNOSIS — G4730 Sleep apnea, unspecified: Secondary | ICD-10-CM | POA: Insufficient documentation

## 2018-01-22 DIAGNOSIS — Z8249 Family history of ischemic heart disease and other diseases of the circulatory system: Secondary | ICD-10-CM | POA: Diagnosis not present

## 2018-01-22 DIAGNOSIS — E119 Type 2 diabetes mellitus without complications: Secondary | ICD-10-CM | POA: Insufficient documentation

## 2018-01-22 NOTE — Progress Notes (Signed)
Primary Care Physician: [name], MD Primary Cardiologist: Gwenlyn Found Referring Physician: Sonnie Pawloski is a 57 y.o. male with a history of persistent atrial fibrillation who presents for f/u in the [facility].  The patient was initially diagnosed with atrial fibrillation 2019 after presenting with symptoms of fatigue.  He was started on Eliquis and had successful cardioversion 12/02/17.   Since cardioversion, he has not noted any afib. He now has a Chad.  He has been compliant with Eliquis with no missed doses. He had a positive sleep study and is now on cpap x one week.  Today, he denies symptoms of palpitations, chest pain, shortness of breath, orthopnea, PND, lower extremity edema, dizziness, presyncope, syncope, snoring, daytime somnolence, bleeding, or neurologic sequela. The patient is tolerating medications without difficulties and is otherwise without complaint today.    Atrial Fibrillation Risk Factors:  he does have symptoms or diagnosis of sleep apnea. Positive sleep study and is on cpap   he does not have a history of rheumatic fever.  he does not have a history of alcohol use.  he has a BMI of Body mass index is 40.25 kg/m.Marland Kitchen Filed Weights   01/22/18 1529  Weight: 116.6 kg    LA size: 42   Atrial Fibrillation Management history:  Previous antiarrhythmic drugs: none  Previous cardioversions: 11/2017  Previous ablations: none  CHADS2VASC score: 2  Anticoagulation history: Eliquis   Past Medical History:  Diagnosis Date  . Coronary artery disease    LEXISCAN, 01/05/2011 - defect seen in Basal Inferior, Mid Inferior, and Apical Lateral region(s)-consistent with infarct/scar, global LV systolic function mildly reduced, abnormal study  . GERD (gastroesophageal reflux disease)   . Hyperlipidemia   . Hypertension    2D ECHO, 10/10/2010 - EF >55%, normal  . Insulin dependent diabetes mellitus ([place])   .  Pseudoaneurysm ([place])    LEA DOPPLER, 11/01/2010 - right groin hematoma measuring 2.47x0.62x1.61 centimeters in greatest diameter   Past Surgical History:  Procedure Laterality Date  . CARDIAC CATHETERIZATION  10/24/2010   [facility]I - 3030 Resolute drug-eluting stent, 3.15mm resulting reduction og a long CT to 0% residual with excellent flow.  Marland Kitchen CARDIOVERSION N/A 12/02/2017   Procedure: CARDIOVERSION;  Surgeon: Larey Dresser, MD;  Location: [facility] ENDOSCOPY;  Service: Cardiovascular;  Laterality: N/A;    Current Outpatient Medications  Medication Sig Dispense Refill  . apixaban (ELIQUIS) 5 MG TABS tablet Take 5 mg by mouth 2 (two) times daily.    Marland Kitchen aspirin EC 81 MG tablet Take 1 tablet (81 mg total) by mouth daily. (Patient taking differently: Take 81 mg by mouth every evening. ) 90 tablet 3  . BD PEN NEEDLE NANO U/F 32G X 4 MM MISC     . BYSTOLIC 10 MG tablet TAKE 1 TABLET EVERY EVENING (Patient taking differently: Take 10 mg by mouth every evening. ) 30 tablet 2  . Coenzyme Q10 (CO Q-10) 200 MG CAPS Take 200 mg by mouth daily.     . dapagliflozin propanediol (FARXIGA) 10 MG TABS tablet Take 10 mg by mouth daily.    . Evolocumab (REPATHA SURECLICK) 229 MG/ML SOAJ Inject 140 mg into the skin every 14 (fourteen) days. 2 pen 12  . Insulin Aspart (NOVOLOG FLEXPEN [place]) Inject 20 Units into the skin 3 (three) times daily with meals.     . mupirocin ointment (BACTROBAN) 2 % Place 1 application into the nose 2 (two) times daily.    Marland Kitchen  nitroGLYCERIN (NITROSTAT) 0.4 MG SL tablet PLACE 1 TABLET (0.4 MG TOTAL) UNDER THE TONGUE EVERY 5 (FIVE) MINUTES AS NEEDED FOR CHEST PAIN. 25 tablet 3  . omeprazole (PRILOSEC) 20 MG capsule Take 20 mg by mouth daily.    . ONE TOUCH ULTRA TEST test strip     . ONETOUCH DELICA LANCETS 00Q MISC Apply 1 strip topically 3 (three) times daily.  8  . Potassium Chloride ER 20 MEQ TBCR Take 20 mEq by mouth 3 (three) times daily.     . TRESIBA FLEXTOUCH 200 UNIT/ML SOPN Inject 80 Units as  directed daily.   6  . triamcinolone cream (KENALOG) 0.1 % Apply 1 application topically 2 (two) times daily.    [name] 40-10-25 MG TABS Take 1 tablet by mouth daily.      No current facility-administered medications for this encounter.     Allergies  Allergen Reactions  . Norvasc [Amlodipine Besylate] Other (See Comments)    Edema  . Sulfa Antibiotics Hives    Social History   Socioeconomic History  . Marital status: Single    Spouse name: Not on file  . Number of children: Not on file  . Years of education: Not on file  . Highest education level: Not on file  Occupational History  . Not on file  Social Needs  . Financial resource strain: Not on file  . Food insecurity:    Worry: Not on file    Inability: Not on file  . Transportation needs:    Medical: Not on file    Non-medical: Not on file  Tobacco Use  . Smoking status: Never Smoker  . Smokeless tobacco: Never Used  Substance and Sexual Activity  . Alcohol use: Yes    Comment: occassionally  . Drug use: No  . Sexual activity: Not on file  Lifestyle  . Physical activity:    Days per week: Not on file    Minutes per session: Not on file  . Stress: Not on file  Relationships  . Social connections:    Talks on phone: Not on file    Gets together: Not on file    Attends religious service: Not on file    Active member of club or organization: Not on file    Attends meetings of clubs or organizations: Not on file    Relationship status: Not on file  . Intimate partner violence:    Fear of current or ex partner: Not on file    Emotionally abused: Not on file    Physically abused: Not on file    Forced sexual activity: Not on file  Other Topics Concern  . Not on file  Social History Narrative  . Not on file    Family History  Problem Relation Age of Onset  . Heart disease Mother 44       Stent placement  . Diabetes Mother   . Diabetes Sister   . Thyroid cancer Brother        2010  . Prostate  cancer Maternal Grandfather   . Emphysema Paternal Grandfather    The patient does not have a history of early familial atrial fibrillation or other arrhythmias.  ROS- All systems are reviewed and negative except as per the HPI above.  Physical Exam: Vitals:   01/22/18 1529  BP: 138/76  Pulse: (!) 55  Weight: 116.6 kg  Height: 5\' 7"  (1.702 m)    GEN- The patient is well appearing, alert and oriented  x 3 today.   Head- normocephalic, atraumatic Eyes-  Sclera clear, conjunctiva pink Ears- hearing intact Oropharynx- clear Neck- supple  Lungs- Clear to ausculation bilaterally, normal work of breathing Heart- Regular rate and rhythm  GI- soft, NT, ND, + BS Extremities- no clubbing, cyanosis, or edema MS- no significant deformity or atrophy Skin- no rash or lesion Psych- euthymic mood, full affect Neuro- strength and sensation are intact  Wt Readings from Last 3 Encounters:  01/22/18 116.6 kg  12/12/17 115.2 kg  12/04/17 115.2 kg    EKG today demonstrates sinus brady  Epic records are reviewed at length today  Assessment and Plan:  1. Persistent atrial fibrillation Doing well post cardioversion Continue to monitor with Kardia Continue Eliquis for CHADS2VASC of 2 We reviewed lifestyle modification at length today including regular exercise and weight loss, compliance with CPAP  2.  PVCs Present on last visit, not present today   Follow up with Dr. Gwenlyn Found per recall afib clinic as needed   Roderic Palau, NP 01/22/2018 3:50 PM

## 2018-02-14 ENCOUNTER — Encounter: Payer: Self-pay | Admitting: Neurology

## 2018-03-10 ENCOUNTER — Other Ambulatory Visit: Payer: Self-pay | Admitting: Pharmacist Clinician (PhC)/ Clinical Pharmacy Specialist

## 2018-03-10 MED ORDER — EVOLOCUMAB 140 MG/ML ~~LOC~~ SOAJ: 140.0000 mg | SUBCUTANEOUS | 12 refills | Status: DC

## 2018-03-26 ENCOUNTER — Encounter: Payer: Self-pay | Admitting: Neurology

## 2018-03-31 ENCOUNTER — Ambulatory Visit: Payer: BC Managed Care – PPO | Admitting: Neurology

## 2018-03-31 ENCOUNTER — Encounter: Payer: Self-pay | Admitting: Neurology

## 2018-03-31 VITALS — BP 128/94 | HR 49 | Ht 67.0 in | Wt 256.0 lb

## 2018-03-31 DIAGNOSIS — Z9989 Dependence on other enabling machines and devices: Secondary | ICD-10-CM | POA: Diagnosis not present

## 2018-03-31 DIAGNOSIS — G4733 Obstructive sleep apnea (adult) (pediatric): Secondary | ICD-10-CM | POA: Diagnosis not present

## 2018-03-31 NOTE — Progress Notes (Signed)
Subjective:    Patient ID: Bradley Holmes is a 58 y.o. male.  HPI     Interim history:   Bradley Holmes is a 58 year old right-handed gentleman with an underlying medical history of type 2 diabetes, diabetic retinopathy (followed by ophthalmology), heart murmur, reflux disease, hypertension, coronary artery disease (s/p DES in 2012), obesity, and recent Dx of A. fib (s/p cardioversion in 11/2017), who presents for follow-up consultation of his obstructive sleep apnea, after recent sleep study testing and starting CPAP therapy. The patient is unaccompanied today. I first met him on 12/04/2017 at the request of his primary care physician, at which time Bradley Holmes reported snoring and daytime somnolence, was recently diagnosed with A. fib, status post cardioversion. Bradley Holmes was advised to proceed with a sleep study. Bradley Holmes had a split-night sleep study on 12/27/2017. I went over his test results with him in detail today. Baseline sleep efficiency was reduced at 45.5% with a normal sleep latency but absence of REM sleep. Total AHI was 97.7 per hour, average oxygen saturation 93%, nadir was 84% during non-REM sleep. Bradley Holmes qualified for CPAP treatment. Bradley Holmes was fitted with a medium fullface mask. CPAP was titrated from 5 cm to 11 cm. On the final pressure his AHI was 0 per hour with nonsupine and non-REM sleep achieved only, O2 nadir of 92%. Bradley Holmes had 16% of REM sleep and 31.5% of slow-wave sleep during the titration portion of the study. Based on his test results I suggested a home treatment pressure of 12 cm.  Today, 03/31/2018: I reviewed his CPAP compliance data from the last 30 days from 02/25/2018 through 03/26/2018, during which time Bradley Holmes used his machine 29 days. Percent used days greater than 4 hours was slightly suboptimal at 67% with an average usage of 4 hours and 22 minutes, residual AHI at goal at 1 per hour, leak on the higher end with the 95th percentile at 20.3 L/m on a pressure of 12 cm. His compliance was much better  during the onset of early November through early December with percent used days greater than 4 hours at 90% at the time, average usage of 5 hours and 1 minute. Overall, since set up in November, his average usage has been 4 hours and 45 minutes and percent used days greater than 4 hours adequate at 77% altogether. Bradley Holmes reports doing well with CPAP. His daytime tiredness is much better, his nocturia is much better. Bradley Holmes is using a nasal mask. Bradley Holmes was not able to use his machine recently because of a cold. Other than that, Bradley Holmes is compliant with treatment. Bradley Holmes did have some recent stressors, sadly, Bradley Holmes lost his father about 2 weeks ago. Bradley Holmes traveled to [place].   The patient's allergies, current medications, family history, past medical history, past social history, past surgical history and problem list were reviewed and updated as appropriate.   Previously:  12/04/2017: (Bradley Holmes) reports snoring and excessive daytime somnolence. I reviewed your office note from 10/11/2017, which you kindly included.  Bradley Holmes is single, has no biological children, is an Forensic scientist host regularly. Works at [employer], [employer] of [employer]. Bradley Holmes is a nonsmoker and drinks alcohol rarely, caffeine regularly in the form of diet soda. 2-4 bottles per day on average. Bradley Holmes does not typically drink coffee. Bradley Holmes is aware that Bradley Holmes was advised to reduce his caffeine intake upon discharge from his cardioversion appointment. Bradley Holmes is not aware of any family history of OSA, denies morning headaches or telltale restless leg symptoms. Bedtime is around  11 or midnight, rise time at 6:30. Bradley Holmes had a tonsillectomy as a child. Bradley Holmes reports nocturia about 2-3 times per average night. Bradley Holmes has woken up with a sense of gasping for air. Bradley Holmes typically sleeps on his sides. Bradley Holmes has had nighttime reflux symptoms and takes Prilosec daily. His weight has been fairly stable. His Epworth sleepiness score is 12 out of 24 today, fatigue score is 38 out of 63. Bradley Holmes had lost weight in the past with  Nutrisystem but did not keep his diet going.  His Past Medical History Is Significant For: Past Medical History:  Diagnosis Date  . Coronary artery disease    LEXISCAN, 01/05/2011 - defect seen in Basal Inferior, Mid Inferior, and Apical Lateral region(s)-consistent with infarct/scar, global LV systolic function mildly reduced, abnormal study  . GERD (gastroesophageal reflux disease)   . Hyperlipidemia   . Hypertension    2D ECHO, 10/10/2010 - EF >55%, normal  . Insulin dependent diabetes mellitus ([place])   . Pseudoaneurysm ([place])    LEA DOPPLER, 11/01/2010 - right groin hematoma measuring 2.47x0.62x1.61 centimeters in greatest diameter    His Past Surgical History Is Significant For: Past Surgical History:  Procedure Laterality Date  . CARDIAC CATHETERIZATION  10/24/2010   CTI - 3030 Resolute drug-eluting stent, 3.69m resulting reduction og a long CT to 0% residual with excellent flow.  .Marland KitchenCARDIOVERSION N/A 12/02/2017   Procedure: CARDIOVERSION;  Surgeon: MLarey Dresser MD;  [facility];  Service: Cardiovascular;  Laterality: N/A;    His Family History Is Significant For: Family History  Problem Relation Age of Onset  . Heart disease Mother 646      Stent placement  . Diabetes Mother   . Diabetes Sister   . Thyroid cancer Brother        2010  . Prostate cancer Maternal Grandfather   . Emphysema Paternal Grandfather     His Social History Is Significant For: Social History   Socioeconomic History  . Marital status: Single    Spouse name: Not on file  . Number of children: Not on file  . Years of education: Not on file  . Highest education level: Not on file  Occupational History  . Not on file  Social Needs  . Financial resource strain: Not on file  . Food insecurity:    Worry: Not on file    Inability: Not on file  . Transportation needs:    Medical: Not on file    Non-medical: Not on file  Tobacco Use  . Smoking status: Never Smoker  . Smokeless  tobacco: Never Used  Substance and Sexual Activity  . Alcohol use: Yes    Comment: occassionally  . Drug use: No  . Sexual activity: Not on file  Lifestyle  . Physical activity:    Days per week: Not on file    Minutes per session: Not on file  . Stress: Not on file  Relationships  . Social connections:    Talks on phone: Not on file    Gets together: Not on file    Attends religious service: Not on file    Active member of club or organization: Not on file    Attends meetings of clubs or organizations: Not on file    Relationship status: Not on file  Other Topics Concern  . Not on file  Social History Narrative  . Not on file    His Allergies Are:  Allergies  Allergen Reactions  . Norvasc [  Amlodipine Besylate] Other (See Comments)    Edema  . Sulfa Antibiotics Hives  :   His Current Medications Are:  Outpatient Encounter Medications as of 03/31/2018  Medication Sig  . apixaban (ELIQUIS) 5 MG TABS tablet Take 5 mg by mouth 2 (two) times daily.  Marland Kitchen aspirin EC 81 MG tablet Take 1 tablet (81 mg total) by mouth daily. (Patient taking differently: Take 81 mg by mouth every evening. )  . BD PEN NEEDLE NANO U/F 32G X 4 MM MISC   . BYSTOLIC 10 MG tablet TAKE 1 TABLET EVERY EVENING (Patient taking differently: Take 10 mg by mouth every evening. )  . Coenzyme Q10 (CO Q-10) 200 MG CAPS Take 200 mg by mouth daily.   . dapagliflozin propanediol (FARXIGA) 10 MG TABS tablet Take 10 mg by mouth daily.  . Evolocumab (REPATHA SURECLICK) 409 MG/ML SOAJ Inject 140 mg into the skin every 14 (fourteen) days.  . Insulin Aspart (NOVOLOG FLEXPEN [place]) Inject 20 Units into the skin 3 (three) times daily with meals.   . nitroGLYCERIN (NITROSTAT) 0.4 MG SL tablet PLACE 1 TABLET (0.4 MG TOTAL) UNDER THE TONGUE EVERY 5 (FIVE) MINUTES AS NEEDED FOR CHEST PAIN.  Marland Kitchen omeprazole (PRILOSEC) 20 MG capsule Take 20 mg by mouth daily.  . ONE TOUCH ULTRA TEST test strip   . ONETOUCH DELICA LANCETS 81X MISC Apply 1  strip topically 3 (three) times daily.  . Potassium Chloride ER 20 MEQ TBCR Take 20 mEq by mouth 3 (three) times daily.   . TRESIBA FLEXTOUCH 200 UNIT/ML SOPN Inject 90 Units as directed daily.   Marland Kitchen triamcinolone cream (KENALOG) 0.1 % Apply 1 application topically 2 (two) times daily.  [name] 40-10-25 MG TABS Take 1 tablet by mouth daily.   . [DISCONTINUED] mupirocin ointment (BACTROBAN) 2 % Place 1 application into the nose 2 (two) times daily.   No facility-administered encounter medications on file as of 03/31/2018.   :  Review of Systems:  Out of a complete 14 point review of systems, all are reviewed and negative with the exception of these symptoms as listed below:  Review of Systems  Neurological:       Pt presents today to discuss his cpap. Pt reports that his cpap is going well and Bradley Holmes feels less tired during the day.  Epworth Sleepiness Scale 0= would never doze 1= slight chance of dozing 2= moderate chance of dozing 3= high chance of dozing  Sitting and reading: 1 Watching TV: 1 Sitting inactive in a public place (ex. Theater or meeting): 1 As a passenger in a car for an hour without a break: 2 Lying down to rest in the afternoon: 2 Sitting and talking to someone: 0 Sitting quietly after lunch (no alcohol): 1 In a car, while stopped in traffic: 0 Total: 8     Objective:  Neurological Exam  Physical Exam Physical Examination:   Vitals:   03/31/18 1256  BP: (!) 191/90  Pulse: (!) 49   Rechecked blood pressure was 124/94.Marland Kitchen   General Examination: The patient is a very pleasant 58 y.o. male in no acute distress. Bradley Holmes appears well-developed and well groomed.   HEENT: Normocephalic, atraumatic, pupils are equal, round and reactive to light and accommodation. Corrective eyeglasses in place. Extraocular tracking is good without limitation to gaze excursion or nystagmus noted. Normal smooth pursuit is noted. Hearing is grossly intact. Face is symmetric with normal  facial animation and normal facial sensation. Speech is clear with no  dysarthria noted. There is no hypophonia. There is no lip, neck/head, jaw or voice tremor. Neck shows FROM. Oropharynx exam reveals: mild mouth dryness, adequate dental hygiene and moderate airway crowding. Tongue protrudes centrally and palate elevates symmetrically. Tonsils are absent.   Chest: Clear to auscultation without wheezing, rhonchi or crackles noted.  Heart: S1+S2+0, regular with systolic murmur.   Abdomen: Soft, non-tender and non-distended.  Extremities: There is 1+ pitting edema around the ankles bilaterally, right more noticeable than left.  Skin: Warm and dry without trophic changes noted.  Musculoskeletal: exam reveals no obvious joint deformities, tenderness or joint swelling or erythema.   Neurologically:  Mental status: The patient is awake, alert and oriented in all 4 spheres. His immediate and remote memory, attention, language skills and fund of knowledge are appropriate. There is no evidence of aphasia, agnosia, apraxia or anomia. Speech is clear with normal prosody and enunciation. Thought process is linear. Mood is normal and affect is normal.  Cranial nerves II - XII are as described above under HEENT exam.  Motor exam: Normal bulk, strength and tone is noted. There is no drift, tremor or rebound. Fine motor skills and coordination: grossly intact.  Cerebellar testing: No dysmetria or intention tremor on finger to nose testing. Heel to shin is unremarkable bilaterally. There is no truncal or gait ataxia.  Sensory exam: intact to light touch in the upper and lower extremities.  Gait, station and balance: Bradley Holmes stands easily. No veering to one side is noted. No leaning to one side is noted. Posture is age-appropriate and stance is narrow based. Gait shows normal stride length and normal pace. No problems turning are noted.     Assessment and Plan:   In summary, Bradley Holmes is a very  pleasant 58 year old male with an underlying medical history of type 2 diabetes with evidence of retinopathy, followed by ophthalmology, heart murmur, reflux disease, hypertension, coronary artery disease with status post drug-eluting stent in 2012, obesity, recent diagnosis of A. fib with recent status post cardioversion on 12/02/17, who presents for follow-up consultation of his obstructive sleep apnea. His split-night sleep study from 12/27/2017 indicated severe sleep apnea with an AHI of 97.7 per hour, O2 nadir was 84% during non-REM sleep. Bradley Holmes has established treatment on CPAP of 12 cm. Bradley Holmes is compliant with treatment with the exception of a recent cold during which time Bradley Holmes could not use his CPAP consistently. Bradley Holmes has benefited from treatment including better sleep consolidation, less nocturia, but her daytime energy, less daytime somnolence. Bradley Holmes is commended for his treatment adherence. Bradley Holmes is using a nasal mask with success. Bradley Holmes is advised to routinely follow-up in 6 months with one of our nurse practitioners, we can hopefully see him yearly after that. Bradley Holmes is planning to reschedule his cardiology appointment as Bradley Holmes may have missed one recently.  We talked about his sleep study results in detail today and reviewed his compliance data together. Bradley Holmes is advised to be fully compliant with CPAP therapy and work on weight loss. Encouraged to try to sleep 7-8 hours each night. I answered all his questions today and Bradley Holmes was in agreement. I spent 25 minutes in total face-to-face time with the patient, more than 50% of which was spent in counseling and coordination of care, reviewing test results, reviewing medication and discussing or reviewing the diagnosis of OSA, its prognosis and treatment options. Pertinent laboratory and imaging test results that were available during this visit with the patient were reviewed by me  and considered in my medical decision making (see chart for details).

## 2018-03-31 NOTE — Patient Instructions (Addendum)
Please continue using your CPAP regularly. While your insurance requires that you use CPAP at least 4 hours each night on 70% of the nights, I recommend, that you not skip any nights and use it throughout the night if you can. Getting used to CPAP and staying with the treatment long term does take time and patience and discipline. Untreated obstructive sleep apnea when it is moderate to severe can have an adverse impact on cardiovascular health and raise her risk for heart disease, arrhythmias, hypertension, congestive heart failure, stroke and diabetes. Untreated obstructive sleep apnea causes sleep disruption, nonrestorative sleep, and sleep deprivation. This can have an impact on your day to day functioning and cause daytime sleepiness and impairment of cognitive function, memory loss, mood disturbance, and problems focussing. Using CPAP regularly can improve these symptoms.  Keep up the good work! We will see you back in 6 months for sleep apnea check up, and if you continue to do well on CPAP I will see you once a year thereafter. Please see Ward Givens, Nurse Practitioner next time.

## 2018-05-14 ENCOUNTER — Other Ambulatory Visit: Payer: Self-pay | Admitting: Cardiovascular Disease

## 2018-07-03 ENCOUNTER — Telehealth: Payer: Self-pay | Admitting: *Deleted

## 2018-07-03 NOTE — Telephone Encounter (Signed)
Left message for patient to call and schedule 6 mos recall (virtual/telephone)

## 2018-09-17 NOTE — Progress Notes (Signed)
Primary Care Physician: [name], MD Primary Cardiologist: Gwenlyn Found Referring Physician: Jayshon Dommer is a 58 y.o. male with a history of persistent atrial fibrillation who presents for f/u in the [facility].  The patient was initially diagnosed with atrial fibrillation 2019 after presenting with symptoms of fatigue.  He was started on Eliquis and had successful cardioversion 12/02/17.  He now has a Chad. He had a positive sleep study and is now on cpap.  Patient reports that he has had more fatigue and SOB recently. He also notes irregular heart rate on his BP machine. The Kardia strips he sent from home show bigeminal PVCs. ECG today shows the same. He has had symptoms of fatigue for about one week. Of note, he had frequent PVCs noted incidentally in an office visit last year but these had resolved on follow up. Patient does admit to significant caffeine use.   Today, he denies symptoms of chest pain, shortness of breath, orthopnea, PND, lower extremity edema, dizziness, presyncope, syncope, snoring, daytime somnolence, bleeding, or neurologic sequela. The patient is tolerating medications without difficulties and is otherwise without complaint today.    Atrial Fibrillation Risk Factors:  he does have symptoms or diagnosis of sleep apnea. Positive sleep study and is on cpap  he does not have a history of rheumatic fever. he does not have a history of alcohol use.  he has a BMI of Body mass index is 39.63 kg/m.Marland Kitchen Filed Weights   09/18/18 0856  Weight: 114.8 kg    LA size: 42   Atrial Fibrillation Management history:  Previous antiarrhythmic drugs: none Previous cardioversions: 11/2017 Previous ablations: none CHADS2VASC score: 3 Anticoagulation history: Eliquis   Past Medical History:  Diagnosis Date  . Coronary artery disease    LEXISCAN, 01/05/2011 - defect seen in Basal Inferior, Mid Inferior, and Apical Lateral  region(s)-consistent with infarct/scar, global LV systolic function mildly reduced, abnormal study  . GERD (gastroesophageal reflux disease)   . Hyperlipidemia   . Hypertension    2D ECHO, 10/10/2010 - EF >55%, normal  . Insulin dependent diabetes mellitus ([place])   . Pseudoaneurysm ([place])    LEA DOPPLER, 11/01/2010 - right groin hematoma measuring 2.47x0.62x1.61 centimeters in greatest diameter   Past Surgical History:  Procedure Laterality Date  . CARDIAC CATHETERIZATION  10/24/2010   CTI - 3030 Resolute drug-eluting stent, 3.42mm resulting reduction og a long CT to 0% residual with excellent flow.  Marland Kitchen CARDIOVERSION N/A 12/02/2017   Procedure: CARDIOVERSION;  Surgeon: Larey Dresser, MD;  Location: [facility] ENDOSCOPY;  Service: Cardiovascular;  Laterality: N/A;    Current Outpatient Medications  Medication Sig Dispense Refill  . apixaban (ELIQUIS) 5 MG TABS tablet Take 5 mg by mouth 2 (two) times daily.    Marland Kitchen aspirin EC 81 MG tablet Take 1 tablet (81 mg total) by mouth daily. (Patient taking differently: Take 81 mg by mouth every evening. ) 90 tablet 3  . BD PEN NEEDLE NANO U/F 32G X 4 MM MISC     . BYSTOLIC 10 MG tablet TAKE 1 TABLET EVERY EVENING (Patient taking differently: Take 10 mg by mouth every evening. ) 30 tablet 2  . Coenzyme Q10 (CO Q-10) 200 MG CAPS Take 200 mg by mouth daily.     . dapagliflozin propanediol (FARXIGA) 10 MG TABS tablet Take 10 mg by mouth daily.    . Evolocumab (REPATHA SURECLICK) 244 MG/ML SOAJ Inject 140 mg into the skin every 14 (  fourteen) days. 2 pen 12  . hydrocortisone cream ([employer] CORTISONE MAXIMUM STRENGTH) 1 % Apply 1 application topically as needed for itching.    . Insulin Aspart (NOVOLOG FLEXPEN [place]) Inject 20 Units into the skin 3 (three) times daily with meals.     Marland Kitchen omeprazole (PRILOSEC) 20 MG capsule Take 20 mg by mouth daily.    . ONE TOUCH ULTRA TEST test strip     . ONETOUCH DELICA LANCETS 25K MISC Apply 1 strip topically 3 (three) times daily.  8  .  Potassium Chloride ER 20 MEQ TBCR Take 20 mEq by mouth 3 (three) times daily.     . TRESIBA FLEXTOUCH 200 UNIT/ML SOPN Inject 92 Units as directed daily.   6  . triamcinolone cream (KENALOG) 0.1 % Apply 1 application topically 2 (two) times daily.    [name] 40-10-25 MG TABS Take 1 tablet by mouth daily.     . nitroGLYCERIN (NITROSTAT) 0.4 MG SL tablet PLACE 1 TABLET (0.4 MG TOTAL) UNDER THE TONGUE EVERY 5 (FIVE) MINUTES AS NEEDED FOR CHEST PAIN. (Patient not taking: Reported on 09/18/2018) 25 tablet 3   No current facility-administered medications for this encounter.     Allergies  Allergen Reactions  . Norvasc [Amlodipine Besylate] Other (See Comments)    Edema  . Sulfa Antibiotics Hives    Social History   Socioeconomic History  . Marital status: Single    Spouse name: Not on file  . Number of children: Not on file  . Years of education: Not on file  . Highest education level: Not on file  Occupational History  . Not on file  Social Needs  . Financial resource strain: Not on file  . Food insecurity    Worry: Not on file    Inability: Not on file  . Transportation needs    Medical: Not on file    Non-medical: Not on file  Tobacco Use  . Smoking status: Never Smoker  . Smokeless tobacco: Never Used  Substance and Sexual Activity  . Alcohol use: Yes    Comment: occassionally  . Drug use: No  . Sexual activity: Not on file  Lifestyle  . Physical activity    Days per week: Not on file    Minutes per session: Not on file  . Stress: Not on file  Relationships  . Social Herbalist on phone: Not on file    Gets together: Not on file    Attends religious service: Not on file    Active member of club or organization: Not on file    Attends meetings of clubs or organizations: Not on file    Relationship status: Not on file  . Intimate partner violence    Fear of current or ex partner: Not on file    Emotionally abused: Not on file    Physically abused: Not  on file    Forced sexual activity: Not on file  Other Topics Concern  . Not on file  Social History Narrative  . Not on file    Family History  Problem Relation Age of Onset  . Heart disease Mother 53       Stent placement  . Diabetes Mother   . Diabetes Sister   . Thyroid cancer Brother        2010  . Prostate cancer Maternal Grandfather   . Emphysema Paternal Grandfather    The patient does not have a history of early familial atrial  fibrillation or other arrhythmias.  ROS- All systems are reviewed and negative except as per the HPI above.  Physical Exam: Vitals:   09/18/18 0856  BP: (!) 148/68  Pulse: 62  Weight: 114.8 kg  Height: 5\' 7"  (1.702 m)    GEN- The patient is well appearing obese male, alert and oriented x 3 today.   HEENT-head normocephalic, atraumatic, sclera clear, conjunctiva pink, hearing intact, trachea midline. Lungs- Clear to ausculation bilaterally, normal work of breathing Heart- Regularly irregular rate and rhythm, no murmurs, rubs or gallops  GI- soft, NT, ND, + BS Extremities- no clubbing, cyanosis, or edema MS- no significant deformity or atrophy Skin- no rash or lesion Psych- euthymic mood, full affect Neuro- strength and sensation are intact   Wt Readings from Last 3 Encounters:  09/18/18 114.8 kg  03/31/18 116.1 kg  01/22/18 116.6 kg    EKG today demonstrates sinus rhythm HR 62, bigeminy, LAFB, PR 188, QRS 110, QTc 438  Echo 10/21/17 - Left ventricle: The cavity size was normal. Wall thickness was   normal. The estimated ejection fraction was 55%. Although no   diagnostic regional wall motion abnormality was identified, this   possibility cannot be completely excluded on the basis of this   study. The study was not technically sufficient to allow   evaluation of LV diastolic dysfunction due to atrial   fibrillation. - Aortic valve: There was no stenosis. - Mitral valve: There was no significant regurgitation. - Left atrium:  The atrium was mildly dilated. - Right ventricle: The cavity size was mildly dilated. Systolic   function was normal. - Pulmonary arteries: No complete TR doppler jet so unable to   estimate PA systolic pressure. - Inferior vena cava: The vessel was normal in size. The   respirophasic diameter changes were in the normal range (>= 50%),   consistent with normal central venous pressure.  Impressions:  - The patient was in atrial fibrillation. Technically difficult   study with poor acoustic windows. Normal LV size with EF 55%.   Mildly dilated RV with normal systolic function. No significant   valvular abnormalities.   Epic records are reviewed at length today  Assessment and Plan:  1. Persistent atrial fibrillation Appears to be maintaining SR. Continue to monitor with Kardia Continue Eliquis 5 mg BID Continue bystolic 10 mg daily. Lifestyle changes as below.  This patients CHA2DS2-VASc Score and unadjusted Ischemic Stroke Rate (% per year) is equal to 3.2 % stroke rate/year from a score of 3  Above score calculated as 1 point each if present [CHF, HTN, DM, Vascular=MI/PAD/Aortic Plaque, Age if 65-74, or Male] Above score calculated as 2 points each if present [Age > 75, or Stroke/TIA/TE]  2. OSA Patient reports compliance with CPAP therapy.  3. HTN Borderline today. Patient has BP machine at home which shows both normal and high readings. Could increase bystolic but he has resting bradycardia in the past.  Monitor for now.   4. CAD No anginal symptoms. Continue present therapy and risk factor modification.   5. 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 Noted incidentally last year as well. EF 55% on echo 10/2017. Will order Zio patch to evaluate burden. Encouraged him to cut back on caffeine use.  Refer to EP.  6. Obesity Body mass index is 39.63 kg/m. Lifestyle modification was discussed and encouraged including regular physical activity and weight reduction.    Refer to EP for evaluation of frequent PVCs.   Adline Peals PA-C [facility]  [facility] [address] [place], [place] [address] [phone]

## 2018-09-18 ENCOUNTER — Ambulatory Visit (HOSPITAL_COMMUNITY)
Admission: RE | Admit: 2018-09-18 | Discharge: 2018-09-18 | Disposition: A | Discharge status: Home / Self Care | Payer: BC Managed Care – PPO | Source: Ambulatory Visit | Attending: Physician Assistant | Admitting: Physician Assistant

## 2018-09-18 ENCOUNTER — Other Ambulatory Visit: Payer: Self-pay

## 2018-09-18 ENCOUNTER — Encounter (HOSPITAL_COMMUNITY): Payer: Self-pay | Admitting: Physician Assistant

## 2018-09-18 VITALS — BP 148/68 | HR 62 | Ht 67.0 in | Wt 253.0 lb

## 2018-09-18 DIAGNOSIS — Z808 Family history of malignant neoplasm of other organs or systems: Secondary | ICD-10-CM | POA: Insufficient documentation

## 2018-09-18 DIAGNOSIS — E669 Obesity, unspecified: Secondary | ICD-10-CM | POA: Insufficient documentation

## 2018-09-18 DIAGNOSIS — Z6839 Body mass index (BMI) 39.0-39.9, adult: Secondary | ICD-10-CM | POA: Insufficient documentation

## 2018-09-18 DIAGNOSIS — I4819 Other persistent atrial fibrillation: Secondary | ICD-10-CM | POA: Insufficient documentation

## 2018-09-18 DIAGNOSIS — K219 Gastro-esophageal reflux disease without esophagitis: Secondary | ICD-10-CM | POA: Diagnosis not present

## 2018-09-18 DIAGNOSIS — Z8249 Family history of ischemic heart disease and other diseases of the circulatory system: Secondary | ICD-10-CM | POA: Insufficient documentation

## 2018-09-18 DIAGNOSIS — Z833 Family history of diabetes mellitus: Secondary | ICD-10-CM | POA: Insufficient documentation

## 2018-09-18 DIAGNOSIS — I493 Ventricular premature depolarization: Secondary | ICD-10-CM | POA: Diagnosis not present

## 2018-09-18 DIAGNOSIS — Z79899 Other long term (current) drug therapy: Secondary | ICD-10-CM | POA: Insufficient documentation

## 2018-09-18 DIAGNOSIS — I10 Essential (primary) hypertension: Secondary | ICD-10-CM | POA: Diagnosis not present

## 2018-09-18 DIAGNOSIS — G4733 Obstructive sleep apnea (adult) (pediatric): Secondary | ICD-10-CM | POA: Diagnosis not present

## 2018-09-18 DIAGNOSIS — E119 Type 2 diabetes mellitus without complications: Secondary | ICD-10-CM | POA: Diagnosis not present

## 2018-09-18 DIAGNOSIS — I2510 Atherosclerotic heart disease of native coronary artery without angina pectoris: Secondary | ICD-10-CM | POA: Diagnosis not present

## 2018-09-18 DIAGNOSIS — Z794 Long term (current) use of insulin: Secondary | ICD-10-CM | POA: Insufficient documentation

## 2018-09-18 DIAGNOSIS — Z7901 Long term (current) use of anticoagulants: Secondary | ICD-10-CM | POA: Diagnosis not present

## 2018-09-18 DIAGNOSIS — Z888 Allergy status to other drugs, medicaments and biological substances status: Secondary | ICD-10-CM | POA: Insufficient documentation

## 2018-09-18 DIAGNOSIS — Z882 Allergy status to sulfonamides status: Secondary | ICD-10-CM | POA: Diagnosis not present

## 2018-09-18 DIAGNOSIS — E785 Hyperlipidemia, unspecified: Secondary | ICD-10-CM | POA: Diagnosis not present

## 2018-09-20 ENCOUNTER — Other Ambulatory Visit: Payer: Self-pay | Admitting: Cardiovascular Disease

## 2018-09-23 ENCOUNTER — Other Ambulatory Visit: Payer: Self-pay | Admitting: Pharmacist Clinician (PhC)/ Clinical Pharmacy Specialist

## 2018-09-23 MED ORDER — PRALUENT 150 MG/ML ~~LOC~~ SOAJ: 150.0000 mg | SUBCUTANEOUS | 12 refills | Status: DC

## 2018-09-24 ENCOUNTER — Encounter: Payer: Self-pay | Admitting: Adult Health

## 2018-09-29 ENCOUNTER — Encounter: Payer: Self-pay | Admitting: Neurology

## 2018-09-29 ENCOUNTER — Other Ambulatory Visit: Payer: Self-pay

## 2018-09-29 ENCOUNTER — Ambulatory Visit: Payer: BC Managed Care – PPO | Admitting: Neurology

## 2018-09-29 VITALS — BP 160/80 | HR 66 | Ht 66.0 in | Wt 254.0 lb

## 2018-09-29 DIAGNOSIS — R001 Bradycardia, unspecified: Secondary | ICD-10-CM | POA: Diagnosis not present

## 2018-09-29 DIAGNOSIS — I480 Paroxysmal atrial fibrillation: Secondary | ICD-10-CM

## 2018-09-29 DIAGNOSIS — G4733 Obstructive sleep apnea (adult) (pediatric): Secondary | ICD-10-CM

## 2018-09-29 DIAGNOSIS — Z9989 Dependence on other enabling machines and devices: Secondary | ICD-10-CM

## 2018-09-29 NOTE — Progress Notes (Signed)
Subjective:    Patient ID: Bradley Holmes is a 58 y.o. male.  HPI     Interim history:  Bradley Holmes is a 58 year old right-handed gentleman with an underlying medical history of type 2 diabetes, diabetic retinopathy (followed by ophthalmology), heart murmur, reflux disease, hypertension, coronary artery disease (s/p DES in 2012), obesity, and recent Dx of A. fib (s/p cardioversion in 11/2017), who presents for follow-up consultation of his obstructive sleep apnea, on CPAP therapy.  The patient is unaccompanied today.  I last saw him on 03/31/2018, at which time he reported doing well with CPAP. His daytime tiredness was much better, his nocturia as well. He had a decrease in treatment d/t a recent cold.  Sadly, he had recently lost his father.    Today, 09/29/2018: I reviewed his CPAP compliance data from 08/26/2018 through 09/24/2018 which is a total of 30 days, during which time he used his CPAP every night with percent used days greater than 4 hours at 97%, indicating excellent compliance with an average usage of 6 hours and 15 minutes, residual AHI at goal at 1.1/h, leak on the higher side with a 95th percentile at 28.5 L/min on a pressure of 12 cm.    He reports Being compliant with his CPAP, he has no new issues with this, uses a nasal mask.  He has had some intermittent shortness of breath, had a cardiology checkup earlier this month, was noted to have PVCs and was referred to EP, has an appointment with Dr. Crissie Sickles pending next week.  He also did a 3-day heart monitor which he mailed back About a week ago, he has not heard about the results.He denies any chest pain, he currently denies any shortness of breath, feels a little anxious about the irregularities in his heart rate. He Has a blood pressure monitor and a pulse ox at home and had low readings at home, but When he counted his pulse it was higher.  He noticed some irregularities in his own pulse rate. Of note, since his last cardiology  appointment with the PA he has completely discontinued his soda intake.  He would drink up to 2 L of soda per day and has abruptly stopped it, he tries to hydrate well with water.  The patient's allergies, current medications, family history, past medical history, past social history, past surgical history and problem list were reviewed and updated as appropriate.   Previously:  I first met him on 12/04/2017 at the request of his primary care physician, at which time he reported snoring and daytime somnolence, was recently diagnosed with A. fib, status post cardioversion. He was advised to proceed with a sleep study. He had a split-night sleep study on 12/27/2017. I went over his test results with him in detail today. Baseline sleep efficiency was reduced at 45.5% with a normal sleep latency but absence of REM sleep. Total AHI was 97.7 per hour, average oxygen saturation 93%, nadir was 84% during non-REM sleep. He qualified for CPAP treatment. He was fitted with a medium fullface mask. CPAP was titrated from 5 cm to 11 cm. On the final pressure his AHI was 0 per hour with nonsupine and non-REM sleep achieved only, O2 nadir of 92%. He had 16% of REM sleep and 31.5% of slow-wave sleep during the titration portion of the study. Based on his test results I suggested a home treatment pressure of 12 cm.  I reviewed his CPAP compliance data from the last 30 days from  02/25/2018 through 03/26/2018, during which time he used his machine 29 days. Percent used days greater than 4 hours was slightly suboptimal at 67% with an average usage of 4 hours and 22 minutes, residual AHI at goal at 1 per hour, leak on the higher end with the 95th percentile at 20.3 L/m on a pressure of 12 cm. His compliance was much better during the onset of early November through early December with percent used days greater than 4 hours at 90% at the time, average usage of 5 hours and 1 minute. Overall, since set up in November, his average  usage has been 4 hours and 45 minutes and percent used days greater than 4 hours adequate at 77% altogether.    12/04/2017: (He) reports snoring and excessive daytime somnolence. I reviewed your office note from 10/11/2017, which you kindly included.  He is single, has no biological children, is an Forensic scientist host regularly. Works at [employer], [employer] of [employer]. He is a nonsmoker and drinks alcohol rarely, caffeine regularly in the form of diet soda. 2-4 bottles per day on average. He does not typically drink coffee. He is aware that he was advised to reduce his caffeine intake upon discharge from his cardioversion appointment. He is not aware of any family history of OSA, denies morning headaches or telltale restless leg symptoms. Bedtime is around 11 or midnight, rise time at 6:30. He had a tonsillectomy as a child. He reports nocturia about 2-3 times per average night. He has woken up with a sense of gasping for air. He typically sleeps on his sides. He has had nighttime reflux symptoms and takes Prilosec daily. His weight has been fairly stable. His Epworth sleepiness score is 12 out of 24 today, fatigue score is 38 out of 63. He had lost weight in the past with [employer]ystem but did not keep his diet going.  His Past Medical History Is Significant For: Past Medical History:  Diagnosis Date  . Coronary artery disease    LEXISCAN, 01/05/2011 - defect seen in Basal Inferior, Mid Inferior, and Apical Lateral region(s)-consistent with infarct/scar, global LV systolic function mildly reduced, abnormal study  . GERD (gastroesophageal reflux disease)   . Hyperlipidemia   . Hypertension    2D ECHO, 10/10/2010 - EF >55%, normal  . Insulin dependent diabetes mellitus ([place])   . Pseudoaneurysm ([place])    LEA DOPPLER, 11/01/2010 - right groin hematoma measuring 2.47x0.62x1.61 centimeters in greatest diameter    His Past Surgical History Is Significant For: Past Surgical History:  Procedure Laterality  Date  . CARDIAC CATHETERIZATION  10/24/2010   CTI - 3030 Resolute drug-eluting stent, 3.44m resulting reduction og a long CT to 0% residual with excellent flow.  .Marland KitchenCARDIOVERSION N/A 12/02/2017   Procedure: CARDIOVERSION;  Surgeon: MLarey Dresser MD;  [facility];  Service: Cardiovascular;  Laterality: N/A;    His Family History Is Significant For: Family History  Problem Relation Age of Onset  . Heart disease Mother 681      Stent placement  . Diabetes Mother   . Diabetes Sister   . Thyroid cancer Brother        2010  . Prostate cancer Maternal Grandfather   . Emphysema Paternal Grandfather     His Social History Is Significant For: Social History   Socioeconomic History  . Marital status: Single    Spouse name: Not on file  . Number of children: Not on file  . Years of education: Not  on file  . Highest education level: Not on file  Occupational History  . Not on file  Social Needs  . Financial resource strain: Not on file  . Food insecurity    Worry: Not on file    Inability: Not on file  . Transportation needs    Medical: Not on file    Non-medical: Not on file  Tobacco Use  . Smoking status: Never Smoker  . Smokeless tobacco: Never Used  Substance and Sexual Activity  . Alcohol use: Yes    Comment: occassionally  . Drug use: No  . Sexual activity: Not on file  Lifestyle  . Physical activity    Days per week: Not on file    Minutes per session: Not on file  . Stress: Not on file  Relationships  . Social Herbalist on phone: Not on file    Gets together: Not on file    Attends religious service: Not on file    Active member of club or organization: Not on file    Attends meetings of clubs or organizations: Not on file    Relationship status: Not on file  Other Topics Concern  . Not on file  Social History Narrative  . Not on file    His Allergies Are:  Allergies  Allergen Reactions  . Norvasc [Amlodipine Besylate] Other (See  Comments)    Edema  . Sulfa Antibiotics Hives  :   His Current Medications Are:  Outpatient Encounter Medications as of 09/29/2018  Medication Sig  . Alirocumab (PRALUENT) 150 MG/ML SOAJ Inject 150 mg into the skin every 14 (fourteen) days.  Marland Kitchen apixaban (ELIQUIS) 5 MG TABS tablet Take 5 mg by mouth 2 (two) times daily.  Marland Kitchen aspirin EC 81 MG tablet Take 1 tablet (81 mg total) by mouth daily. (Patient taking differently: Take 81 mg by mouth every evening. )  . BD PEN NEEDLE NANO U/F 32G X 4 MM MISC   . BYSTOLIC 10 MG tablet TAKE 1 TABLET EVERY EVENING (Patient taking differently: Take 10 mg by mouth every evening. )  . Coenzyme Q10 (CO Q-10) 200 MG CAPS Take 200 mg by mouth daily.   . dapagliflozin propanediol (FARXIGA) 10 MG TABS tablet Take 10 mg by mouth daily.  . hydrocortisone cream ([employer] CORTISONE MAXIMUM STRENGTH) 1 % Apply 1 application topically as needed for itching.  . Insulin Aspart (NOVOLOG FLEXPEN [place]) Inject 20 Units into the skin 3 (three) times daily with meals.   . nitroGLYCERIN (NITROSTAT) 0.4 MG SL tablet PLACE 1 TABLET (0.4 MG TOTAL) UNDER THE TONGUE EVERY 5 (FIVE) MINUTES AS NEEDED FOR CHEST PAIN.  Marland Kitchen omeprazole (PRILOSEC) 20 MG capsule Take 20 mg by mouth daily.  . ONE TOUCH ULTRA TEST test strip   . ONETOUCH DELICA LANCETS 47S MISC Apply 1 strip topically 3 (three) times daily.  . Potassium Chloride ER 20 MEQ TBCR Take 20 mEq by mouth 3 (three) times daily.   . TRESIBA FLEXTOUCH 200 UNIT/ML SOPN Inject 92 Units as directed daily.   Marland Kitchen triamcinolone cream (KENALOG) 0.1 % Apply 1 application topically 2 (two) times daily.  [name] 40-10-25 MG TABS Take 1 tablet by mouth daily.   . [DISCONTINUED] Evolocumab (REPATHA SURECLICK) 962 MG/ML SOAJ Inject 140 mg into the skin every 14 (fourteen) days.   No facility-administered encounter medications on file as of 09/29/2018.   :  Review of Systems:  Out of a complete 14 point review of  systems, all are reviewed and negative with  the exception of these symptoms as listed below: Review of Systems  Neurological:       Pt presents today for his cpap follow up.    Objective:  Neurological Exam  Physical Exam Physical Examination:   Vitals:   09/29/18 1519  BP: (!) 160/80  Pulse: 66    General Examination: The patient is a very pleasant 58 y.o. male in no acute distress. He appears well-developed and well-nourished and well groomed. Slightly anxious appearing, more quieter than before.  HEENT:Normocephalic, atraumatic, pupils are equal, round and reactive to light and accommodation. Corrective eyeglasses in place.Extraocular tracking is good without limitation to gaze excursion or nystagmus noted. Normal smooth pursuit is noted. Hearing is grossly intact. Face is symmetric with normal facial animation and normal facial sensation. Speech is clear with no dysarthria noted. There is no hypophonia. There is no lip, neck/head, jaw or voice tremor. Neck shows FROM. Oropharynx exam reveals:moderatemouth dryness, adequatedental hygiene and moderateairway crowding. Tongue protrudes centrally and palate elevates symmetrically. Tonsils are absent.   Chest:Clear to auscultation without wheezing, rhonchi or crackles noted.  Heart:S1+S2+0, irregularly irregular with systolic murmur, ?a fib.   Abdomen:Soft, non-tender and non-distended.  Extremities:There is no pitting edema.  Skin: Warm and dry without trophic changes noted.  Musculoskeletal: exam reveals no obvious joint deformities, tenderness or joint swelling or erythema.   Neurologically:  Mental status: The patient is awake, alert and oriented in all 4 spheres.Hisimmediate and remote memory, attention, language skills and fund of knowledge are appropriate. There is no evidence of aphasia, agnosia, apraxia or anomia. Speech is clear with normal prosody and enunciation. Thought process is linear. Mood is normaland affect is normal.  Cranial nerves II  - XII are as described above under HEENT exam.  Motor exam: Normal bulk, strength and tone is noted. There is no drift, tremor or rebound. Fine motor skills and coordination:grosslyintact.  Cerebellar testing: No dysmetria or intention tremor on finger to nose testing. Heel to shin is unremarkable bilaterally. There is no truncal or gait ataxia.  Sensory exam: intact to light touchin the upper and lower extremities.  Gait, station and balance:Hestands easily. No veering to one side is noted. No leaning to one side is noted. Posture is age-appropriate and stance is narrow based. Gait showsnormalstride length and normalpace. No problems turning are noted.    Assessmentand Plan:   In summary,Lathen J Johnsonis a very pleasant 45 year oldmalewith an underlying medical history of type 2 diabetes with evidence of retinopathy, followed by ophthalmology, heart murmur, reflux disease, hypertension, coronary artery disease with status post drug-eluting stent in 2012, obesity, diagnosis of A. fib with status post cardioversion on 12/02/17, who presents for follow-up consultation of his obstructive sleep apnea. His split-night sleep study from 12/27/2017 indicated severe sleep apnea with an AHI of 97.7 per hour, O2 nadir was 84% during non-REM sleep. He has established treatment on CPAP of 12 cm. He is compliant with treatment and has benefited from treatment including better sleep consolidation, less nocturia, but her daytime energy, less daytime somnolence. He is commended for his treatment adherence. He is using a nasal mask with success. He is advised to monitor his cardiac symptoms.  He is awaiting test results from a recent ambulatory heart monitor for 3 days, he mailed the unit back last week.  He also has an EP appointment pending with Dr. Lovena Le next week.  He is advised to be in touch with his cardiologist  and seek immediate medical attention should he develop chest pain or shortness of breath  or any other acute symptoms. Initially, during our office visit he did have a lower heart rate, the machine detected a heart rate of 33, upon further recheck it was in the 60s, irregularly irregular upon my auscultation but at 1 point it was quite regular with possible PVCs causing some extra heartbeats.Of note, He recently abruptly discontinued his caffeine intake. He is advised to routinely FU in sleep clinic in 1 year.  I answered all his questions today and he was in agreement. I spent 25 minutes in total face-to-face time with the patient, more than 50% of which was spent in counseling and coordination of care, reviewing test results, reviewing medication and discussing or reviewing the diagnosis of OSA, its prognosis and treatment options. Pertinent laboratory and imaging test results that were available during this visit with the patient were reviewed by me and considered in my medical decision making (see chart for details).

## 2018-09-29 NOTE — Patient Instructions (Addendum)
Keep up the good work! From the sleep apnea standpoint you are doing great! We can see you in 1 year, you can see one of our nurse practitioners as you are stable.  I did not see any results from your recent heart monitor test. I cannot be 100% sure if you are in A. fib, your heart rate was quite low at times during our visit today and irregular.  If you have any shortness of breath or palpitations or chest pain or feeling of near passing out, contact your cardiologist.  You have an appointment with Dr. Crissie Sickles next week but may need to see them sooner if you have symptoms.

## 2018-10-06 ENCOUNTER — Telehealth: Payer: Self-pay | Admitting: Internal Medicine

## 2018-10-06 NOTE — Telephone Encounter (Signed)

## 2018-10-07 ENCOUNTER — Ambulatory Visit: Payer: BC Managed Care – PPO | Admitting: Internal Medicine

## 2018-10-07 ENCOUNTER — Encounter: Payer: Self-pay | Admitting: Internal Medicine

## 2018-10-07 ENCOUNTER — Other Ambulatory Visit: Payer: Self-pay

## 2018-10-07 VITALS — BP 142/78 | HR 56 | Ht 66.0 in | Wt 252.2 lb

## 2018-10-07 DIAGNOSIS — I493 Ventricular premature depolarization: Secondary | ICD-10-CM | POA: Diagnosis not present

## 2018-10-07 DIAGNOSIS — I10 Essential (primary) hypertension: Secondary | ICD-10-CM | POA: Diagnosis not present

## 2018-10-07 DIAGNOSIS — I4819 Other persistent atrial fibrillation: Secondary | ICD-10-CM | POA: Diagnosis not present

## 2018-10-07 NOTE — Progress Notes (Signed)
HPI Bradley Holmes is referred today for evaluation of PVC's. He has minimal palpitations. He has not had syncope. He has been found to have PAF and 10% PVC's on a cardiac monitor. He has obesity, HTN, and dyslipidemia. He notes that sometimes his HR by his BP cuff is 30/min. He feels sluggish at times.  Allergies  Allergen Reactions  . Norvasc [Amlodipine Besylate] Other (See Comments)    Edema  . Sulfa Antibiotics Hives     Current Outpatient Medications  Medication Sig Dispense Refill  . Alirocumab (PRALUENT) 150 MG/ML SOAJ Inject 150 mg into the skin every 14 (fourteen) days. 2 pen 12  . apixaban (ELIQUIS) 5 MG TABS tablet Take 5 mg by mouth 2 (two) times daily.    Marland Kitchen aspirin EC 81 MG tablet Take 1 tablet (81 mg total) by mouth daily. (Patient taking differently: Take 81 mg by mouth every evening. ) 90 tablet 3  . BD PEN NEEDLE NANO U/F 32G X 4 MM MISC     . BYSTOLIC 10 MG tablet TAKE 1 TABLET EVERY EVENING (Patient taking differently: Take 10 mg by mouth every evening. ) 30 tablet 2  . Coenzyme Q10 (CO Q-10) 200 MG CAPS Take 200 mg by mouth daily.     . dapagliflozin propanediol (FARXIGA) 10 MG TABS tablet Take 10 mg by mouth daily.    . hydrocortisone cream ([employer]S CORTISONE MAXIMUM STRENGTH) 1 % Apply 1 application topically as needed for itching.    . Insulin Aspart (NOVOLOG FLEXPEN [place]) Inject 20 Units into the skin 3 (three) times daily with meals.     . nitroGLYCERIN (NITROSTAT) 0.4 MG SL tablet PLACE 1 TABLET (0.4 MG TOTAL) UNDER THE TONGUE EVERY 5 (FIVE) MINUTES AS NEEDED FOR CHEST PAIN. 25 tablet 3  . omeprazole (PRILOSEC) 20 MG capsule Take 20 mg by mouth daily.    . ONE TOUCH ULTRA TEST test strip     . ONETOUCH DELICA LANCETS 99M MISC Apply 1 strip topically 3 (three) times daily.  8  . Potassium Chloride ER 20 MEQ TBCR Take 20 mEq by mouth 3 (three) times daily.     . TRESIBA FLEXTOUCH 200 UNIT/ML SOPN Inject 92 Units as directed daily.   6  . triamcinolone cream  (KENALOG) 0.1 % Apply 1 application topically 2 (two) times daily.    Bradley Holmes 40-10-25 MG TABS Take 1 tablet by mouth daily.      No current facility-administered medications for this visit.      Past Medical History:  Diagnosis Date  . Coronary artery disease    LEXISCAN, 01/05/2011 - defect seen in Basal Inferior, Mid Inferior, and Apical Lateral region(s)-consistent with infarct/scar, global LV systolic function mildly reduced, abnormal study  . GERD (gastroesophageal reflux disease)   . Hyperlipidemia   . Hypertension    2D ECHO, 10/10/2010 - EF >55%, normal  . Insulin dependent diabetes mellitus ([place])   . Pseudoaneurysm ([place])    LEA DOPPLER, 11/01/2010 - right groin hematoma measuring 2.47x0.62x1.61 centimeters in greatest diameter    ROS:   All systems reviewed and negative except as noted in the HPI.   Past Surgical History:  Procedure Laterality Date  . CARDIAC CATHETERIZATION  10/24/2010   CTI - 3030 Resolute drug-eluting stent, 3.71mm resulting reduction og a long CT to 0% residual with excellent flow.  Marland Kitchen CARDIOVERSION N/A 12/02/2017   Procedure: CARDIOVERSION;  Surgeon: Larey Dresser, MD;  Location[facility];  Service: Cardiovascular;  Laterality: N/A;     Family History  Problem Relation Age of Onset  . Heart disease Mother 25       Stent placement  . Diabetes Mother   . Diabetes Sister   . Thyroid cancer Brother        2010  . Prostate cancer Maternal Grandfather   . Emphysema Paternal Grandfather      Social History   Socioeconomic History  . Marital status: Single    Spouse name: Not on file  . Number of children: Not on file  . Years of education: Not on file  . Highest education level: Not on file  Occupational History  . Not on file  Social Needs  . Financial resource strain: Not on file  . Food insecurity    Worry: Not on file    Inability: Not on file  . Transportation needs    Medical: Not on file    Non-medical: Not on file   Tobacco Use  . Smoking status: Never Smoker  . Smokeless tobacco: Never Used  Substance and Sexual Activity  . Alcohol use: Yes    Comment: occassionally  . Drug use: No  . Sexual activity: Not on file  Lifestyle  . Physical activity    Days per week: Not on file    Minutes per session: Not on file  . Stress: Not on file  Relationships  . Social Herbalist on phone: Not on file    Gets together: Not on file    Attends religious service: Not on file    Active member of club or organization: Not on file    Attends meetings of clubs or organizations: Not on file    Relationship status: Not on file  . Intimate partner violence    Fear of current or ex partner: Not on file    Emotionally abused: Not on file    Physically abused: Not on file    Forced sexual activity: Not on file  Other Topics Concern  . Not on file  Social History Narrative  . Not on file     BP (!) 142/78   Pulse (!) 56   Ht 5\' 6"  (1.676 m)   Wt 252 lb 3.2 oz (114.4 kg)   SpO2 97%   BMI 40.71 kg/m   Physical Exam:  Well appearing NAD HEENT: Unremarkable Neck:  No JVD, no thyromegally Lymphatics:  No adenopathy Back:  No CVA tenderness Lungs:  Clear with no wheezes HEART:  Regular rate rhythm, no murmurs, no rubs, no clicks Abd:  soft, positive bowel sounds, no organomegally, no rebound, no guarding Ext:  2 plus pulses, no edema, no cyanosis, no clubbing Skin:  No rashes no nodules Neuro:  CN II through XII intact, motor grossly intact  EKG - reviewed. NSR with PVC's  Assess/Plan: 1. PVC's - his symptoms are mild. I discussed the treatment options as well as the mostly good prognosis. He will continue his bystolic. He is not a candidate for flecainide or propafenone with CAD. Amio, sotalol and mexitil are all options but for now I would suggest we undergo watchful waiting.  2. CAD - he denies anginal symptoms. I do not think that his PVC's are due to acute ischemia.  3. HTN - his  systolic bp is up a bit. He will continue his current meds.   Bradley Holmes.D.

## 2018-10-07 NOTE — Patient Instructions (Addendum)
Medication Instructions:  Your physician recommends that you continue on your current medications as directed. Please refer to the Current Medication list given to you today.  Labwork: None ordered.  Testing/Procedures: None ordered.  Follow-Up: Your physician wants you to follow-up in: 3 months with Dr. Taylor.      Any Other Special Instructions Will Be Listed Below (If Applicable).  If you need a refill on your cardiac medications before your next appointment, please call your pharmacy.   

## 2018-10-14 ENCOUNTER — Ambulatory Visit: Payer: BC Managed Care – PPO | Admitting: Cardiovascular Disease

## 2018-10-14 ENCOUNTER — Other Ambulatory Visit: Payer: Self-pay

## 2018-10-14 ENCOUNTER — Encounter: Payer: Self-pay | Admitting: Cardiovascular Disease

## 2018-10-14 DIAGNOSIS — I2510 Atherosclerotic heart disease of native coronary artery without angina pectoris: Secondary | ICD-10-CM | POA: Diagnosis not present

## 2018-10-14 DIAGNOSIS — I4819 Other persistent atrial fibrillation: Secondary | ICD-10-CM

## 2018-10-14 DIAGNOSIS — G4733 Obstructive sleep apnea (adult) (pediatric): Secondary | ICD-10-CM

## 2018-10-14 DIAGNOSIS — E782 Mixed hyperlipidemia: Secondary | ICD-10-CM

## 2018-10-14 DIAGNOSIS — I10 Essential (primary) hypertension: Secondary | ICD-10-CM | POA: Diagnosis not present

## 2018-10-14 NOTE — Assessment & Plan Note (Signed)
History of CAD status post cardiac catheterization 10/24/2010 revealing occluded circumflex in the mid AV groove, 50% mid LAD and proximal PDA stenosis with normal LV function.  Was ultimately able to cross this and stented with a 3 mm x 30 mm long resolute drug-eluting stent.  He is done well since that time.  Follow-up Myoview showed proved perfusion to the lateral wall.  Patient denies chest pain or shortness of breath.

## 2018-10-14 NOTE — Assessment & Plan Note (Signed)
History of obstructive sleep apnea on CPAP which he benefits from 

## 2018-10-14 NOTE — Assessment & Plan Note (Signed)
History of essential hypertension with blood pressure measured today 142/84.  He is on Geophysical data processor.

## 2018-10-14 NOTE — Assessment & Plan Note (Signed)
History of persistent atrial fibrillation status post successful DC cardioversion by Dr. Aundra Dubin 12/02/2017.  He recently saw Dr. Lovena Le complaining of some tachypalpitations and Holter monitor showed episodes of PAF with frequent PVCs.  He has been maintained on Eliquis oral anticoagulation.  Options that were discussed included A. fib ablation.

## 2018-10-14 NOTE — Assessment & Plan Note (Signed)
History of hyperlipidemia on Repatha with lipid profile performed 01/24/2018 revealing total cholesterol of 90, LDL 38 and HDL 38

## 2018-10-14 NOTE — Progress Notes (Signed)
10/14/2018 Bradley Holmes   1960-09-21  [id number]  Primary Physician [name], MD Primary Cardiologist: [name] MD FACP, [place], [place], [place]  HPI:  Bradley Holmes is a 58 y.o.   mildly overweight single Caucasian male with no children whom I last saw in the  11/20/2017. He relocated to [place] to be the [employer] of [employer], previously at [employer] in [place]. He was initially referred because of an abnormal EKG with risk factors that included diabetes, hypertension and hyperlipidemia as well as family history. He was getting some chest pain and dyspnea. His EKG showed poor R-wave progression with Q-waves, a left anterior fascicular block. Myoview stress test showed inferolateral scar with mild peri/infarct ischemia, and because of this I performed cardiac catheterization on him 10/24/2010 revealing an occluded circumflex in the mid AV groove, 50% mid LAD and proximal PDA stenosis with normal LV function. I was ultimately able to cross his chronically occluded circumflex and stented him with a resolute 3.0 x 30 drug-eluting stent. Since that time, he had done well. A followup Myoview showed improved perfusion to the lateral wall.  Because of statin intolerance he is beginning Repatha which resulted in marked improvement in his lipid profile. Since I saw him in November 2018 he is noticed increasing dyspnea on exertion this past spring. He saw his PCP recently he noticed that his heart rate was irregular and he was diagnosed with A. fib and begun on Eliquis oral anticoagulation.  I arrange for him to undergo A. fib ablation by Dr. Algernon Huxley 12/02/2017 which was performed successfully.  He remains on Eliquis oral anticoagulation.  He does have obstructive sleep apnea on CPAP.  He has recently noticed some episodes of palpitations and saw Dr. Lovena Le who ordered a monitor revealing PAF and frequent PVCs.   Current Meds  Medication Sig  . apixaban (ELIQUIS)  5 MG TABS tablet Take 5 mg by mouth 2 (two) times daily.  Marland Kitchen aspirin EC 81 MG tablet Take 1 tablet (81 mg total) by mouth daily. (Patient taking differently: Take 81 mg by mouth every evening. )  . BYSTOLIC 10 MG tablet TAKE 1 TABLET EVERY EVENING (Patient taking differently: Take 10 mg by mouth every evening. )  . Coenzyme Q10 (CO Q-10) 200 MG CAPS Take 200 mg by mouth daily.   . dapagliflozin propanediol (FARXIGA) 10 MG TABS tablet Take 10 mg by mouth daily.  . hydrocortisone cream ([employer] CORTISONE MAXIMUM STRENGTH) 1 % Apply 1 application topically as needed for itching.  . Insulin Aspart (NOVOLOG FLEXPEN [place]) Inject 20 Units into the skin 3 (three) times daily with meals.   . nitroGLYCERIN (NITROSTAT) 0.4 MG SL tablet PLACE 1 TABLET (0.4 MG TOTAL) UNDER THE TONGUE EVERY 5 (FIVE) MINUTES AS NEEDED FOR CHEST PAIN.  Marland Kitchen omeprazole (PRILOSEC) 20 MG capsule Take 20 mg by mouth daily.  . ONE TOUCH ULTRA TEST test strip   . ONETOUCH DELICA LANCETS 17B MISC Apply 1 strip topically 3 (three) times daily.  . Potassium Chloride ER 20 MEQ TBCR Take 20 mEq by mouth 3 (three) times daily.   . TRESIBA FLEXTOUCH 200 UNIT/ML SOPN Inject 92 Units as directed daily.   Marland Kitchen triamcinolone cream (KENALOG) 0.1 % Apply 1 application topically 2 (two) times daily.  [name] 40-10-25 MG TABS Take 1 tablet by mouth daily.      Allergies  Allergen Reactions  . Norvasc [Amlodipine Besylate] Other (See Comments)    Edema  .  Sulfa Antibiotics Hives    Social History   Socioeconomic History  . Marital status: Single    Spouse name: Not on file  . Number of children: Not on file  . Years of education: Not on file  . Highest education level: Not on file  Occupational History  . Not on file  Social Needs  . Financial resource strain: Not on file  . Food insecurity    Worry: Not on file    Inability: Not on file  . Transportation needs    Medical: Not on file    Non-medical: Not on file  Tobacco Use  . Smoking  status: Never Smoker  . Smokeless tobacco: Never Used  Substance and Sexual Activity  . Alcohol use: Yes    Comment: occassionally  . Drug use: No  . Sexual activity: Not on file  Lifestyle  . Physical activity    Days per week: Not on file    Minutes per session: Not on file  . Stress: Not on file  Relationships  . Social Herbalist on phone: Not on file    Gets together: Not on file    Attends religious service: Not on file    Active member of club or organization: Not on file    Attends meetings of clubs or organizations: Not on file    Relationship status: Not on file  . Intimate partner violence    Fear of current or ex partner: Not on file    Emotionally abused: Not on file    Physically abused: Not on file    Forced sexual activity: Not on file  Other Topics Concern  . Not on file  Social History Narrative  . Not on file     Review of Systems: General: negative for chills, fever, night sweats or weight changes.  Cardiovascular: negative for chest pain, dyspnea on exertion, edema, orthopnea, palpitations, paroxysmal nocturnal dyspnea or shortness of breath Dermatological: negative for rash Respiratory: negative for cough or wheezing Urologic: negative for hematuria Abdominal: negative for nausea, vomiting, diarrhea, bright red blood per rectum, melena, or hematemesis Neurologic: negative for visual changes, syncope, or dizziness All other systems reviewed and are otherwise negative except as noted above.    Blood pressure (!) 142/84, pulse (!) 58, temperature (!) 97.5 F (36.4 C), height 5\' 7"  (1.702 m), weight 250 lb (113.4 kg), SpO2 99 %.  General appearance: alert and no distress Neck: no adenopathy, no carotid bruit, no JVD, supple, symmetrical, trachea midline and thyroid not enlarged, symmetric, no tenderness/mass/nodules Lungs: clear to auscultation bilaterally Heart: regular rate and rhythm, S1, S2 normal, no murmur, click, rub or gallop  Extremities: extremities normal, atraumatic, no cyanosis or edema Pulses: 2+ and symmetric Skin: Skin color, texture, turgor normal. No rashes or lesions Neurologic: Alert and oriented X 3, normal strength and tone. Normal symmetric reflexes. Normal coordination and gait  EKG not performed today  ASSESSMENT AND PLAN:   Coronary artery disease History of CAD status post cardiac catheterization 10/24/2010 revealing occluded circumflex in the mid AV groove, 50% mid LAD and proximal PDA stenosis with normal LV function.  Was ultimately able to cross this and stented with a 3 mm x 30 mm long resolute drug-eluting stent.  He is done well since that time.  Follow-up Myoview showed proved perfusion to the lateral wall.  Patient denies chest pain or shortness of breath.  Essential hypertension History of essential hypertension with blood pressure measured today 142/84.  He is on Geophysical data processor.  Hyperlipidemia History of hyperlipidemia on Repatha with lipid profile performed 01/24/2018 revealing total cholesterol of 90, LDL 38 and HDL 38  Obstructive sleep apnea History of obstructive sleep apnea on CPAP which he benefits from  Persistent atrial fibrillation (HCC) History of persistent atrial fibrillation status post successful DC cardioversion by Dr. Aundra Dubin 12/02/2017.  He recently saw Dr. Lovena Le complaining of some tachypalpitations and Holter monitor showed episodes of PAF with frequent PVCs.  He has been maintained on Eliquis oral anticoagulation.  Options that were discussed included A. fib ablation.      [name] MD FACP,FACC,FAHA, [facility] 10/14/2018 3:55 PM

## 2018-10-14 NOTE — Patient Instructions (Signed)

## 2019-01-08 ENCOUNTER — Encounter: Payer: Self-pay | Admitting: Internal Medicine

## 2019-01-08 ENCOUNTER — Other Ambulatory Visit: Payer: Self-pay

## 2019-01-08 ENCOUNTER — Ambulatory Visit: Payer: BC Managed Care – PPO | Admitting: Internal Medicine

## 2019-01-08 VITALS — BP 142/84 | HR 65 | Ht 67.0 in | Wt 247.0 lb

## 2019-01-08 DIAGNOSIS — I2510 Atherosclerotic heart disease of native coronary artery without angina pectoris: Secondary | ICD-10-CM

## 2019-01-08 DIAGNOSIS — I493 Ventricular premature depolarization: Secondary | ICD-10-CM

## 2019-01-08 DIAGNOSIS — I10 Essential (primary) hypertension: Secondary | ICD-10-CM | POA: Diagnosis not present

## 2019-01-08 NOTE — Progress Notes (Signed)
HPI Bradley Holmes returns today for followup. He is a pleasant 58 yo man with a h/o PAF, PVC's and HTN. He not gained any weight. He was found to have 10% PVC's and PAF on a cardiac monitor. In the interim, he has done well with no chest pain or sob. He has not had syncope. No angina. He admits to being sedentary. Allergies  Allergen Reactions  . Norvasc [Amlodipine Besylate] Other (See Comments)    Edema  . Sulfa Antibiotics Hives     Current Outpatient Medications  Medication Sig Dispense Refill  . Alirocumab (PRALUENT) 150 MG/ML SOAJ Inject 150 mg into the skin every 14 (fourteen) days. 2 pen 12  . apixaban (ELIQUIS) 5 MG TABS tablet Take 5 mg by mouth 2 (two) times daily.    Marland Kitchen aspirin EC 81 MG tablet Take 1 tablet (81 mg total) by mouth daily. (Patient taking differently: Take 81 mg by mouth every evening. ) 90 tablet 3  . BD PEN NEEDLE NANO U/F 32G X 4 MM MISC     . BYSTOLIC 10 MG tablet TAKE 1 TABLET EVERY EVENING (Patient taking differently: Take 10 mg by mouth every evening. ) 30 tablet 2  . Coenzyme Q10 (CO Q-10) 200 MG CAPS Take 200 mg by mouth daily.     . dapagliflozin propanediol (FARXIGA) 10 MG TABS tablet Take 10 mg by mouth daily.    . hydrocortisone cream (CVS CORTISONE MAXIMUM STRENGTH) 1 % Apply 1 application topically as needed for itching.    . Insulin Aspart (NOVOLOG FLEXPEN [place]) Inject 20 Units into the skin 3 (three) times daily with meals.     . nitroGLYCERIN (NITROSTAT) 0.4 MG SL tablet PLACE 1 TABLET (0.4 MG TOTAL) UNDER THE TONGUE EVERY 5 (FIVE) MINUTES AS NEEDED FOR CHEST PAIN. 25 tablet 3  . omeprazole (PRILOSEC) 20 MG capsule Take 20 mg by mouth daily.    . ONE TOUCH ULTRA TEST test strip     . ONETOUCH DELICA LANCETS 99991111 MISC Apply 1 strip topically 3 (three) times daily.  8  . Potassium Chloride ER 20 MEQ TBCR Take 20 mEq by mouth 3 (three) times daily.     Marland Kitchen spironolactone (ALDACTONE) 25 MG tablet Take 25 mg by mouth every morning.    . TRESIBA  FLEXTOUCH 200 UNIT/ML SOPN Inject 92 Units as directed daily.   6  . triamcinolone cream (KENALOG) 0.1 % Apply 1 application topically 2 (two) times daily.    [name] 40-10-25 MG TABS Take 1 tablet by mouth daily.      No current facility-administered medications for this visit.      Past Medical History:  Diagnosis Date  . Coronary artery disease    LEXISCAN, 01/05/2011 - defect seen in Basal Inferior, Mid Inferior, and Apical Lateral region(s)-consistent with infarct/scar, global LV systolic function mildly reduced, abnormal study  . GERD (gastroesophageal reflux disease)   . Hyperlipidemia   . Hypertension    2D ECHO, 10/10/2010 - EF >55%, normal  . Insulin dependent diabetes mellitus   . Pseudoaneurysm ([place])    LEA DOPPLER, 11/01/2010 - right groin hematoma measuring 2.47x0.62x1.61 centimeters in greatest diameter    ROS:   All systems reviewed and negative except as noted in the HPI.   Past Surgical History:  Procedure Laterality Date  . CARDIAC CATHETERIZATION  10/24/2010   CTI - 3030 Resolute drug-eluting stent, 3.30mm resulting reduction og a long CT to 0% residual with excellent flow.  Marland Kitchen  CARDIOVERSION N/A 12/02/2017   Procedure: CARDIOVERSION;  Surgeon: Larey Dresser, MD;  Location: [facility] ENDOSCOPY;  Service: Cardiovascular;  Laterality: N/A;     Family History  Problem Relation Age of Onset  . Heart disease Mother 18       Stent placement  . Diabetes Mother   . Diabetes Sister   . Thyroid cancer Brother        2010  . Prostate cancer Maternal Grandfather   . Emphysema Paternal Grandfather      Social History   Socioeconomic History  . Marital status: Single    Spouse name: Not on file  . Number of children: Not on file  . Years of education: Not on file  . Highest education level: Not on file  Occupational History  . Not on file  Social Needs  . Financial resource strain: Not on file  . Food insecurity    Worry: Not on file    Inability: Not on  file  . Transportation needs    Medical: Not on file    Non-medical: Not on file  Tobacco Use  . Smoking status: Never Smoker  . Smokeless tobacco: Never Used  Substance and Sexual Activity  . Alcohol use: Yes    Comment: occassionally  . Drug use: No  . Sexual activity: Not on file  Lifestyle  . Physical activity    Days per week: Not on file    Minutes per session: Not on file  . Stress: Not on file  Relationships  . Social Herbalist on phone: Not on file    Gets together: Not on file    Attends religious service: Not on file    Active member of club or organization: Not on file    Attends meetings of clubs or organizations: Not on file    Relationship status: Not on file  . Intimate partner violence    Fear of current or ex partner: Not on file    Emotionally abused: Not on file    Physically abused: Not on file    Forced sexual activity: Not on file  Other Topics Concern  . Not on file  Social History Narrative  . Not on file     BP (!) 142/84   Pulse 65   Ht 5\' 7"  (1.702 m)   Wt 247 lb (112 kg)   SpO2 98%   BMI 38.69 kg/m   Physical Exam:  Well appearing NAD HEENT: Unremarkable Neck:  No JVD, no thyromegally Lymphatics:  No adenopathy Back:  No CVA tenderness Lungs:  Clear HEART:  Regular rate rhythm, no murmurs, no rubs, no clicks Abd:  soft, positive bowel sounds, no organomegally, no rebound, no guarding Ext:  2 plus pulses, no edema, no cyanosis, no clubbing Skin:  No rashes no nodules Neuro:  CN II through XII intact, motor grossly intact  EKG - NSR  Assess/Plan: 1. PAF - he is maintaining NSR. He will continue his current meds. 2. PVC's - he is well controlled and is asymptomatic. I would not recommend additional AA drug therapy at this time.  3. Obesity - I encouraged the patient to lose weight.   Mikle Bosworth.D.

## 2019-01-08 NOTE — Patient Instructions (Addendum)
Medication Instructions:  Your physician recommends that you continue on your current medications as directed. Please refer to the Current Medication list given to you today.  Labwork: None ordered.  Testing/Procedures: None ordered.  Follow-Up: Your physician wants you to follow-up in: as needed with Dr. Taylor.      Any Other Special Instructions Will Be Listed Below (If Applicable).  If you need a refill on your cardiac medications before your next appointment, please call your pharmacy.   

## 2019-01-27 ENCOUNTER — Encounter: Payer: Self-pay | Admitting: Gastroenterology

## 2019-04-06 ENCOUNTER — Other Ambulatory Visit: Payer: Self-pay | Admitting: Cardiovascular Disease

## 2019-07-01 ENCOUNTER — Encounter: Payer: Self-pay | Admitting: Physician Assistant

## 2019-07-14 ENCOUNTER — Encounter: Payer: Self-pay | Admitting: Physician Assistant

## 2019-07-14 ENCOUNTER — Other Ambulatory Visit: Payer: Self-pay

## 2019-07-14 ENCOUNTER — Ambulatory Visit: Payer: BC Managed Care – PPO | Admitting: Physician Assistant

## 2019-07-14 ENCOUNTER — Telehealth: Payer: Self-pay | Admitting: *Deleted

## 2019-07-14 ENCOUNTER — Other Ambulatory Visit: Payer: Self-pay | Admitting: Physician Assistant

## 2019-07-14 VITALS — BP 130/90 | HR 60 | Temp 98.2°F | Ht 67.0 in | Wt 265.0 lb

## 2019-07-14 DIAGNOSIS — Z86010 Personal history of colonic polyps: Secondary | ICD-10-CM

## 2019-07-14 DIAGNOSIS — R1013 Epigastric pain: Secondary | ICD-10-CM

## 2019-07-14 DIAGNOSIS — K219 Gastro-esophageal reflux disease without esophagitis: Secondary | ICD-10-CM

## 2019-07-14 MED ORDER — PEG 3350/ELECTROLYTES 240 G PO SOLR: 1.0000 | Freq: Once | ORAL | 0 refills | Status: DC

## 2019-07-14 MED ORDER — NA SULFATE-K SULFATE-MG SULF 17.5-3.13-1.6 GM/177ML PO SOLN: 1.0000 | Freq: Once | ORAL | 0 refills | Status: DC

## 2019-07-14 NOTE — Progress Notes (Addendum)
Chief Complaint: Abdominal pain, history of polyps  HPI:    Bradley Holmes is a 59 year old Caucasian male with a past medical history as listed below including CAD and A. fib on Eliquis (10/21/2017 echo with LVEF 55%), as well as others listed below, known to Dr. Silverio Decamp, who was referred to me by [name], MD for a complaint of abdominal pain.      02/10/2014 colonoscopy Dr. Olevia Perches with 3 polyps and diverticulosis.  Pathology showed tubular adenoma and repeat was recommended in 5 years.    06/02/2019 BMP normal    06/24/2019 office visit with PCP for upper abdominal pain.  At that time ordered a CT as below and also increased omeprazole to twice daily.  They recommended a possible EGD and colonoscopy.    06/26/2019 CT abdomen pelvis without IV contrast was normal.    Today, the patient tells me that for the past 6 months he has had an epigastric/left upper quadrant pain which "hurts when I push on it", this seems to be worse later in the day, " when I have been sitting at my desk all day", initially this is a 4-5/10 but later in the day can get to a 7-8/10, especially right before he goes to sleep.  Describes as a dull aching pain.  Does not change with eating or change in position.  Recently increased Omeprazole 20 mg to twice daily and this did not change his symptoms.  Denies any inciting event or accident or fall that he can remember.  No NSAID use.  Has not changed diet or other medications.    Also reports history of polyps with Dr. Olevia Perches tells me he got a note in the mail that he needed a repeat colonoscopy last year from Dr. Silverio Decamp but "Covid happened".  Patient is now fully vaccinated.    Denies fever, chills, weight loss, change in bowel habits, anorexia, nausea, vomiting, heartburn, reflux or symptoms that awaken him from sleep.  Past Medical History:  Diagnosis Date  . Coronary artery disease    LEXISCAN, 01/05/2011 - defect seen in Basal Inferior, Mid Inferior, and Apical  Lateral region(s)-consistent with infarct/scar, global LV systolic function mildly reduced, abnormal study  . GERD (gastroesophageal reflux disease)   . Hyperlipidemia   . Hypertension    2D ECHO, 10/10/2010 - EF >55%, normal  . Insulin dependent diabetes mellitus   . Pseudoaneurysm ([place])    LEA DOPPLER, 11/01/2010 - right groin hematoma measuring 2.47x0.62x1.61 centimeters in greatest diameter    Past Surgical History:  Procedure Laterality Date  . CARDIAC CATHETERIZATION  10/24/2010   CTI - 3030 Resolute drug-eluting stent, 3.32mm resulting reduction og a long CT to 0% residual with excellent flow.  Marland Kitchen CARDIOVERSION N/A 12/02/2017   Procedure: CARDIOVERSION;  Surgeon: Larey Dresser, MD;  Location: [facility] ENDOSCOPY;  Service: Cardiovascular;  Laterality: N/A;    Current Outpatient Medications  Medication Sig Dispense Refill  . Alirocumab (PRALUENT) 150 MG/ML SOAJ Inject 150 mg into the skin every 14 (fourteen) days. 2 pen 12  . apixaban (ELIQUIS) 5 MG TABS tablet Take 5 mg by mouth 2 (two) times daily.    Marland Kitchen aspirin EC 81 MG tablet Take 1 tablet (81 mg total) by mouth daily. (Patient taking differently: Take 81 mg by mouth every evening. ) 90 tablet 3  . BD PEN NEEDLE NANO U/F 32G X 4 MM MISC     . BYSTOLIC 10 MG tablet TAKE 1 TABLET EVERY EVENING (Patient taking differently:  Take 10 mg by mouth every evening. ) 30 tablet 2  . Coenzyme Q10 (CO Q-10) 200 MG CAPS Take 200 mg by mouth daily.     . dapagliflozin propanediol (FARXIGA) 10 MG TABS tablet Take 10 mg by mouth daily.    . hydrocortisone cream (CVS CORTISONE MAXIMUM STRENGTH) 1 % Apply 1 application topically as needed for itching.    . Insulin Aspart (NOVOLOG FLEXPEN [place]) Inject 20 Units into the skin 3 (three) times daily with meals.     . nitroGLYCERIN (NITROSTAT) 0.4 MG SL tablet PLACE 1 TABLET (0.4 MG TOTAL) UNDER THE TONGUE EVERY 5 (FIVE) MINUTES AS NEEDED FOR CHEST PAIN. 25 tablet 3  . omeprazole (PRILOSEC) 20 MG capsule Take 20  mg by mouth daily.    . ONE TOUCH ULTRA TEST test strip     . ONETOUCH DELICA LANCETS 99991111 MISC Apply 1 strip topically 3 (three) times daily.  8  . Potassium Chloride ER 20 MEQ TBCR Take 20 mEq by mouth 3 (three) times daily.     Marland Kitchen spironolactone (ALDACTONE) 25 MG tablet Take 25 mg by mouth every morning.    . TRESIBA FLEXTOUCH 200 UNIT/ML SOPN Inject 92 Units as directed daily.   6  . triamcinolone cream (KENALOG) 0.1 % Apply 1 application topically 2 (two) times daily.    [name] 40-10-25 MG TABS Take 1 tablet by mouth daily.      No current facility-administered medications for this visit.    Allergies as of 07/14/2019 - Review Complete 07/14/2019  Allergen Reaction Noted  . Norvasc [amlodipine besylate] Other (See Comments) 08/08/2012  . Sulfa antibiotics Hives 08/08/2012    Family History  Problem Relation Age of Onset  . Heart disease Mother 5       Stent placement  . Diabetes Mother   . Diabetes Sister   . Thyroid cancer Brother        2010  . Prostate cancer Maternal Grandfather   . Emphysema Paternal Grandfather     Social History   Socioeconomic History  . Marital status: Single    Spouse name: Not on file  . Number of children: Not on file  . Years of education: Not on file  . Highest education level: Not on file  Occupational History  . Not on file  Tobacco Use  . Smoking status: Never Smoker  . Smokeless tobacco: Never Used  Substance and Sexual Activity  . Alcohol use: Yes    Comment: occassionally  . Drug use: No  . Sexual activity: Not on file  Other Topics Concern  . Not on file  Social History Narrative  . Not on file   Social Determinants of Health   Financial Resource Strain:   . Difficulty of Paying Living Expenses:   Food Insecurity:   . Worried About Charity fundraiser in the Last Year:   . Arboriculturist in the Last Year:   Transportation Needs:   . Film/video editor (Medical):   Marland Kitchen Lack of Transportation (Non-Medical):    Physical Activity:   . Days of Exercise per Week:   . Minutes of Exercise per Session:   Stress:   . Feeling of Stress :   Social Connections:   . Frequency of Communication with Friends and Family:   . Frequency of Social Gatherings with Friends and Family:   . Attends Religious Services:   . Active Member of Clubs or Organizations:   . Attends Club  or Organization Meetings:   Marland Kitchen Marital Status:   Intimate Partner Violence:   . Fear of Current or Ex-Partner:   . Emotionally Abused:   Marland Kitchen Physically Abused:   . Sexually Abused:     Review of Systems:    Constitutional: No weight loss, fever or chills Skin: No rash  Cardiovascular: No chest pain Respiratory: No SOB Gastrointestinal: See HPI and otherwise negative Genitourinary: No dysuria Neurological: No headache, dizziness or syncope Musculoskeletal: No new muscle or joint pain Hematologic: No bleeding  Psychiatric: No history of depression or anxiety   Physical Exam:  Vital signs: BP 130/90   Pulse 60   Temp 98.2 F (36.8 C)   Ht 5\' 7"  (1.702 m)   Wt 265 lb (120.2 kg)   BMI 41.50 kg/m   Constitutional:   Pleasant overweight Caucasian male appears to be in NAD, Well developed, Well nourished, alert and cooperative Head:  Normocephalic and atraumatic. Eyes:   PEERL, EOMI. No icterus. Conjunctiva pink. Ears:  Normal auditory acuity. Neck:  Supple Throat: Oral cavity and pharynx without inflammation, swelling or lesion.  Respiratory: Respirations even and unlabored. Lungs clear to auscultation bilaterally.   No wheezes, crackles, or rhonchi.  Cardiovascular: Normal S1, S2. No MRG. Regular rate and rhythm. No peripheral edema, cyanosis or pallor.  Gastrointestinal:  Soft, nondistended, nontender. No rebound or guarding. Normal bowel sounds. No appreciable masses or hepatomegaly. Rectal:  Not performed.  Msk:  Symmetrical without gross deformities. Without edema, no deformity or joint abnormality.+pain to palpation of  left lower ribcage  Neurologic:  Alert and  oriented x4;  grossly normal neurologically.  Skin:   Dry and intact without significant lesions or rashes. Psychiatric: Demonstrates good judgement and reason without abnormal affect or behaviors.  See HPI for recent labs.  Assessment: 1.  Upper abdominal pain?: Ct AP w/o contrast normal labs normal, patient is tender over his left lower rib cage today on exam, believe this pain is more than likely musculoskeletal, though patient cannot remember an inciting event, does have history of reflux and a stricture years ago, due to this will proceed with EGD for further eval of possible but not likely gastric origin 2.  History of tubular adenomas: Last colonoscopy 2015 recommendations to repeat in 5 years 3.  Chronic anticoagulation for A. fib: On Eliquis  Plan: 1.  Scheduled patient for an EGD and colonoscopy in the [place] with Dr. Silverio Decamp.  Did discuss risks, benefits, limitations and alternatives and the patient agrees to proceed.  Patient has had both of his Covid vaccines. 2.  Advised patient to hold his Eliquis for 2 days prior to time of procedure.  We will communicate with his prescribing physician to ensure that holding his Eliquis is acceptable for him. 3.  Continue Omeprazole 20 mg twice daily, 30-60 minutes before breakfast and dinner 4.  Discussed placing heating pad over area of pain to see if this helps at all 5.  Patient to follow in clinic per recommendations from Dr. Silverio Decamp after time of procedures.  Ellouise Newer, PA-C [facility] 07/14/2019, 9:27 AM  Cc: [name], MD

## 2019-07-14 NOTE — Progress Notes (Signed)
Reviewed and agree with documentation and assessment and plan. K. Veena Leen Tworek , MD   

## 2019-07-14 NOTE — Telephone Encounter (Signed)
Mailed new instructions with insulin instructions for procedure.

## 2019-07-14 NOTE — Patient Instructions (Addendum)
If you are age 59 or older, your body mass index should be between 23-30. Your Body mass index is 41.5 kg/m. If this is out of the aforementioned range listed, please consider follow up with your Primary Care Provider.  If you are age 65 or younger, your body mass index should be between 19-25. Your Body mass index is 41.5 kg/m. If this is out of the aformentioned range listed, please consider follow up with your Primary Care Provider.   You will be contacted by our office prior to your procedure for directions on holding your Eliquis.  If you do not hear from our office 1 week prior to your scheduled procedure, please call [phone] to discuss.   Continue Omeprazole twice daily.

## 2019-07-14 NOTE — Telephone Encounter (Signed)
   Primary Cardiologist: Dr.Berry  Chart reviewed as part of pre-operative protocol coverage. Given past medical history and time since last visit, based on ACC/AHA guidelines, Bradley Holmes would be at acceptable risk for the planned procedure without further cardiovascular testing. Per pharmacy recommendations, he is to hold Eliquis for 48 hours prior to procedure. This would be 08/15/2019. I have spoken with the patient by telephone to give him these instructions. He is not having any cardiac issues. He verbalizes understanding of Eliquis instructions.  I will route this recommendation to the requesting party via Epic fax function and remove from pre-op pool.  Please call with questions.  Phill Myron. Lorelai Huyser DNP, ANP, AACC  07/14/2019, 1:17 PM

## 2019-07-14 NOTE — Telephone Encounter (Signed)
Patient with diagnosis of afib on Eliquis for anticoagulation.    Procedure: EGD/Colonoscopy Date of procedure: 08/17/19  CHADS2-VASc score of  3 (HTN, DM2, CAD)  CrCl 65 ml/min  Per office protocol, patient can hold Eliquis for 2 days prior to procedure.

## 2019-07-14 NOTE — Telephone Encounter (Signed)
[facility] Pre-operative Risk Assessment     Request for surgical clearance:     Endoscopy Procedure  What type of surgery is being performed?     EGD/Colonoscopy  When is this surgery scheduled?     Monday 08/17/19  What type of clearance is required ?   Pharmacy  Are there any medications that need to be held prior to surgery and how long? Eliquis 2 days  Practice name and name of physician performing surgery?  Harl Bowie, MD   [facility]  What is your office phone and fax number?      Phone- [phone]  [phone]  Anesthesia type (None, local, MAC, general) ?       MAC

## 2019-07-14 NOTE — Telephone Encounter (Signed)
Spoke with patient and confirmed Eliquis hold instructions. Patient voiced understanding.

## 2019-08-04 ENCOUNTER — Encounter (INDEPENDENT_AMBULATORY_CARE_PROVIDER_SITE_OTHER): Payer: Self-pay | Admitting: Ophthalmology

## 2019-08-04 ENCOUNTER — Other Ambulatory Visit: Payer: Self-pay

## 2019-08-04 ENCOUNTER — Ambulatory Visit (INDEPENDENT_AMBULATORY_CARE_PROVIDER_SITE_OTHER): Payer: BC Managed Care – PPO | Admitting: Ophthalmology

## 2019-08-04 DIAGNOSIS — H2511 Age-related nuclear cataract, right eye: Secondary | ICD-10-CM | POA: Diagnosis not present

## 2019-08-04 DIAGNOSIS — E113492 Type 2 diabetes mellitus with severe nonproliferative diabetic retinopathy without macular edema, left eye: Secondary | ICD-10-CM | POA: Diagnosis not present

## 2019-08-04 DIAGNOSIS — E113411 Type 2 diabetes mellitus with severe nonproliferative diabetic retinopathy with macular edema, right eye: Secondary | ICD-10-CM | POA: Insufficient documentation

## 2019-08-04 DIAGNOSIS — E113412 Type 2 diabetes mellitus with severe nonproliferative diabetic retinopathy with macular edema, left eye: Secondary | ICD-10-CM | POA: Diagnosis not present

## 2019-08-04 NOTE — Progress Notes (Signed)
08/04/2019     CHIEF COMPLAINT Patient presents for Retina Follow Up   HISTORY OF PRESENT ILLNESS: Bradley Holmes is a 59 y.o. male who presents to the clinic today for:   HPI    Retina Follow Up    Patient presents with  Diabetic Retinopathy.  In both eyes.  This started 1.5 years ago.  Severity is mild.  Duration of 1.5 years.  Since onset it is stable.          Comments    1.5 Year Diabetic Exam OU  Pt denies noticeable changes to [place] OU since last visit. Pt denies ocular pain, flashes of light, or floaters OU. Pt c/o tired eyes off and on when working on a computer. LBS: 157 in office today A1c: 8.1, 05/2019        Last edited by Rockie Neighbours, [place] on 08/04/2019  2:31 PM. (History)      Referring physician: [name], MD [place],  [place] [id number]  HISTORICAL INFORMATION:   Selected notes from the [place]: No current outpatient medications on file. (Ophthalmic Drugs)   No current facility-administered medications for this visit. (Ophthalmic Drugs)   Current Outpatient Medications (Other)  Medication Sig  . Alirocumab (PRALUENT) 150 MG/ML SOAJ Inject 150 mg into the skin every 14 (fourteen) days.  Marland Kitchen apixaban (ELIQUIS) 5 MG TABS tablet Take 5 mg by mouth 2 (two) times daily.  Marland Kitchen aspirin EC 81 MG tablet Take 1 tablet (81 mg total) by mouth daily. (Patient taking differently: Take 81 mg by mouth every evening. )  . BD PEN NEEDLE NANO U/F 32G X 4 MM MISC   . BYSTOLIC 10 MG tablet TAKE 1 TABLET EVERY EVENING (Patient taking differently: Take 10 mg by mouth every evening. )  . Coenzyme Q10 (CO Q-10) 200 MG CAPS Take 200 mg by mouth daily.   . dapagliflozin propanediol (FARXIGA) 10 MG TABS tablet Take 10 mg by mouth daily.  . hydrocortisone cream ([employer] CORTISONE MAXIMUM STRENGTH) 1 % Apply 1 application topically as needed for itching.  . Insulin Aspart (NOVOLOG FLEXPEN [place]) Inject 20 Units into the skin 3  (three) times daily with meals.   . nitroGLYCERIN (NITROSTAT) 0.4 MG SL tablet PLACE 1 TABLET (0.4 MG TOTAL) UNDER THE TONGUE EVERY 5 (FIVE) MINUTES AS NEEDED FOR CHEST PAIN.  Marland Kitchen omeprazole (PRILOSEC) 20 MG capsule Take 20 mg by mouth daily.  . ONE TOUCH ULTRA TEST test strip   . ONETOUCH DELICA LANCETS 99991111 MISC Apply 1 strip topically 3 (three) times daily.  . Potassium Chloride ER 20 MEQ TBCR Take 20 mEq by mouth 3 (three) times daily.   Marland Kitchen spironolactone (ALDACTONE) 25 MG tablet Take 25 mg by mouth every morning.  . TRESIBA FLEXTOUCH 200 UNIT/ML SOPN Inject 92 Units as directed daily.   Marland Kitchen triamcinolone cream (KENALOG) 0.1 % Apply 1 application topically 2 (two) times daily.  [name] 40-10-25 MG TABS Take 1 tablet by mouth daily.    No current facility-administered medications for this visit. (Other)      REVIEW OF SYSTEMS:    ALLERGIES Allergies  Allergen Reactions  . Norvasc [Amlodipine Besylate] Other (See Comments)    Edema  . Sulfa Antibiotics Hives    PAST MEDICAL HISTORY Past Medical History:  Diagnosis Date  . Coronary artery disease    LEXISCAN, 01/05/2011 - defect seen in Basal Inferior, Mid Inferior, and Apical  Lateral region(s)-consistent with infarct/scar, global LV systolic function mildly reduced, abnormal study  . GERD (gastroesophageal reflux disease)   . Hyperlipidemia   . Hypertension    2D ECHO, 10/10/2010 - EF >55%, normal  . Insulin dependent diabetes mellitus   . Pseudoaneurysm ([place])    LEA DOPPLER, 11/01/2010 - right groin hematoma measuring 2.47x0.62x1.61 centimeters in greatest diameter   Past Surgical History:  Procedure Laterality Date  . CARDIAC CATHETERIZATION  10/24/2010   CTI - 3030 Resolute drug-eluting stent, 3.40mm resulting reduction og a long CT to 0% residual with excellent flow.  Marland Kitchen CARDIOVERSION N/A 12/02/2017   Procedure: CARDIOVERSION;  Surgeon: Larey Dresser, MD;  Location: [facility] ENDOSCOPY;  Service: Cardiovascular;  Laterality:  N/A;    FAMILY HISTORY Family History  Problem Relation Age of Onset  . Heart disease Mother 22       Stent placement  . Diabetes Mother   . Diabetes Sister   . Thyroid cancer Brother        2010  . Prostate cancer Maternal Grandfather   . Emphysema Paternal Grandfather     SOCIAL HISTORY Social History   Tobacco Use  . Smoking status: Never Smoker  . Smokeless tobacco: Never Used  Substance Use Topics  . Alcohol use: Yes    Comment: occassionally  . Drug use: No         OPHTHALMIC EXAM: Base Eye Exam    Visual Acuity (ETDRS)      Right Left   Dist cc 20/20 20/20 -2   Correction: Glasses       Tonometry (Tonopen, 2:36 PM)      Right Left   Pressure 18 17       Pupils      Pupils Dark Light Shape React APD   Right PERRL 4 3 Round Brisk None   Left PERRL 4 3 Round Brisk None       Visual Fields (Counting fingers)      Left Right    Full Full       Extraocular Movement      Right Left    Full Full       Neuro/Psych    Oriented x3: Yes   Mood/Affect: Normal       Dilation    Both eyes: 1.0% Mydriacyl, 2.5% Phenylephrine @ 2:36 PM          IMAGING AND PROCEDURES  Imaging and Procedures for 08/04/19           ASSESSMENT/PLAN:  No problem-specific Assessment & Plan notes found for this encounter.      ICD-10-CM   1. Severe nonproliferative diabetic retinopathy of left eye, with macular edema, associated with type 2 diabetes mellitus (HCC)  [id number] OCT, Retina - OU - Both Eyes  2. Nuclear sclerotic cataract of right eye  H25.11   3. Severe nonproliferative diabetic retinopathy of right eye, with macular edema, associated with type 2 diabetes mellitus ([place])  E11.3411     1.  2.  3.  Ophthalmic Meds Ordered this visit:  No orders of the defined types were placed in this encounter.      No follow-ups on file.  There are no Patient Instructions on file for this visit.   Explained the diagnoses, plan, and follow up with  the patient and they expressed understanding.  Patient expressed understanding of the importance of proper follow up care.   Clent Demark Riham Polyakov M.D. Diseases & Surgery of the Retina and Vitreous  [facility] Diabetic [place] 08/04/19     Abbreviations: M myopia (nearsighted); A astigmatism; H hyperopia (farsighted); P presbyopia; Mrx spectacle prescription;  CTL contact lenses; OD right eye; OS left eye; OU both eyes  XT exotropia; ET esotropia; PEK punctate epithelial keratitis; PEE punctate epithelial erosions; DES dry eye syndrome; MGD meibomian gland dysfunction; ATs artificial tears; PFAT's preservative free artificial tears; [place] nuclear sclerotic cataract; PSC posterior subcapsular cataract; ERM epi-retinal membrane; PVD posterior vitreous detachment; RD retinal detachment; DM diabetes mellitus; DR diabetic retinopathy; NPDR non-proliferative diabetic retinopathy; PDR proliferative diabetic retinopathy; CSME clinically significant macular edema; DME diabetic macular edema; dbh dot blot hemorrhages; CWS cotton wool spot; POAG primary open angle glaucoma; C/D cup-to-disc ratio; HVF humphrey visual field; GVF goldmann visual field; OCT optical coherence tomography; IOP intraocular pressure; BRVO Branch retinal vein occlusion; CRVO central retinal vein occlusion; CRAO central retinal artery occlusion; BRAO branch retinal artery occlusion; RT retinal tear; SB scleral buckle; PPV pars plana vitrectomy; VH Vitreous hemorrhage; PRP panretinal laser photocoagulation; IVK intravitreal kenalog; VMT vitreomacular traction; MH Macular hole;  NVD neovascularization of the disc; NVE neovascularization elsewhere; AREDS age related eye disease study; ARMD age related macular degeneration; POAG primary open angle glaucoma; EBMD epithelial/anterior basement membrane dystrophy; ACIOL anterior chamber intraocular lens; IOL intraocular lens; PCIOL posterior chamber intraocular lens; Phaco/IOL phacoemulsification with  intraocular lens placement; [place] photorefractive keratectomy; LASIK laser assisted in situ keratomileusis; HTN hypertension; DM diabetes mellitus; COPD chronic obstructive pulmonary disease

## 2019-08-04 NOTE — Assessment & Plan Note (Addendum)
The nature of diabetic macular edema was discussed with the patient. Treatment options were outlined including medical therapy, laser & vitrectomy. The use of injectable medications reviewed, including Avastin, Lucentis, and Eylea. Periodic injections into the eye are likely to resolve diabetic macular edema (swelling in the center of vision). Initially, injections are delivered are delivered every 4-6 weeks, and the interval extended as the condition improves. On average, 8-9 injections the first year, and 5 in year 2. Improvement in the condition most often improves on medical therapy. Occasional use of focal laser is also recommended for residual macular edema (swelling). Excellent control of blood glucose and blood pressure are encouraged under the care of a primary physician or endocrinologist. Similarly, attempts to maintain serum cholesterol, low density lipoproteins, and high-density lipoproteins in a favorable range were recommended.   The nature of diabetic retinopathy was explained using the following analogy: "Retinopathy develops in the body's blood supply like salty water corrodes the linings of pipes in a house, until rust appears, then holes in the pipes develop which leak followed by destruction and loss of the pipes as the corrosion turns them to dust. In a similar fashion, Diabetes damages the blood supply of the body by cumulative long--term elevated blood sugar, which corrodes the blood supply in the body, particularly the blood vessels supplying the retina, kidneys, and nerves".  Thus, control of blood sugar, slows the progression of the corrosive effect of diabetes mellitus.     OD will recommend focal laser treatment

## 2019-08-11 ENCOUNTER — Encounter: Payer: Self-pay | Admitting: Gastroenterology

## 2019-08-17 ENCOUNTER — Other Ambulatory Visit: Payer: Self-pay

## 2019-08-17 ENCOUNTER — Encounter: Payer: Self-pay | Admitting: Gastroenterology

## 2019-08-17 ENCOUNTER — Ambulatory Visit (AMBULATORY_SURGERY_CENTER): Payer: BC Managed Care – PPO | Admitting: Gastroenterology

## 2019-08-17 VITALS — BP 123/70 | HR 77 | Temp 96.9°F | Resp 17 | Ht 67.0 in | Wt 265.0 lb

## 2019-08-17 DIAGNOSIS — D128 Benign neoplasm of rectum: Secondary | ICD-10-CM | POA: Diagnosis not present

## 2019-08-17 DIAGNOSIS — K635 Polyp of colon: Secondary | ICD-10-CM

## 2019-08-17 DIAGNOSIS — D123 Benign neoplasm of transverse colon: Secondary | ICD-10-CM

## 2019-08-17 DIAGNOSIS — K2970 Gastritis, unspecified, without bleeding: Secondary | ICD-10-CM | POA: Diagnosis not present

## 2019-08-17 DIAGNOSIS — D122 Benign neoplasm of ascending colon: Secondary | ICD-10-CM

## 2019-08-17 DIAGNOSIS — Z86010 Personal history of colonic polyps: Secondary | ICD-10-CM | POA: Diagnosis not present

## 2019-08-17 DIAGNOSIS — D125 Benign neoplasm of sigmoid colon: Secondary | ICD-10-CM | POA: Diagnosis not present

## 2019-08-17 DIAGNOSIS — R1013 Epigastric pain: Secondary | ICD-10-CM

## 2019-08-17 DIAGNOSIS — K317 Polyp of stomach and duodenum: Secondary | ICD-10-CM | POA: Diagnosis not present

## 2019-08-17 MED ORDER — SODIUM CHLORIDE 0.9 % IV SOLN: 500.0000 mL | Freq: Once | INTRAVENOUS | Status: DC

## 2019-08-17 NOTE — Progress Notes (Signed)
Ephedrine 10 mg given IV due to low BP, MD updated.  

## 2019-08-17 NOTE — Op Note (Addendum)
[place] Patient Name: Bradley Holmes Procedure Date: 08/17/2019 2:47 PM MRN: [id number] Endoscopist: Mauri Pole , MD Age: 59 Referring MD:  Date of Birth: 1960-09-27 Gender: Male Account #: [id number] Procedure:                Colonoscopy Indications:              High risk colon cancer surveillance: Personal                            history of colonic polyps, High risk colon cancer                            surveillance: Personal history of multiple (3 or                            more) adenomas Medicines:                Monitored Anesthesia Care Procedure:                Pre-Anesthesia Assessment:                           - Prior to the procedure, a History and Physical                            was performed, and patient medications and                            allergies were reviewed. The patient's tolerance of                            previous anesthesia was also reviewed. The risks                            and benefits of the procedure and the sedation                            options and risks were discussed with the patient.                            All questions were answered, and informed consent                            was obtained. Prior Anticoagulants: The patient                            last took Eliquis (apixaban) 2 days prior to the                            procedure. ASA Grade Assessment: III - A patient                            with severe systemic disease. After reviewing the  risks and benefits, the patient was deemed in                            satisfactory condition to undergo the procedure.                           After obtaining informed consent, the colonoscope                            was passed under direct vision. Throughout the                            procedure, the patient's blood pressure, pulse, and                            oxygen saturations were monitored  continuously. The                            Colonoscope was introduced through the anus and                            advanced to the the cecum, identified by                            appendiceal orifice and ileocecal valve. The                            colonoscopy was performed without difficulty. The                            patient tolerated the procedure well. The quality                            of the bowel preparation was adequate. The                            ileocecal valve, appendiceal orifice, and rectum                            were photographed. Scope In: 3:18:08 PM Scope Out: 3:54:56 PM Scope Withdrawal Time: 0 hours 31 minutes 11 seconds  Total Procedure Duration: 0 hours 36 minutes 48 seconds  Findings:                 The perianal and digital rectal examinations were                            normal.                           Three sessile polyps were found in the hepatic                            flexure and ascending colon. The polyps were 5 to 8  mm in size. These polyps were removed with a cold                            snare. Resection and retrieval were complete.                           A 11 mm polyp was found in the transverse colon.                            The polyp was flat. The polyp was removed with a                            cold snare. Resection and retrieval were complete.                           Three sessile polyps were found in the rectum,                            sigmoid colon and transverse colon. The polyps were                            1 to 2 mm in size. These polyps were removed with a                            cold biopsy forceps. Resection and retrieval were                            complete.                           A few small-mouthed diverticula were found in the                            sigmoid colon and descending colon.                           Non-bleeding external and internal  hemorrhoids were                            found during retroflexion. The hemorrhoids were                            medium-sized. Complications:            No immediate complications. Estimated Blood Loss:     Estimated blood loss was minimal. Impression:               - Three 5 to 8 mm polyps at the hepatic flexure and                            in the ascending colon, removed with a cold snare.                            Resected and retrieved.                           -  One 11 mm polyp in the transverse colon, removed                            with a cold snare. Resected and retrieved.                           - Three 1 to 2 mm polyps in the rectum, in the                            sigmoid colon and in the transverse colon, removed                            with a cold biopsy forceps. Resected and retrieved.                           - Diverticulosis in the sigmoid colon and in the                            descending colon.                           - Non-bleeding external and internal hemorrhoids. Recommendation:           - Patient has a contact number available for                            emergencies. The signs and symptoms of potential                            delayed complications were discussed with the                            patient. Return to normal activities tomorrow.                            Written discharge instructions were provided to the                            patient.                           - Resume previous diet.                           - Continue present medications.                           - Await pathology results.                           - Resume Eliquis (apixaban) at prior dose in 3 days                            on 08/20/19. Refer to managing physician for further  adjustment of therapy.                           - Repeat colonoscopy in 3 years for surveillance                            based on  pathology results. Mauri Pole, MD 08/17/2019 4:19:42 PM This report has been signed electronically.

## 2019-08-17 NOTE — Progress Notes (Signed)
1314 Nasal tube 7.0 placed due OSA, vss

## 2019-08-17 NOTE — Progress Notes (Signed)
Called to room to assist during endoscopic procedure.  Patient ID and intended procedure confirmed with present staff. Received instructions for my participation in the procedure from the performing physician.  

## 2019-08-17 NOTE — Progress Notes (Signed)
Report given to PACU, vss 

## 2019-08-17 NOTE — Op Note (Addendum)
[place] Patient Name: Bradley Holmes Procedure Date: 08/17/2019 2:48 PM MRN: [id number] Endoscopist: Mauri Pole , MD Age: 59 Referring MD:  Date of Birth: 11/24/1960 Gender: Male Account #: [id number] Procedure:                Upper GI endoscopy Indications:              Epigastric abdominal pain Medicines:                Monitored Anesthesia Care Procedure:                Pre-Anesthesia Assessment:                           - Prior to the procedure, a History and Physical                            was performed, and patient medications and                            allergies were reviewed. The patient's tolerance of                            previous anesthesia was also reviewed. The risks                            and benefits of the procedure and the sedation                            options and risks were discussed with the patient.                            All questions were answered, and informed consent                            was obtained. Prior Anticoagulants: The patient                            last took Eliquis (apixaban) 2 days prior to the                            procedure. ASA Grade Assessment: III - A patient                            with severe systemic disease. After reviewing the                            risks and benefits, the patient was deemed in                            satisfactory condition to undergo the procedure.                           After obtaining informed consent, the endoscope was  passed under direct vision. Throughout the                            procedure, the patient's blood pressure, pulse, and                            oxygen saturations were monitored continuously. The                            Endoscope was introduced through the mouth, and                            advanced to the second part of duodenum. The upper                            GI endoscopy was  accomplished without difficulty.                            The patient tolerated the procedure well. Scope In: Scope Out: Findings:                 The Z-line was regular and was found 35 cm from the                            incisors.                           No gross lesions were noted in the entire esophagus.                           Excessive fluid was found in the stomach alongs                            with pills and small amount of food residue.                           A single 3 mm sessile polyp with no bleeding was                            found in the gastric antrum. The polyp was removed                            with a cold biopsy forceps. Resection and retrieval                            were complete.                           Patchy mild inflammation characterized by                            congestion (edema), erosions and erythema was found  in the entire examined stomach. Biopsies were taken                            with a cold forceps for Helicobacter pylori testing.                           Patchy mildly erythematous mucosa without active                            bleeding and with no stigmata of bleeding was found                            in the first portion of the duodenum.                           The second portion of the duodenum was normal. Complications:            No immediate complications. Estimated Blood Loss:     Estimated blood loss was minimal. Impression:               - Z-line regular, 35 cm from the incisors.                           - No gross lesions in esophagus.                           - Excessive gastric fluid.                           - A single gastric polyp. Resected and retrieved.                           - Gastritis. Biopsied.                           - Erythematous duodenopathy.                           - Normal second portion of the duodenum. Recommendation:           - Patient has a  contact number available for                            emergencies. The signs and symptoms of potential                            delayed complications were discussed with the                            patient. Return to normal activities tomorrow.                            Written discharge instructions were provided to the                            patient.                           -  See the other procedure note for documentation of                            additional recommendations.                           - Await pathology results.                           - Will consider further evaluation, gastric                            emptying scan to exclude gastroparesis after                            reviewing path results.                           - Follow up in office visit next available                            appointment Mauri Pole, MD 08/17/2019 4:09:23 PM This report has been signed electronically.

## 2019-08-17 NOTE — Patient Instructions (Signed)
Handouts provided on polyps, diverticulosis, hemorrhoids and gastritis.   Resume Eliquis (apixaban) at prior dose in 3 days- 08/20/19  YOU HAD AN ENDOSCOPIC PROCEDURE TODAY AT [facility]:   Refer to the procedure report that was given to you for any specific questions about what was found during the examination.  If the procedure report does not answer your questions, please call your gastroenterologist to clarify.  If you requested that your care partner not be given the details of your procedure findings, then the procedure report has been included in a sealed envelope for you to review at your convenience later.  YOU SHOULD EXPECT: Some feelings of bloating in the abdomen. Passage of more gas than usual.  Walking can help get rid of the air that was put into your GI tract during the procedure and reduce the bloating. If you had a lower endoscopy (such as a colonoscopy or flexible sigmoidoscopy) you may notice spotting of blood in your stool or on the toilet paper. If you underwent a bowel prep for your procedure, you may not have a normal bowel movement for a few days.  Please Note:  You might notice some irritation and congestion in your nose or some drainage.  This is from the oxygen used during your procedure.  There is no need for concern and it should clear up in a day or so.  SYMPTOMS TO REPORT IMMEDIATELY:   Following lower endoscopy (colonoscopy or flexible sigmoidoscopy):  Excessive amounts of blood in the stool  Significant tenderness or worsening of abdominal pains  Swelling of the abdomen that is new, acute  Fever of 100F or higher   Following upper endoscopy (EGD)  Vomiting of blood or coffee ground material  New chest pain or pain under the shoulder blades  Painful or persistently difficult swallowing  New shortness of breath  Fever of 100F or higher  Black, tarry-looking stools  For urgent or emergent issues, a gastroenterologist can be reached at any  hour by calling [phone]. Do not use MyChart messaging for urgent concerns.    DIET:  We do recommend a small meal at first, but then you may proceed to your regular diet.  Drink plenty of fluids but you should avoid alcoholic beverages for 24 hours.  ACTIVITY:  You should plan to take it easy for the rest of today and you should NOT DRIVE or use heavy machinery until tomorrow (because of the sedation medicines used during the test).    FOLLOW UP: Our staff will call the number listed on your records 48-72 hours following your procedure to check on you and address any questions or concerns that you may have regarding the information given to you following your procedure. If we do not reach you, we will leave a message.  We will attempt to reach you two times.  During this call, we will ask if you have developed any symptoms of COVID 19. If you develop any symptoms (ie: fever, flu-like symptoms, shortness of breath, cough etc.) before then, please call [phone].  If you test positive for Covid 19 in the 2 weeks post procedure, please call and report this information to [place].    If any biopsies were taken you will be contacted by phone or by letter within the next 1-3 weeks.  Please call us at [phone] if you have not heard about the biopsies in 3 weeks.    SIGNATURES/CONFIDENTIALITY: You and/or your care partner have signed paperwork which  will be entered into your electronic medical record.  These signatures attest to the fact that that the information above on your After Visit Summary has been reviewed and is understood.  Full responsibility of the confidentiality of this discharge information lies with you and/or your care-partner.

## 2019-08-17 NOTE — Progress Notes (Signed)
Pt's states no medical or surgical changes since previsit or office visit. 

## 2019-08-17 NOTE — Progress Notes (Signed)
14530 Robinul 0.1 mg IV given due large amount of secretions upon assessment.  MD made aware, vss

## 2019-08-19 ENCOUNTER — Telehealth: Payer: Self-pay | Admitting: *Deleted

## 2019-08-19 NOTE — Telephone Encounter (Signed)
°  Follow up Call-  Call back number 08/17/2019  Post procedure Call Back phone  # [phone]  Permission to leave phone message Yes  Some recent data might be hidden     Patient questions:  Do you have a fever, pain , or abdominal swelling? No. Pain Score  0 *  Have you tolerated food without any problems? Yes.    Have you been able to return to your normal activities? Yes.    Do you have any questions about your discharge instructions: Diet   No. Medications  No. Follow up visit  No.  Do you have questions or concerns about your Care? No.  Actions: * If pain score is 4 or above: 1. No action needed, pain <4.Have you developed a fever since your procedure? no  2.   Have you had an respiratory symptoms (SOB or cough) since your procedure? no  3.   Have you tested positive for COVID 19 since your procedure no  4.   Have you had any family members/close contacts diagnosed with the COVID 19 since your procedure?  no   If yes to any of these questions please route to Joylene John, RN and Erenest Rasher, RN

## 2019-08-25 ENCOUNTER — Encounter: Payer: Self-pay | Admitting: Gastroenterology

## 2019-09-01 ENCOUNTER — Encounter (INDEPENDENT_AMBULATORY_CARE_PROVIDER_SITE_OTHER): Payer: Self-pay | Admitting: Ophthalmology

## 2019-09-01 ENCOUNTER — Ambulatory Visit (INDEPENDENT_AMBULATORY_CARE_PROVIDER_SITE_OTHER): Payer: BC Managed Care – PPO | Admitting: Ophthalmology

## 2019-09-01 ENCOUNTER — Other Ambulatory Visit: Payer: Self-pay

## 2019-09-01 DIAGNOSIS — E113411 Type 2 diabetes mellitus with severe nonproliferative diabetic retinopathy with macular edema, right eye: Secondary | ICD-10-CM | POA: Diagnosis not present

## 2019-09-01 NOTE — Progress Notes (Signed)
09/01/2019     CHIEF COMPLAINT Patient presents for Retina Follow Up   HISTORY OF PRESENT ILLNESS: Bradley Holmes is a 59 y.o. male who presents to the clinic today for:   HPI    Retina Follow Up    Patient presents with  Diabetic Retinopathy.  In right eye.  Duration of 4 weeks.  Since onset it is stable.          Comments    4 week follow up - OCT OU, Poss Focal OD Patient denies change in vision and overall has no complaints. LBS 146 /// A1C 8.1       Last edited by Gerda Diss on 09/01/2019  2:48 PM. (History)      Referring physician: [name], MD [place],  [place] [id number]  HISTORICAL INFORMATION:   Selected notes from the [facility]: No current outpatient medications on file. (Ophthalmic Drugs)   No current facility-administered medications for this visit. (Ophthalmic Drugs)   Current Outpatient Medications (Other)  Medication Sig  . Alirocumab (PRALUENT) 150 MG/ML SOAJ Inject 150 mg into the skin every 14 (fourteen) days.  Marland Kitchen apixaban (ELIQUIS) 5 MG TABS tablet Take 5 mg by mouth 2 (two) times daily.  Marland Kitchen aspirin EC 81 MG tablet Take 1 tablet (81 mg total) by mouth daily. (Patient taking differently: Take 81 mg by mouth every evening. )  . BD PEN NEEDLE NANO U/F 32G X 4 MM MISC   . BYSTOLIC 10 MG tablet TAKE 1 TABLET EVERY EVENING (Patient taking differently: Take 10 mg by mouth every evening. )  . Coenzyme Q10 (CO Q-10) 200 MG CAPS Take 200 mg by mouth daily.   . dapagliflozin propanediol (FARXIGA) 10 MG TABS tablet Take 10 mg by mouth daily.  . hydrocortisone cream ([employer] CORTISONE MAXIMUM STRENGTH) 1 % Apply 1 application topically as needed for itching.  . Insulin Aspart (NOVOLOG FLEXPEN [place]) Inject 20 Units into the skin 3 (three) times daily with meals.   . nitroGLYCERIN (NITROSTAT) 0.4 MG SL tablet PLACE 1 TABLET (0.4 MG TOTAL) UNDER THE TONGUE EVERY 5 (FIVE) MINUTES AS NEEDED FOR CHEST PAIN.   Marland Kitchen omeprazole (PRILOSEC) 20 MG capsule Take 20 mg by mouth daily.  . ONE TOUCH ULTRA TEST test strip   . ONETOUCH DELICA LANCETS 87O MISC Apply 1 strip topically 3 (three) times daily.  . Potassium Chloride ER 20 MEQ TBCR Take 20 mEq by mouth 3 (three) times daily.   Marland Kitchen spironolactone (ALDACTONE) 25 MG tablet Take 25 mg by mouth every morning.  . TRESIBA FLEXTOUCH 200 UNIT/ML SOPN Inject 92 Units as directed daily.   Marland Kitchen triamcinolone cream (KENALOG) 0.1 % Apply 1 application topically 2 (two) times daily.  [name] 40-10-25 MG TABS Take 1 tablet by mouth daily.    No current facility-administered medications for this visit. (Other)      REVIEW OF SYSTEMS:    ALLERGIES Allergies  Allergen Reactions  . Norvasc [Amlodipine Besylate] Other (See Comments)    Edema  . Sulfa Antibiotics Hives    PAST MEDICAL HISTORY Past Medical History:  Diagnosis Date  . Coronary artery disease    LEXISCAN, 01/05/2011 - defect seen in Basal Inferior, Mid Inferior, and Apical Lateral region(s)-consistent with infarct/scar, global LV systolic function mildly reduced, abnormal study  . GERD (gastroesophageal reflux disease)   . Hyperlipidemia   . Hypertension    2D ECHO, 10/10/2010 -  EF >55%, normal  . Insulin dependent diabetes mellitus   . Pseudoaneurysm ([place])    LEA DOPPLER, 11/01/2010 - right groin hematoma measuring 2.47x0.62x1.61 centimeters in greatest diameter   Past Surgical History:  Procedure Laterality Date  . CARDIAC CATHETERIZATION  10/24/2010   CTI - 3030 Resolute drug-eluting stent, 3.19mm resulting reduction og a long CT to 0% residual with excellent flow.  Marland Kitchen CARDIOVERSION N/A 12/02/2017   Procedure: CARDIOVERSION;  Surgeon: Larey Dresser, MD;  [facility];  Service: Cardiovascular;  Laterality: N/A;    FAMILY HISTORY Family History  Problem Relation Age of Onset  . Heart disease Mother 6       Stent placement  . Diabetes Mother   . Diabetes Sister   .  Thyroid cancer Brother        2010  . Prostate cancer Maternal Grandfather   . Emphysema Paternal Grandfather     SOCIAL HISTORY Social History   Tobacco Use  . Smoking status: Never Smoker  . Smokeless tobacco: Never Used  Vaping Use  . Vaping Use: Never used  Substance Use Topics  . Alcohol use: Yes    Comment: occassionally  . Drug use: No         OPHTHALMIC EXAM:  Base Eye Exam    Visual Acuity (Snellen - Linear)      Right Left   Dist cc 20/20-2 20/20       Tonometry (Tonopen, 2:51 PM)      Right Left   Pressure 16 18       Pupils      Pupils Dark Light Shape React APD   Right PERRL 4 3 Round Brisk None   Left PERRL 4 3 Round Brisk None       Visual Fields (Counting fingers)      Left Right    Full Full       Extraocular Movement      Right Left    Full Full       Neuro/Psych    Oriented x3: Yes   Mood/Affect: Normal       Dilation    Both eyes: 1.0% Mydriacyl, 2.5% Phenylephrine @ 2:51 PM        Slit Lamp and Fundus Exam    External Exam      Right Left   External Normal Normal       Slit Lamp Exam      Right Left   Lids/Lashes Normal Normal   Conjunctiva/Sclera White and quiet White and quiet   Cornea Clear Clear   Anterior Chamber Deep and quiet Deep and quiet   Iris Round and reactive Round and reactive   Vitreous Normal Normal          IMAGING AND PROCEDURES  Imaging and Procedures for 09/01/19  OCT, Retina - OU - Both Eyes       Right Eye Quality was good. Scan locations included subfoveal. Central Foveal Thickness: 272.   Left Eye Scan locations included subfoveal. Central Foveal Thickness: 272.   Notes CSME focal temporal to the fovea OD for focal laser OD today       Focal Laser - OD - Right Eye       Time Out Confirmed correct patient, procedure, site, and patient consented.   Anesthesia Topical anesthesia was used. Anesthetic medications included Proparacaine 0.5%.   Laser Information The  type of laser was diode. Color was yellow. The duration in seconds was 0.05. The spot  size was 100 microns. Laser power was 80. Total spots was 62.   Post-op The patient tolerated the procedure well. There were no complications.                 ASSESSMENT/PLAN:  Severe nonproliferative diabetic retinopathy of right eye, with macular edema, associated with type 2 diabetes mellitus ([place]) Focal laser photocoagulation today delivered temporal to the fovea no complications      [id number]   1. Severe nonproliferative diabetic retinopathy of right eye, with macular edema, associated with type 2 diabetes mellitus (HCC)  E11.3411 OCT, Retina - OU - Both Eyes    Focal Laser - OD - Right Eye    1.  2.  3.  Ophthalmic Meds Ordered this visit:  No orders of the defined types were placed in this encounter.      Return in about 4 months (around 01/01/2020) for DILATE OU, OCT.  There are no Patient Instructions on file for this visit.   Explained the diagnoses, plan, and follow up with the patient and they expressed understanding.  Patient expressed understanding of the importance of proper follow up care.   Clent Demark Nazifa Trinka M.D. Diseases & Surgery of the Retina and Vitreous Retina [facility] Diabetic [place] 09/01/19     Abbreviations: M myopia (nearsighted); A astigmatism; H hyperopia (farsighted); P presbyopia; Mrx spectacle prescription;  CTL contact lenses; OD right eye; OS left eye; OU both eyes  XT exotropia; ET esotropia; PEK punctate epithelial keratitis; PEE punctate epithelial erosions; DES dry eye syndrome; MGD meibomian gland dysfunction; ATs artificial tears; PFAT's preservative free artificial tears; [place] nuclear sclerotic cataract; PSC posterior subcapsular cataract; ERM epi-retinal membrane; PVD posterior vitreous detachment; RD retinal detachment; DM diabetes mellitus; DR diabetic retinopathy; NPDR non-proliferative diabetic retinopathy; PDR proliferative diabetic  retinopathy; CSME clinically significant macular edema; DME diabetic macular edema; dbh dot blot hemorrhages; CWS cotton wool spot; POAG primary open angle glaucoma; C/D cup-to-disc ratio; HVF humphrey visual field; GVF goldmann visual field; OCT optical coherence tomography; IOP intraocular pressure; BRVO Branch retinal vein occlusion; CRVO central retinal vein occlusion; CRAO central retinal artery occlusion; BRAO branch retinal artery occlusion; RT retinal tear; SB scleral buckle; PPV pars plana vitrectomy; VH Vitreous hemorrhage; PRP panretinal laser photocoagulation; IVK intravitreal kenalog; VMT vitreomacular traction; MH Macular hole;  NVD neovascularization of the disc; NVE neovascularization elsewhere; AREDS age related eye disease study; ARMD age related macular degeneration; POAG primary open angle glaucoma; EBMD epithelial/anterior basement membrane dystrophy; ACIOL anterior chamber intraocular lens; IOL intraocular lens; PCIOL posterior chamber intraocular lens; Phaco/IOL phacoemulsification with intraocular lens placement; [place] photorefractive keratectomy; LASIK laser assisted in situ keratomileusis; HTN hypertension; DM diabetes mellitus; COPD chronic obstructive pulmonary disease

## 2019-09-01 NOTE — Assessment & Plan Note (Signed)
Focal laser photocoagulation today delivered temporal to the fovea no complications

## 2019-09-07 ENCOUNTER — Encounter: Payer: Self-pay | Admitting: Physician Assistant

## 2019-09-07 ENCOUNTER — Ambulatory Visit: Payer: BC Managed Care – PPO | Admitting: Physician Assistant

## 2019-09-07 VITALS — BP 94/70 | HR 68 | Ht 66.0 in | Wt 264.1 lb

## 2019-09-07 DIAGNOSIS — R194 Change in bowel habit: Secondary | ICD-10-CM

## 2019-09-07 DIAGNOSIS — R1013 Epigastric pain: Secondary | ICD-10-CM

## 2019-09-07 NOTE — Progress Notes (Signed)
Chief Complaint: Follow-up epigastric pain  HPI:    Bradley Holmes is a 59 year old male with a past medical history as listed below, who was referred to me by Bradley Solian, MD for a complaint of follow-up for epigastric pain.        02/10/2014 colonoscopy Dr. Olevia Holmes with 3 polyps and diverticulosis.  Pathology showed tubular adenoma and repeat was recommended in 5 years.    06/02/2019 BMP normal    06/24/2019 office visit with PCP for upper abdominal pain.  At that time ordered a CT as below and also increased omeprazole to twice daily.  They recommended a possible EGD and colonoscopy.    06/26/2019 CT abdomen pelvis without IV contrast was normal.    07/14/2019 patient seen in clinic and described it for 6 months he had epigastric/left upper quadrant pain which "hurt when I push on it".  Also reported history of polyps.  Discussed that his CTAP without contrast was normal, labs are normal, patient was tender over his rib cage and was thought this pain is possibly musculoskeletal.  He was scheduled for an EGD and colonoscopy with Bradley Holmes.  Also continued on omeprazole 20 twice daily.    08/17/2019 colonoscopy with 3 5-8 mm polyps at the [facility] flexure and the ascending colon, one 11 mm polyp in the transverse colon, 3 1-2 mm polyps in the rectum and the sigmoid colon as well as the transverse colon, diverticulosis in the sigmoid and descending colon, nonbleeding external and internal hemorrhoids.  Repeat was recommended in 3 years.  EGD with irregular Z-line, no gross lesions in the esophagus, excessive gastric fluid, a single gastric polyp, gastritis, erythematous to adenopathy and normal second portion of the duodenum.  Recommend he have a gastric emptying scan to exclude gastroparesis.  Pathology showed tubular adenomas.  Also mild chronic gastritis.    Today, the patient presents to clinic and tells me that since having his procedures done he has a slight change in bowel habits, he is now going  twice a day one before he typically went once a day but it is still solid and formed with no real abdominal pain.    In regards to his epigastric pain, he feels some better recently and this pain is not occurring as often.  He continues on Omeprazole 20 mg twice daily.    Denies fever, chills, weight loss, blood in his stool or symptoms that awaken him from sleep.  Past Medical History:  Diagnosis Date  . Coronary artery disease    LEXISCAN, 01/05/2011 - defect seen in Basal Inferior, Mid Inferior, and Apical Lateral region(s)-consistent with infarct/scar, global LV systolic function mildly reduced, abnormal study  . GERD (gastroesophageal reflux disease)   . Hyperlipidemia   . Hypertension    2D ECHO, 10/10/2010 - EF >55%, normal  . Insulin dependent diabetes mellitus   . Pseudoaneurysm ([place])    LEA DOPPLER, 11/01/2010 - right groin hematoma measuring 2.47x0.62x1.61 centimeters in greatest diameter    Past Surgical History:  Procedure Laterality Date  . CARDIAC CATHETERIZATION  10/24/2010   CTI - 3030 Resolute drug-eluting stent, 3.68mm resulting reduction og a long CT to 0% residual with excellent flow.  Marland Kitchen CARDIOVERSION N/A 12/02/2017   Procedure: CARDIOVERSION;  Surgeon: Bradley Dresser, MD;  Location: [facility] ENDOSCOPY;  Service: Cardiovascular;  Laterality: N/A;    Current Outpatient Medications  Medication Sig Dispense Refill  . Alirocumab (PRALUENT) 150 MG/ML SOAJ Inject 150 mg into the skin every 14 (fourteen) days.  2 pen 12  . apixaban (ELIQUIS) 5 MG TABS tablet Take 5 mg by mouth 2 (two) times daily.    Marland Kitchen aspirin EC 81 MG tablet Take 1 tablet (81 mg total) by mouth daily. (Patient taking differently: Take 81 mg by mouth every evening. ) 90 tablet 3  . BD PEN NEEDLE NANO U/F 32G X 4 MM MISC     . BYSTOLIC 10 MG tablet TAKE 1 TABLET EVERY EVENING (Patient taking differently: Take 10 mg by mouth every evening. ) 30 tablet 2  . Coenzyme Q10 (CO Q-10) 200 MG CAPS Take 200 mg by mouth  daily.     . dapagliflozin propanediol (FARXIGA) 10 MG TABS tablet Take 10 mg by mouth daily.    . hydrocortisone cream ([employer]S CORTISONE MAXIMUM STRENGTH) 1 % Apply 1 application topically as needed for itching.    . Insulin Aspart (NOVOLOG FLEXPEN [place]) Inject 20 Units into the skin 3 (three) times daily with meals.     . nitroGLYCERIN (NITROSTAT) 0.4 MG SL tablet PLACE 1 TABLET (0.4 MG TOTAL) UNDER THE TONGUE EVERY 5 (FIVE) MINUTES AS NEEDED FOR CHEST PAIN. 25 tablet 3  . omeprazole (PRILOSEC) 20 MG capsule Take 20 mg by mouth daily.    . ONE TOUCH ULTRA TEST test strip     . ONETOUCH DELICA LANCETS 32R MISC Apply 1 strip topically 3 (three) times daily.  8  . Potassium Chloride ER 20 MEQ TBCR Take 20 mEq by mouth 3 (three) times daily.     Marland Kitchen spironolactone (ALDACTONE) 25 MG tablet Take 25 mg by mouth every morning.    . TRESIBA FLEXTOUCH 200 UNIT/ML SOPN Inject 92 Units as directed daily.   6  . triamcinolone cream (KENALOG) 0.1 % Apply 1 application topically 2 (two) times daily.    Bradley Holmes 40-10-25 MG TABS Take 1 tablet by mouth daily.      No current facility-administered medications for this visit.    Allergies as of 09/07/2019 - Review Complete 09/01/2019  Allergen Reaction Noted  . Norvasc [amlodipine besylate] Other (See Comments) 08/08/2012  . Sulfa antibiotics Hives 08/08/2012    Family History  Problem Relation Age of Onset  . Heart disease Mother 50       Stent placement  . Diabetes Mother   . Diabetes Sister   . Thyroid cancer Brother        2010  . Prostate cancer Maternal Grandfather   . Emphysema Paternal Grandfather     Social History   Socioeconomic History  . Marital status: Single    Spouse name: Not on file  . Number of children: Not on file  . Years of education: Not on file  . Highest education level: Not on file  Occupational History  . Not on file  Tobacco Use  . Smoking status: Never Smoker  . Smokeless tobacco: Never Used  Vaping Use  .  Vaping Use: Never used  Substance and Sexual Activity  . Alcohol use: Yes    Comment: occassionally  . Drug use: No  . Sexual activity: Not on file  Other Topics Concern  . Not on file  Social History Narrative  . Not on file   Social Determinants of Health   Financial Resource Strain:   . Difficulty of Paying Living Expenses:   Food Insecurity:   . Worried About Charity fundraiser in the Last Year:   . Arboriculturist in the Last Year:   [employer]  Needs:   . Lack of Transportation (Medical):   Marland Kitchen Lack of Transportation (Non-Medical):   Physical Activity:   . Days of Exercise per Week:   . Minutes of Exercise per Session:   Stress:   . Feeling of Stress :   Social Connections:   . Frequency of Communication with Friends and Family:   . Frequency of Social Gatherings with Friends and Family:   . Attends Religious Services:   . Active Member of Clubs or Organizations:   . Attends Archivist Meetings:   Marland Kitchen Marital Status:   Intimate Partner Violence:   . Fear of Current or Ex-Partner:   . Emotionally Abused:   Marland Kitchen Physically Abused:   . Sexually Abused:     Review of Systems:    Constitutional: No weight loss, fever or chills Cardiovascular: No chest pain Respiratory: No SOB Gastrointestinal: See HPI and otherwise negative   Physical Exam:  Vital signs: BP 94/70 (BP Location: Left Arm, Patient Position: Supine, Cuff Size: Normal)   Pulse 68   Ht 5\' 6"  (1.676 m) Comment: height measured without shoes  Wt 264 lb 2 oz (119.8 kg)   BMI 42.63 kg/m   Constitutional:   Pleasant obese Caucasian male appears to be in NAD, Well developed, Well nourished, alert and cooperative Respiratory: Respirations even and unlabored. Lungs clear to auscultation bilaterally.   No wheezes, crackles, or rhonchi.  Cardiovascular: Normal S1, S2. No MRG. Regular rate and rhythm. No peripheral edema, cyanosis or pallor.  Gastrointestinal:  Soft, nondistended, mild epigastric ttp.  No rebound or guarding. Normal bowel sounds. No appreciable masses or hepatomegaly. MSK: pain over left lower rib cage with palpation Rectal:  Not performed.  Psychiatric:  Demonstrates good judgement and reason without abnormal affect or behaviors.  No recent labs.  Assessment: 1.  Epigastric pain: Continues, but improved per patient, tender over the rib cage; consider gastroparesis as per Dr. Woodward Ku recs vs musculoskeletal vs known chronic gastritis 2.  Change in bowel habits: Slight increase in frequency since colonoscopy, but still soft and solid; consider just from a change in flora after procedure  Plan: 1.  Discussed with patient I am not concerned about his slight increase in bowel movements as long as they are still soft and solid and there is no abdominal pain associated with them. 2.  Scheduled patient for gastric emptying study for further evaluation of epigastric pain per recommendations from Bradley Holmes. 3.  If above is negative/normal then would recommend patient continue on Omeprazole 20 mg twice daily.  There is still some concern that this may be musculoskeletal, the patient is tender over the rib cage.  Would like to see how he does over the next couple of months. 4.  Patient to follow in clinic with [place] per recommendations after testing above.  Ellouise Newer, PA-C [facility] 09/07/2019, 1:57 PM  Cc: Bradley Solian, MD

## 2019-09-07 NOTE — Patient Instructions (Signed)
If you are age 59 or older, your body mass index should be between 23-30. Your Body mass index is 42.63 kg/m. If this is out of the aforementioned range listed, please consider follow up with your Primary Care Provider.  If you are age 60 or younger, your body mass index should be between 19-25. Your Body mass index is 42.63 kg/m. If this is out of the aformentioned range listed, please consider follow up with your Primary Care Provider.   You have been scheduled for a gastric emptying scan at [facility] on Monday 09/28/19 at 7:30 am. Please arrive at least 15 minutes prior to your appointment for registration. Please make certain not to have anything to eat or drink after midnight the night before your test. Hold all stomach medications (ex: Zofran, phenergan, Reglan) 48 hours prior to your test. If you need to reschedule your appointment, please contact radiology scheduling at [phone]. _________________________________________________________________  A gastric-emptying study measures how long it takes for food to move through your stomach. There are several ways to measure stomach emptying. In the most common test, you eat food that contains a small amount of radioactive material. A scanner that detects the movement of the radioactive material is placed over your abdomen to monitor the rate at which food leaves your stomach. This test normally takes about 4 hours to complete. ________________________________________________________________

## 2019-09-10 NOTE — Progress Notes (Signed)
Reviewed and agree with documentation and assessment and plan. K. Veena Tamyra Fojtik , MD   

## 2019-09-28 ENCOUNTER — Other Ambulatory Visit: Payer: Self-pay

## 2019-09-28 ENCOUNTER — Ambulatory Visit (HOSPITAL_COMMUNITY)
Admission: RE | Admit: 2019-09-28 | Discharge: 2019-09-28 | Disposition: A | Discharge status: Home / Self Care | Payer: BC Managed Care – PPO | Source: Ambulatory Visit | Attending: Physician Assistant | Admitting: Physician Assistant

## 2019-09-28 DIAGNOSIS — R1013 Epigastric pain: Secondary | ICD-10-CM | POA: Diagnosis present

## 2019-09-28 DIAGNOSIS — R194 Change in bowel habit: Secondary | ICD-10-CM | POA: Diagnosis present

## 2019-09-28 MED ORDER — TECHNETIUM TC 99M SULFUR COLLOID
2.0000 | Freq: Once | INTRAVENOUS | Status: AC | PRN
  Administered 2019-09-28: 2 via INTRAVENOUS

## 2019-09-30 ENCOUNTER — Encounter: Payer: Self-pay | Admitting: Adult Health

## 2019-09-30 ENCOUNTER — Ambulatory Visit: Payer: BC Managed Care – PPO | Admitting: Adult Health

## 2019-09-30 VITALS — BP 98/49 | HR 48 | Ht 66.0 in | Wt 265.0 lb

## 2019-09-30 DIAGNOSIS — G4733 Obstructive sleep apnea (adult) (pediatric): Secondary | ICD-10-CM

## 2019-09-30 DIAGNOSIS — Z9989 Dependence on other enabling machines and devices: Secondary | ICD-10-CM | POA: Diagnosis not present

## 2019-09-30 NOTE — Progress Notes (Signed)
PATIENT: Bradley Holmes DOB: May 13, 1960  REASON FOR VISIT: follow up HISTORY FROM: patient  HISTORY OF PRESENT ILLNESS: Today 09/30/19:  Bradley Holmes is a 59 year old male with a history of obstructive sleep apnea on CPAP.  His download indicates that he uses machine nightly for compliance of 100%.  He uses machine greater than 4 hours each night.  On average he uses his machine 6 hours and 2 minutes.  His residual AHI is 1.8 on 12 cm of water with EPR 1.  Leak in the 95th percentile is 34.9 L/min.  He reports that the CPAP is working well for him.  He denies any new issues.  He returns today for an evaluation.  HISTORY  09/29/2018: I reviewed his CPAP compliance data from 08/26/2018 through 09/24/2018 which is a total of 30 days, during which time he used his CPAP every night with percent used days greater than 4 hours at 97%, indicating excellent compliance with an average usage of 6 hours and 15 minutes, residual AHI at goal at 1.1/h, leak on the higher side with a 95th percentile at 28.5 L/min on a pressure of 12 cm.    He reports Being compliant with his CPAP, he has no new issues with this, uses a nasal mask.  He has had some intermittent shortness of breath, had a cardiology checkup earlier this month, was noted to have PVCs and was referred to EP, has an appointment with Dr. Crissie Sickles pending next week.  He also did a 3-day heart monitor which he mailed back About a week ago, he has not heard about the results.He denies any chest pain, he currently denies any shortness of breath, feels a little anxious about the irregularities in his heart rate. He Has a blood pressure monitor and a pulse ox at home and had low readings at home, but When he counted his pulse it was higher.  He noticed some irregularities in his own pulse rate. Of note, since his last cardiology appointment with the PA he has completely discontinued his soda intake.  He would drink up to 2 L of soda per day and has  abruptly stopped it, he tries to hydrate well with water.  The patient's allergies, current medications, family history, past medical history, past social history, past surgical history and problem list were reviewed and updated as appropriate.   REVIEW OF SYSTEMS: Out of a complete 14 system review of symptoms, the patient complains only of the following symptoms, and all other reviewed systems are negative.  FSS 43 ESS 10  ALLERGIES: Allergies  Allergen Reactions  . Norvasc [Amlodipine Besylate] Other (See Comments)    Edema  . Sulfa Antibiotics Hives    HOME MEDICATIONS: Outpatient Medications Prior to Visit  Medication Sig Dispense Refill  . Alirocumab (PRALUENT) 150 MG/ML SOAJ Inject 150 mg into the skin every 14 (fourteen) days. 2 pen 12  . apixaban (ELIQUIS) 5 MG TABS tablet Take 5 mg by mouth 2 (two) times daily.    Marland Kitchen aspirin EC 81 MG tablet Take 1 tablet (81 mg total) by mouth daily. (Patient taking differently: Take 81 mg by mouth every evening. ) 90 tablet 3  . BD PEN NEEDLE NANO U/F 32G X 4 MM MISC     . BYSTOLIC 10 MG tablet TAKE 1 TABLET EVERY EVENING (Patient taking differently: Take 10 mg by mouth every evening. ) 30 tablet 2  . Coenzyme Q10 (CO Q-10) 200 MG CAPS Take 200 mg by  mouth daily.     . dapagliflozin propanediol (FARXIGA) 10 MG TABS tablet Take 10 mg by mouth daily.    . hydrocortisone cream ([employer] CORTISONE MAXIMUM STRENGTH) 1 % Apply 1 application topically as needed for itching.    . Insulin Aspart (NOVOLOG FLEXPEN [place]) Inject 20 Units into the skin 3 (three) times daily with meals. Pt takes 25 units    . nitroGLYCERIN (NITROSTAT) 0.4 MG SL tablet PLACE 1 TABLET (0.4 MG TOTAL) UNDER THE TONGUE EVERY 5 (FIVE) MINUTES AS NEEDED FOR CHEST PAIN. 25 tablet 3  . omeprazole (PRILOSEC) 20 MG capsule Take 20 mg by mouth daily.    . ONE TOUCH ULTRA TEST test strip     . ONETOUCH DELICA LANCETS 17O MISC Apply 1 strip topically 3 (three) times daily.  8  . Potassium  Chloride ER 20 MEQ TBCR Take 20 mEq by mouth 3 (three) times daily.     Marland Kitchen spironolactone (ALDACTONE) 25 MG tablet Take 25 mg by mouth every morning.    . TRESIBA FLEXTOUCH 200 UNIT/ML SOPN Inject 92 Units as directed daily. 140 units  6  . triamcinolone cream (KENALOG) 0.1 % Apply 1 application topically 2 (two) times daily.    [name] 40-10-25 MG TABS Take 1 tablet by mouth daily.      No facility-administered medications prior to visit.    PAST MEDICAL HISTORY: Past Medical History:  Diagnosis Date  . Adenomatous colon polyp   . Coronary artery disease    LEXISCAN, 01/05/2011 - defect seen in Basal Inferior, Mid Inferior, and Apical Lateral region(s)-consistent with infarct/scar, global LV systolic function mildly reduced, abnormal study  . Gastroparesis   . GERD (gastroesophageal reflux disease)   . Hyperlipidemia   . Hypertension    2D ECHO, 10/10/2010 - EF >55%, normal  . Insulin dependent diabetes mellitus   . Pseudoaneurysm ([place])    LEA DOPPLER, 11/01/2010 - right groin hematoma measuring 2.47x0.62x1.61 centimeters in greatest diameter    PAST SURGICAL HISTORY: Past Surgical History:  Procedure Laterality Date  . CARDIAC CATHETERIZATION  10/24/2010   CTI - 3030 Resolute drug-eluting stent, 3.24mm resulting reduction og a long CT to 0% residual with excellent flow.  Marland Kitchen CARDIOVERSION N/A 12/02/2017   Procedure: CARDIOVERSION;  Surgeon: Larey Dresser, MD;  Location: [facility]COPY;  Service: Cardiovascular;  Laterality: N/A;    FAMILY HISTORY: Family History  Problem Relation Age of Onset  . Heart disease Mother 66       Stent placement  . Diabetes Mother   . Diabetes Sister   . Thyroid cancer Brother        2010  . Prostate cancer Maternal Grandfather   . Emphysema Paternal Grandfather     SOCIAL HISTORY: Social History   Socioeconomic History  . Marital status: Single    Spouse name: Not on file  . Number of children: Not on file  . Years of education: Not  on file  . Highest education level: Not on file  Occupational History  . Not on file  Tobacco Use  . Smoking status: Never Smoker  . Smokeless tobacco: Never Used  Vaping Use  . Vaping Use: Never used  Substance and Sexual Activity  . Alcohol use: Yes    Comment: occassionally  . Drug use: No  . Sexual activity: Not on file  Other Topics Concern  . Not on file  Social History Narrative  . Not on file   Social Determinants of Health  Financial Resource Strain:   . Difficulty of Paying Living Expenses:   Food Insecurity:   . Worried About Charity fundraiser in the Last Year:   . Arboriculturist in the Last Year:   Transportation Needs:   . Film/video editor (Medical):   Marland Kitchen Lack of Transportation (Non-Medical):   Physical Activity:   . Days of Exercise per Week:   . Minutes of Exercise per Session:   Stress:   . Feeling of Stress :   Social Connections:   . Frequency of Communication with Friends and Family:   . Frequency of Social Gatherings with Friends and Family:   . Attends Religious Services:   . Active Member of Clubs or Organizations:   . Attends Archivist Meetings:   Marland Kitchen Marital Status:   Intimate Partner Violence:   . Fear of Current or Ex-Partner:   . Emotionally Abused:   Marland Kitchen Physically Abused:   . Sexually Abused:       PHYSICAL EXAM  Vitals:   09/30/19 1500  BP: (!) 98/49  Pulse: (!) 48  Weight: 265 lb (120.2 kg)  Height: 5\' 6"  (1.676 m)   Body mass index is 42.77 kg/m.  Generalized: Well developed, in no acute distress  Chest: Lungs clear to auscultation bilaterally  Neurological examination  Mentation: Alert oriented to time, place, history taking. Follows all commands speech and language fluent Cranial nerve II-XII: Extraocular movements were full, visual field were full on confrontational test Head turning and shoulder shrug  were normal and symmetric. Motor: The motor testing reveals 5 over 5 strength of all 4  extremities. Good symmetric motor tone is noted throughout.  Sensory: Sensory testing is intact to soft touch on all 4 extremities. No evidence of extinction is noted.  Gait and station: Gait is normal.    DIAGNOSTIC DATA (LABS, IMAGING, TESTING) - I reviewed patient records, labs, notes, testing and imaging myself where available.  Lab Results  Component Value Date   WBC 7.1 11/27/2017   HGB 15.4 11/27/2017   HCT 45.1 11/27/2017   MCV 89 11/27/2017   PLT 181 11/27/2017      Component Value Date/Time   NA 141 11/27/2017 0830   K 3.8 11/27/2017 0830   CL 102 11/27/2017 0830   CO2 20 11/27/2017 0830   GLUCOSE 238 (H) 11/27/2017 0830   GLUCOSE 199 (H) 03/24/2014 1441   BUN 25 (H) 11/27/2017 0830   CREATININE 1.29 (H) 11/27/2017 0830   CALCIUM 9.2 11/27/2017 0830   PROT 7.4 11/01/2010 0124   ALBUMIN 4.2 11/01/2010 0124   AST 26 11/01/2010 0124   ALT 37 11/01/2010 0124   ALKPHOS 107 11/01/2010 0124   BILITOT 0.3 11/01/2010 0124   GFRNONAA 62 11/27/2017 0830   GFRAA 71 11/27/2017 0830      ASSESSMENT AND PLAN 59 y.o. year old male  has a past medical history of Adenomatous colon polyp, Coronary artery disease, Gastroparesis, GERD (gastroesophageal reflux disease), Hyperlipidemia, Hypertension, Insulin dependent diabetes mellitus, and Pseudoaneurysm ([place]). here with:  1. OSA on CPAP  - CPAP compliance excellent - Good treatment of AHI  - Encourage patient to use CPAP nightly and > 4 hours each night - F/U in 1 year or sooner if needed   I spent 20 minutes of face-to-face and non-face-to-face time with patient.  This included previsit chart review, lab review, study review, order entry, electronic health record documentation, patient education.  Ward Givens, MSN, NP-C 09/30/2019, 3:06  PM [facility] [address], [place], [place] [address] [phone]

## 2019-10-21 ENCOUNTER — Ambulatory Visit: Payer: BC Managed Care – PPO | Admitting: Gastroenterology

## 2019-10-21 ENCOUNTER — Other Ambulatory Visit: Payer: Self-pay | Admitting: Cardiovascular Disease

## 2019-11-28 ENCOUNTER — Other Ambulatory Visit: Payer: Self-pay | Admitting: Cardiovascular Disease

## 2019-12-04 ENCOUNTER — Emergency Department (HOSPITAL_COMMUNITY)
Admission: EM | Admit: 2019-12-04 | Discharge: 2019-12-05 | Disposition: A | Discharge status: Home / Self Care | Payer: BC Managed Care – PPO | Attending: Emergency Medicine | Admitting: Emergency Medicine

## 2019-12-04 ENCOUNTER — Encounter (HOSPITAL_COMMUNITY): Payer: Self-pay | Admitting: Emergency Medicine

## 2019-12-04 ENCOUNTER — Other Ambulatory Visit: Payer: Self-pay

## 2019-12-04 DIAGNOSIS — Z79899 Other long term (current) drug therapy: Secondary | ICD-10-CM | POA: Diagnosis not present

## 2019-12-04 DIAGNOSIS — I10 Essential (primary) hypertension: Secondary | ICD-10-CM | POA: Diagnosis not present

## 2019-12-04 DIAGNOSIS — N179 Acute kidney failure, unspecified: Secondary | ICD-10-CM | POA: Insufficient documentation

## 2019-12-04 DIAGNOSIS — E876 Hypokalemia: Secondary | ICD-10-CM | POA: Insufficient documentation

## 2019-12-04 DIAGNOSIS — N1832 Chronic kidney disease, stage 3b: Secondary | ICD-10-CM | POA: Diagnosis present

## 2019-12-04 DIAGNOSIS — Z20822 Contact with and (suspected) exposure to covid-19: Secondary | ICD-10-CM | POA: Insufficient documentation

## 2019-12-04 DIAGNOSIS — I4819 Other persistent atrial fibrillation: Secondary | ICD-10-CM | POA: Diagnosis present

## 2019-12-04 DIAGNOSIS — E119 Type 2 diabetes mellitus without complications: Secondary | ICD-10-CM | POA: Insufficient documentation

## 2019-12-04 DIAGNOSIS — G4733 Obstructive sleep apnea (adult) (pediatric): Secondary | ICD-10-CM | POA: Diagnosis present

## 2019-12-04 DIAGNOSIS — E875 Hyperkalemia: Secondary | ICD-10-CM

## 2019-12-04 DIAGNOSIS — E785 Hyperlipidemia, unspecified: Secondary | ICD-10-CM | POA: Diagnosis present

## 2019-12-04 DIAGNOSIS — Z794 Long term (current) use of insulin: Secondary | ICD-10-CM | POA: Diagnosis not present

## 2019-12-04 LAB — COMPREHENSIVE METABOLIC PANEL
ALT: 56 U/L — ABNORMAL HIGH (ref 0–44)
AST: 45 U/L — ABNORMAL HIGH (ref 15–41)
Albumin: 4.3 g/dL (ref 3.5–5.0)
Alkaline Phosphatase: 63 U/L (ref 38–126)
Anion gap: 13 (ref 5–15)
BUN: 64 mg/dL — ABNORMAL HIGH (ref 6–20)
CO2: 19 mmol/L — ABNORMAL LOW (ref 22–32)
Calcium: 9.4 mg/dL (ref 8.9–10.3)
Chloride: 102 mmol/L (ref 98–111)
Creatinine, Ser: 2.14 mg/dL — ABNORMAL HIGH (ref 0.61–1.24)
GFR calc Af Amer: 38 mL/min — ABNORMAL LOW (ref >60)
GFR calc non Af Amer: 33 mL/min — ABNORMAL LOW (ref >60)
Glucose, Bld: 102 mg/dL — ABNORMAL HIGH (ref 70–99)
Potassium: 6.3 mmol/L (ref 3.5–5.1)
Sodium: 134 mmol/L — ABNORMAL LOW (ref 135–145)
Total Bilirubin: 1 mg/dL (ref 0.3–1.2)
Total Protein: 7.4 g/dL (ref 6.5–8.1)

## 2019-12-04 LAB — CBC WITH DIFFERENTIAL/PLATELET
Abs Immature Granulocytes: 0.03 10*3/uL (ref 0.00–0.07)
Basophils Absolute: 0 10*3/uL (ref 0.0–0.1)
Basophils Relative: 0 %
Eosinophils Absolute: 0 10*3/uL (ref 0.0–0.5)
Eosinophils Relative: 0 %
HCT: 49.4 % (ref 39.0–52.0)
Hemoglobin: 15.6 g/dL (ref 13.0–17.0)
Immature Granulocytes: 0 %
Lymphocytes Relative: 29 %
Lymphs Abs: 2.3 10*3/uL (ref 0.7–4.0)
MCH: 30.2 pg (ref 26.0–34.0)
MCHC: 31.6 g/dL (ref 30.0–36.0)
MCV: 95.7 fL (ref 80.0–100.0)
Monocytes Absolute: 1.1 10*3/uL — ABNORMAL HIGH (ref 0.1–1.0)
Monocytes Relative: 14 %
Neutro Abs: 4.4 10*3/uL (ref 1.7–7.7)
Neutrophils Relative %: 57 %
Platelets: 187 10*3/uL (ref 150–400)
RBC: 5.16 MIL/uL (ref 4.22–5.81)
RDW: 12.3 % (ref 11.5–15.5)
WBC: 7.8 10*3/uL (ref 4.0–10.5)
nRBC: 0 % (ref 0.0–0.2)

## 2019-12-04 NOTE — ED Triage Notes (Signed)
Pt sent by pcp for high potassium to be check.

## 2019-12-05 ENCOUNTER — Encounter (HOSPITAL_COMMUNITY): Payer: Self-pay | Admitting: Internal Medicine

## 2019-12-05 DIAGNOSIS — N1832 Chronic kidney disease, stage 3b: Secondary | ICD-10-CM | POA: Diagnosis present

## 2019-12-05 DIAGNOSIS — E875 Hyperkalemia: Secondary | ICD-10-CM | POA: Diagnosis present

## 2019-12-05 LAB — BASIC METABOLIC PANEL
Anion gap: 11 (ref 5–15)
BUN: 57 mg/dL — ABNORMAL HIGH (ref 6–20)
CO2: 21 mmol/L — ABNORMAL LOW (ref 22–32)
Calcium: 9.5 mg/dL (ref 8.9–10.3)
Chloride: 102 mmol/L (ref 98–111)
Creatinine, Ser: 1.89 mg/dL — ABNORMAL HIGH (ref 0.61–1.24)
GFR calc Af Amer: 44 mL/min — ABNORMAL LOW (ref >60)
GFR calc non Af Amer: 38 mL/min — ABNORMAL LOW (ref >60)
Glucose, Bld: 133 mg/dL — ABNORMAL HIGH (ref 70–99)
Potassium: 5.3 mmol/L — ABNORMAL HIGH (ref 3.5–5.1)
Sodium: 134 mmol/L — ABNORMAL LOW (ref 135–145)

## 2019-12-05 LAB — RESPIRATORY PANEL BY RT PCR (FLU A&B, COVID)
Influenza A by PCR: NEGATIVE
Influenza B by PCR: NEGATIVE
SARS Coronavirus 2 by RT PCR: NEGATIVE

## 2019-12-05 LAB — HIV ANTIBODY (ROUTINE TESTING W REFLEX): HIV Screen 4th Generation wRfx: NONREACTIVE

## 2019-12-05 LAB — CBG MONITORING, ED: Glucose-Capillary: 121 mg/dL — ABNORMAL HIGH (ref 70–99)

## 2019-12-05 MED ORDER — ACETAMINOPHEN 650 MG RE SUPP: 650.0000 mg | Freq: Four times a day (QID) | RECTAL | Status: DC | PRN

## 2019-12-05 MED ORDER — CALCIUM GLUCONATE-NACL 1-0.675 GM/50ML-% IV SOLN
1.0000 g | Freq: Once | INTRAVENOUS | Status: AC
  Administered 2019-12-05: 1000 mg via INTRAVENOUS
  Filled 2019-12-05: qty 50

## 2019-12-05 MED ORDER — ASPIRIN EC 81 MG PO TBEC: 81.0000 mg | DELAYED_RELEASE_TABLET | Freq: Every evening | ORAL | Status: DC

## 2019-12-05 MED ORDER — NEBIVOLOL HCL 5 MG PO TABS
5.0000 mg | ORAL_TABLET | Freq: Every evening | ORAL | Status: DC
  Filled 2019-12-05: qty 1

## 2019-12-05 MED ORDER — INSULIN ASPART 100 UNIT/ML ~~LOC~~ SOLN
0.0000 [IU] | Freq: Three times a day (TID) | SUBCUTANEOUS | Status: DC
  Administered 2019-12-05: 3 [IU] via SUBCUTANEOUS

## 2019-12-05 MED ORDER — ACETAMINOPHEN 325 MG PO TABS: 650.0000 mg | ORAL_TABLET | Freq: Four times a day (QID) | ORAL | Status: DC | PRN

## 2019-12-05 MED ORDER — APIXABAN 5 MG PO TABS
5.0000 mg | ORAL_TABLET | Freq: Two times a day (BID) | ORAL | Status: DC
  Administered 2019-12-05: 5 mg via ORAL
  Filled 2019-12-05: qty 1

## 2019-12-05 MED ORDER — PANTOPRAZOLE SODIUM 40 MG PO TBEC
40.0000 mg | DELAYED_RELEASE_TABLET | Freq: Every day | ORAL | Status: DC
  Administered 2019-12-05: 40 mg via ORAL
  Filled 2019-12-05: qty 1

## 2019-12-05 MED ORDER — SODIUM ZIRCONIUM CYCLOSILICATE 10 G PO PACK
10.0000 g | PACK | Freq: Once | ORAL | Status: AC
  Administered 2019-12-05: 10 g via ORAL
  Filled 2019-12-05: qty 1

## 2019-12-05 MED ORDER — AMLODIPINE BESYLATE 5 MG PO TABS
5.0000 mg | ORAL_TABLET | Freq: Every day | ORAL | Status: DC
  Administered 2019-12-05: 5 mg via ORAL
  Filled 2019-12-05: qty 1

## 2019-12-05 MED ORDER — INSULIN ASPART 100 UNIT/ML ~~LOC~~ SOLN: 0.0000 [IU] | Freq: Every day | SUBCUTANEOUS | Status: DC

## 2019-12-05 MED ORDER — ONDANSETRON HCL 4 MG/2ML IJ SOLN: 4.0000 mg | Freq: Four times a day (QID) | INTRAMUSCULAR | Status: DC | PRN

## 2019-12-05 MED ORDER — DOCUSATE SODIUM 100 MG PO CAPS: 100.0000 mg | ORAL_CAPSULE | Freq: Two times a day (BID) | ORAL | Status: DC

## 2019-12-05 MED ORDER — LACTATED RINGERS IV BOLUS
1000.0000 mL | Freq: Once | INTRAVENOUS | Status: AC
  Administered 2019-12-05: 1000 mL via INTRAVENOUS

## 2019-12-05 MED ORDER — POLYETHYLENE GLYCOL 3350 17 G PO PACK: 17.0000 g | PACK | Freq: Every day | ORAL | Status: DC | PRN

## 2019-12-05 MED ORDER — OXYCODONE HCL 5 MG PO TABS: 5.0000 mg | ORAL_TABLET | ORAL | Status: DC | PRN

## 2019-12-05 MED ORDER — FLUTICASONE PROPIONATE 50 MCG/ACT NA SUSP: 2.0000 | Freq: Every day | NASAL | Status: DC

## 2019-12-05 MED ORDER — ONDANSETRON HCL 4 MG PO TABS: 4.0000 mg | ORAL_TABLET | Freq: Four times a day (QID) | ORAL | Status: DC | PRN

## 2019-12-05 NOTE — Consult Note (Signed)
ER Consult Note   Bradley Holmes NGE:[id number] DOB: [birth year] DOA: 12/04/2019  PCP: [name], MD Consultants:  Marga Hoots - bariatrics; [place] - GI; Berry/Taylor - cardiology; Rexene Alberts - neurology Patient coming from:  Home - lives alone; NOK: Bradley Holmes, [id number]  Chief Complaint: Abnormal lab  HPI: Bradley Holmes is a 59 y.o. male with medical history significant of DM; HTN; HLD; CAD; OSA; and morbid obesity (BMI 42.77) presenting with hyperkalemia after starting a new diet.  He made a decision to go to weight loss clinic.  He was referred to [facility] here in [place] and had prelim on 9/7, met with them last week, started diet a week ago with Optifast.  6 meals/day every 2.5 hours, some high protein.  They made adjustments to his insulin up front.  He noticed on Monday that he felt tingly and like he had been working out, weak, achy, sore.  He reached out to them and his PCP and they adjusted his BP medication - cut most of it in half and called in new prescriptions, thought HCTZ might be part of the issue.  The doctor also recommended that he come in for blood work and he went in yesterday AM.  They called last night and said his K+ was elevated and encouraged him to come to the urgent care of ER to get treatment.  He feels tired but fine now.  He had Lokelma maybe 2-3 hours ago.  Historically, he has low K+ and has been on K+ for years - he was started on Aldactone maybe a year ago and they lowered his K+ supplements.  His K+ has been fine on that combination since then.    ED Course:  Carryover, per Dr. Nevada Crane:  59 yo M PMH morbid obesity had a bariatric surgery done at outside facility and was put on high protein liquid diet. Labs done outpatient. Was called back and asked to go to the ER due to elevated K+ 6.4.   Review of Systems: As per HPI; otherwise review of systems reviewed and negative.   Ambulatory Status:  Ambulates without assistance  COVID Vaccine Status:    Complete  Past Medical History:  Diagnosis Date  . Adenomatous colon polyp   . Coronary artery disease    LEXISCAN, 01/05/2011 - defect seen in Basal Inferior, Mid Inferior, and Apical Lateral region(s)-consistent with infarct/scar, global LV systolic function mildly reduced, abnormal study  . Gastroparesis   . GERD (gastroesophageal reflux disease)   . Hyperlipidemia   . Hypertension    2D ECHO, 10/10/2010 - EF >55%, normal  . Insulin dependent diabetes mellitus   . Pseudoaneurysm ([place])    LEA DOPPLER, 11/01/2010 - right groin hematoma measuring 2.47x0.62x1.61 centimeters in greatest diameter    Past Surgical History:  Procedure Laterality Date  . CARDIAC CATHETERIZATION  10/24/2010   CTI - 3030 Resolute drug-eluting stent, 3.76m resulting reduction og a long CT to 0% residual with excellent flow.  .Marland KitchenCARDIOVERSION N/A 12/02/2017   Procedure: CARDIOVERSION;  Surgeon: MLarey Dresser MD;  [facility];  Service: Cardiovascular;  Laterality: N/A;    Social History   Socioeconomic History  . Marital status: Single    Spouse name: Not on file  . Number of children: Not on file  . Years of education: Not on file  . Highest education level: Not on file  Occupational History  . Occupation: DOncologist Tobacco Use  . Smoking status: Never Smoker  .  Smokeless tobacco: Never Used  Vaping Use  . Vaping Use: Never used  Substance and Sexual Activity  . Alcohol use: Yes    Comment: occassionally  . Drug use: No  . Sexual activity: Not on file  Other Topics Concern  . Not on file  Social History Narrative  . Not on file   Social Determinants of Health   Financial Resource Strain:   . Difficulty of Paying Living Expenses: Not on file  Food Insecurity:   . Worried About Charity fundraiser in the Last Year: Not on file  . Ran Out of Food in the Last Year: Not on file  Transportation Needs:   . Lack of Transportation (Medical): Not on file  . Lack  of Transportation (Non-Medical): Not on file  Physical Activity:   . Days of Exercise per Week: Not on file  . Minutes of Exercise per Session: Not on file  Stress:   . Feeling of Stress : Not on file  Social Connections:   . Frequency of Communication with Friends and Family: Not on file  . Frequency of Social Gatherings with Friends and Family: Not on file  . Attends Religious Services: Not on file  . Active Member of Clubs or Organizations: Not on file  . Attends Archivist Meetings: Not on file  . Marital Status: Not on file  Intimate Partner Violence:   . Fear of Current or Ex-Partner: Not on file  . Emotionally Abused: Not on file  . Physically Abused: Not on file  . Sexually Abused: Not on file    Allergies  Allergen Reactions  . Norvasc [Amlodipine Besylate] Other (See Comments)    Edema  . Sulfa Antibiotics Hives    Family History  Problem Relation Age of Onset  . Heart disease Mother 90       Stent placement  . Diabetes Mother   . Diabetes Sister   . Thyroid cancer Brother        2010  . Prostate cancer Maternal Grandfather   . Emphysema Paternal Grandfather     Prior to Admission medications   Medication Sig Start Date End Date Taking? Authorizing Provider  amLODipine (NORVASC) 5 MG tablet Take 5 mg by mouth daily. 12/02/19  Yes [provider]  apixaban (ELIQUIS) 5 MG TABS tablet Take 5 mg by mouth 2 (two) times daily.   Yes [provider]  Coenzyme Q10 (CO Q-10) 200 MG CAPS Take 200 mg by mouth daily.    Yes [provider]  dapagliflozin propanediol (FARXIGA) 10 MG TABS tablet Take 10 mg by mouth daily.   Yes [provider]  fluticasone (FLONASE) 50 MCG/ACT nasal spray Place 2 sprays into both nostrils at bedtime.   Yes [provider]  Insulin Aspart (NOVOLOG FLEXPEN [place]) Inject 5-10 Units into the skin 3 (three) times daily with meals.    Yes [provider]  nitroGLYCERIN (NITROSTAT) 0.4 MG  SL tablet PLACE 1 TABLET (0.4 MG TOTAL) UNDER THE TONGUE EVERY 5 (FIVE) MINUTES AS NEEDED FOR CHEST PAIN. 11/30/19  Yes [name], MD  olmesartan (BENICAR) 20 MG tablet Take 20 mg by mouth daily. 12/02/19  Yes [provider]  omeprazole (PRILOSEC) 20 MG capsule Take 20 mg by mouth in the morning and at bedtime.    Yes [provider]  Potassium Chloride ER 20 MEQ TBCR Take 20 mEq by mouth in the morning and at bedtime.  02/05/17  Yes [provider]  PRALUENT 150 MG/ML SOAJ INJECT 150 MG INTO THE SKIN EVERY 14 (FOURTEEN) DAYS. 10/22/19  Yes [name], MD  spironolactone (ALDACTONE) 50 MG tablet Take 25 mg by mouth every morning.  11/06/18  Yes [provider]  TRESIBA FLEXTOUCH 200 UNIT/ML SOPN Inject 70 Units as directed daily.  02/01/17  Yes [provider]  triamcinolone cream (KENALOG) 0.1 % Apply 1 application topically 2 (two) times daily as needed (DRY SKIN).    Yes [provider]  aspirin EC 81 MG tablet Take 1 tablet (81 mg total) by mouth daily. Patient taking differently: Take 81 mg by mouth every evening.  10/15/17   [name], MD  BD PEN NEEDLE NANO U/F 32G X 4 MM MISC  06/24/12   [provider]  BYSTOLIC 10 MG tablet TAKE 1 TABLET EVERY EVENING Patient taking differently: Take 5 mg by mouth every evening.  07/30/12   [name], MD  ONE TOUCH ULTRA TEST test strip  11/14/12   [provider]  [facility] DELICA LANCETS 82U MISC Apply 1 strip topically 3 (three) times daily. 11/16/14   [provider]    Physical Exam: Vitals:   12/05/19 1230 12/05/19 1255 12/05/19 1415 12/05/19 1430  BP: 119/63  127/66 129/65  Pulse: (!) 48  61 (!) 57  Resp: _0 Temp:  97.6 F (36.4 C)    TempSrc:  Oral    SpO2: 98%  96% 95%  Weight:      Height:         . General:  Appears calm and comfortable and is NAD . Eyes:  PERRL, EOMI, normal lids, iris . ENT:  grossly normal hearing, lips  & tongue, mmm . Neck:  no LAD, masses or thyromegaly . Cardiovascular:  RR with bradycardia, no m/r/g. No LE edema.  Marland Kitchen Respiratory:   CTA bilaterally with no wheezes/rales/rhonchi.  Normal respiratory effort. . Abdomen:  soft, NT, ND, NABS . Back:   normal alignment, no CVAT . Skin:  no rash or induration seen on limited exam . Musculoskeletal:  grossly normal tone BUE/BLE, good ROM, no bony abnormality . Psychiatric:  grossly normal mood and affect, speech fluent and appropriate, AOx3 . Neurologic:  CN 2-12 grossly intact, moves all extremities in coordinated fashion    Radiological Exams on Admission: No results found.  EKG: Independently reviewed.  Sinus bradycardia with rate 53; IVCD; nonspecific ST changes with no evidence of acute ischemia -No peaked T waves appreciated on telemetry   Labs on Admission: I have personally reviewed the available labs and imaging studies at the time of the admission.  Pertinent labs:   K+ 6.3 CO2 19 BUN 64/Creatinine 2.14/GFR 33 AST 45/ALT 56 Normal CBC Flu/COVID negative   Assessment/Plan Principal Problem:   Acute hyperkalemia Active Problems:   Essential hypertension   Dyslipidemia   Persistent atrial fibrillation (HCC)   Obstructive sleep apnea   Morbid obesity (HCC)   Stage 3b chronic kidney disease (HCC)   Hyperkalemia -Patient with acute/subacute hyperkalemia which appears to be resulting from AKI due to volume deficiency in setting of diuretic/ARB use and from use of Optifast diet plan -Was given calcium gluconate and Lokelma in ER -Initial plan was to admit for ongoing monitoring but his K+ has decreased from 6.4 -> 6.3 -> 5.3 and the patient prefers d/c to home rather than ongoing inpatient care -Patient should abstain from further K+ supplementation as well as Aldactone  until PCP and/or cardiology f/u on Monday  AKI on Stage 3b CKD -Patient has had progressive CKD and this has been discussed with him by his PCP -His  baseline creatinine appears to be in the 1.8 range -Creatinine on presentation was 2.14 -It appears to have returned to baseline -Ongoing gentle IVF hydration offered but he prefers discharge -Increased PO fluid intake encouraged for the duration of the weekend -needs repeat BMP on Monday with PCP/cardiology  Morbid obesity -Body mass index is 42.77 kg/m. -Patient committed to weight loss and started Optifast but developed hyperkalemia -Would strongly consider NOT resuming Optifast -Needs bariatric clinic f/u -Could also consider bariatric surgery, as he appears to be a good candidate and this may lead to more sustained long-term weight loss  HTN -Continue Bystolic tonight as usual -Resume amlodopine tomorrow AM -Hold olmesartan; can restart prior to PCP f/u if SBP trends above 160 -Hold Aldactone (and K+ supplementation) until after PCP f/u -I have discussed this plan with the patient and Dr. Sherry Ruffing and we are all I agreement  Afib -Continue Bystolic for rate control -Continue Eliquis  DM -Resume home medications - Praluent, Farxiga, Tresiba, Novolog  OSA -Continue CPAP    Note: This patient has been tested and is negative for the novel coronavirus COVID-19.   Based on clinical improvement while in the ER; improvement in lab abnormalities; and patient desire for d/c, the patient does appear to be safe for d/c with close outpatient f/u at this time.     Karmen Bongo MD [facility]   How to contact the [facility] Attending or Consulting provider [place] or covering provider during after hours [place], for this patient?  1. Check the care team in [facility] and look for a) attending/consulting TR[facility] provider listed and b) the [facility] team listed 2. Log into [email or web address] and use [place]'s universal password to access. If you do not have the password, please contact the hospital operator. 3. Locate the [facility] provider you are looking for under [facility] and page to a  number that you can be directly reached. 4. If you still have difficulty reaching the provider, please page the [facility] (Director on Call) for the Hospitalists listed on amion for assistance.   12/05/2019, 2:40 PM

## 2019-12-05 NOTE — Discharge Instructions (Addendum)
Your work-up overnight showed elevated potassium which was successfully treated.  The hospitalist admitting team offered admission but she would rather continue treatment at home.  Please use their recommendations for medication changes and follow-up with your primary doctor.  If any symptoms change or worsen, please return to the nearest emergency department.  Per the hospitalist: -Would strongly consider NOT resuming Optifast -Needs bariatric clinic f/u   HTN -Continue Bystolic tonight as usual -Resume amlodopine tomorrow AM -Hold olmesartan; can restart prior to PCP f/u if SBP trends above 160 -Hold Aldactone (and K+ supplementation) until after PCP f/u   Afib -Continue Bystolic for rate control -Continue Eliquis   DM -Resume home medications - Praluent, Farxiga, Tresiba, Novolog

## 2019-12-05 NOTE — ED Provider Notes (Signed)
[facility] EMERGENCY DEPARTMENT Provider Note   CSN: [id number] Arrival date & time: 12/04/19  1830     History Chief Complaint  Patient presents with  . Abnormal Lab    Bradley Holmes is a 59 y.o. male.  Patient referred to the emergency department for hyperkalemia.  Patient reports that he was recently started on a new high-protein liquid diet through [facility] program.  After a couple of days he started feeling weak all over.  They adjusted his blood pressure medications but he did not improve.  He had outpatient blood drawn yesterday morning and was called in the afternoon and told to go to the ER because of elevated potassium.        Past Medical History:  Diagnosis Date  . Adenomatous colon polyp   . Coronary artery disease    LEXISCAN, 01/05/2011 - defect seen in Basal Inferior, Mid Inferior, and Apical Lateral region(s)-consistent with infarct/scar, global LV systolic function mildly reduced, abnormal study  . Gastroparesis   . GERD (gastroesophageal reflux disease)   . Hyperlipidemia   . Hypertension    2D ECHO, 10/10/2010 - EF >55%, normal  . Insulin dependent diabetes mellitus   . Pseudoaneurysm ([place])    LEA DOPPLER, 11/01/2010 - right groin hematoma measuring 2.47x0.62x1.61 centimeters in greatest diameter    Patient Active Problem List   Diagnosis Date Noted  . Nuclear sclerotic cataract of right eye 08/04/2019  . Severe nonproliferative diabetic retinopathy of right eye, with macular edema, associated with type 2 diabetes mellitus ([place]) 08/04/2019  . Severe nonproliferative diabetic retinopathy of left eye ([place]) 08/04/2019  . PVC's (premature ventricular contractions) 10/07/2018  . Obstructive sleep apnea 11/20/2017  . Morbid obesity ([place]) 11/20/2017  . Persistent atrial fibrillation ([place]) 10/15/2017  . Coronary artery disease 09/01/2012  . Essential hypertension 09/01/2012  . Hyperlipidemia 09/01/2012  . Insulin  dependent diabetes mellitus 09/01/2012    Past Surgical History:  Procedure Laterality Date  . CARDIAC CATHETERIZATION  10/24/2010   CTI - 3030 Resolute drug-eluting stent, 3.54mm resulting reduction og a long CT to 0% residual with excellent flow.  Marland Kitchen CARDIOVERSION N/A 12/02/2017   Procedure: CARDIOVERSION;  Surgeon: Larey Dresser, MD;  Location: [facility] ENDOSCOPY;  Service: Cardiovascular;  Laterality: N/A;       Family History  Problem Relation Age of Onset  . Heart disease Mother 15       Stent placement  . Diabetes Mother   . Diabetes Sister   . Thyroid cancer Brother        2010  . Prostate cancer Maternal Grandfather   . Emphysema Paternal Grandfather     Social History   Tobacco Use  . Smoking status: Never Smoker  . Smokeless tobacco: Never Used  Vaping Use  . Vaping Use: Never used  Substance Use Topics  . Alcohol use: Yes    Comment: occassionally  . Drug use: No    Home Medications Prior to Admission medications   Medication Sig Start Date End Date Taking? Authorizing Provider  apixaban (ELIQUIS) 5 MG TABS tablet Take 5 mg by mouth 2 (two) times daily.    [provider]  aspirin EC 81 MG tablet Take 1 tablet (81 mg total) by mouth daily. Patient taking differently: Take 81 mg by mouth every evening.  10/15/17   [name], MD  BD PEN NEEDLE NANO U/F 32G X 4 MM MISC  06/24/12   [provider]  BYSTOLIC 10  MG tablet TAKE 1 TABLET EVERY EVENING Patient taking differently: Take 10 mg by mouth every evening.  07/30/12   [name], MD  Coenzyme Q10 (CO Q-10) 200 MG CAPS Take 200 mg by mouth daily.     [provider]  dapagliflozin propanediol (FARXIGA) 10 MG TABS tablet Take 10 mg by mouth daily.    [provider]  hydrocortisone cream ([employer] CORTISONE MAXIMUM STRENGTH) 1 % Apply 1 application topically as needed for itching.    [provider]  Insulin Aspart (NOVOLOG FLEXPEN [place]) Inject 20 Units into the  skin 3 (three) times daily with meals. Pt takes 25 units    [provider]  nitroGLYCERIN (NITROSTAT) 0.4 MG SL tablet PLACE 1 TABLET (0.4 MG TOTAL) UNDER THE TONGUE EVERY 5 (FIVE) MINUTES AS NEEDED FOR CHEST PAIN. 11/30/19   [name], MD  omeprazole (PRILOSEC) 20 MG capsule Take 20 mg by mouth daily.    [provider]  ONE TOUCH ULTRA TEST test strip  11/14/12   [provider]  Jonetta Speak LANCETS 71G MISC Apply 1 strip topically 3 (three) times daily. 11/16/14   [provider]  Potassium Chloride ER 20 MEQ TBCR Take 20 mEq by mouth 3 (three) times daily.  02/05/17   [provider]  PRALUENT 150 MG/ML SOAJ INJECT 150 MG INTO THE SKIN EVERY 14 (FOURTEEN) DAYS. 10/22/19   [name], MD  spironolactone (ALDACTONE) 25 MG tablet Take 25 mg by mouth every morning. 11/06/18   [provider]  TRESIBA FLEXTOUCH 200 UNIT/ML SOPN Inject 92 Units as directed daily. 140 units 02/01/17   [provider]  triamcinolone cream (KENALOG) 0.1 % Apply 1 application topically 2 (two) times daily.    [provider]  TRIBENZOR 40-10-25 MG TABS Take 1 tablet by mouth daily.  08/21/12   [provider]    Allergies    Norvasc [amlodipine besylate] and Sulfa antibiotics  Review of Systems   Review of Systems  Constitutional: Positive for fatigue.  All other systems reviewed and are negative.   Physical Exam Updated Vital Signs BP 125/71 (BP Location: Right Arm)   Pulse (!) 51   Temp 97.9 F (36.6 C) (Oral)   Resp (!) 22   Ht 5\' 6"  (1.676 m)   Wt 120.2 kg   SpO2 100%   BMI 42.77 kg/m   Physical Exam Vitals and nursing note reviewed.  Constitutional:      General: He is not in acute distress.    Appearance: Normal appearance. He is well-developed.  HENT:     Head: Normocephalic and atraumatic.     Right Ear: Hearing normal.     Left Ear: Hearing normal.     Nose: Nose normal.  Eyes:      Conjunctiva/sclera: Conjunctivae normal.     Pupils: Pupils are equal, round, and reactive to light.  Cardiovascular:     Rate and Rhythm: Regular rhythm.     Heart sounds: S1 normal and S2 normal. No murmur heard.  No friction rub. No gallop.   Pulmonary:     Effort: Pulmonary effort is normal. No respiratory distress.     Breath sounds: Normal breath sounds.  Chest:     Chest wall: No tenderness.  Abdominal:     General: Bowel sounds are normal.     Palpations: Abdomen is soft.     Tenderness: There is no abdominal tenderness. There is no guarding or rebound. Negative  signs include Murphy's sign and McBurney's sign.     Hernia: No hernia is present.  Musculoskeletal:        General: Normal range of motion.     Cervical back: Normal range of motion and neck supple.  Skin:    General: Skin is warm and dry.     Findings: No rash.  Neurological:     Mental Status: He is alert and oriented to person, place, and time.     GCS: GCS eye subscore is 4. GCS verbal subscore is 5. GCS motor subscore is 6.     Cranial Nerves: No cranial nerve deficit.     Sensory: No sensory deficit.     Coordination: Coordination normal.  Psychiatric:        Speech: Speech normal.        Behavior: Behavior normal.        Thought Content: Thought content normal.     ED Results / Procedures / Treatments   Labs (all labs ordered are listed, but only abnormal results are displayed) Labs Reviewed  CBC WITH DIFFERENTIAL/PLATELET - Abnormal; Notable for the following components:      Result Value   Monocytes Absolute 1.1 (*)    All other components within normal limits  COMPREHENSIVE METABOLIC PANEL - Abnormal; Notable for the following components:   Sodium 134 (*)    Potassium 6.3 (*)    CO2 19 (*)    Glucose, Bld 102 (*)    BUN 64 (*)    Creatinine, Ser 2.14 (*)    AST 45 (*)    ALT 56 (*)    GFR calc non Af Amer 33 (*)    GFR calc Af Amer 38 (*)    All other components within normal limits     EKG EKG Interpretation  Date/Time:  Saturday December 05 2019 04:50:09 EDT Ventricular Rate:  53 PR Interval:    QRS Duration: 121 QT Interval:  456 QTC Calculation: 429 R Axis:   -85 Text Interpretation: Sinus rhythm Prolonged PR interval Nonspecific IVCD with LAD Anterior infarct, old No significant change since last tracing Confirmed by Orpah Greek [phone]) on 12/05/2019 4:57:35 AM   Radiology No results found.  Procedures Procedures (including critical care time)  Medications Ordered in ED Medications  sodium zirconium cyclosilicate (LOKELMA) packet 10 g (has no administration in time range)  calcium gluconate 1 g/ 50 mL sodium chloride IVPB (has no administration in time range)    ED Course  I have reviewed the triage vital signs and the nursing notes.  Pertinent labs & imaging results that were available during my care of the patient were reviewed by me and considered in my medical decision making (see chart for details).    MDM Rules/Calculators/A&P                          Lab work here does show persistent hyperkalemia without any visible hemolysis.  Additionally, patient has an acute kidney injury.  Patient reports that he normally has hypokalemia and is on both spironolactone and potassium supplementation which is likely exacerbating his hyperkalemia.  Etiology of the acute kidney injury is unclear.  He has been on mostly liquid diet and has been increasing his water intake as well.  He reports that he has been urinating more often than usual, so doubt outlet obstruction.  Patient will require hospitalization for further management.  EKG does not show QRS patient continues.  No Arrhythmia noted.  Final Clinical Impression(s) / ED Diagnoses Final diagnoses:  AKI (acute kidney injury) ([place])  Hyperkalemia    Rx / DC Orders ED Discharge Orders    None       Aviyon Hocevar, Gwenyth Allegra, MD 12/05/19 0500

## 2019-12-05 NOTE — ED Notes (Signed)
Attempted report to inpatient unit, asked to call back

## 2019-12-09 ENCOUNTER — Other Ambulatory Visit: Payer: Self-pay

## 2019-12-09 ENCOUNTER — Ambulatory Visit: Payer: BC Managed Care – PPO | Admitting: Cardiovascular Disease

## 2019-12-09 ENCOUNTER — Encounter: Payer: Self-pay | Admitting: Cardiovascular Disease

## 2019-12-09 DIAGNOSIS — I4819 Other persistent atrial fibrillation: Secondary | ICD-10-CM | POA: Diagnosis not present

## 2019-12-09 DIAGNOSIS — E785 Hyperlipidemia, unspecified: Secondary | ICD-10-CM

## 2019-12-09 DIAGNOSIS — I2510 Atherosclerotic heart disease of native coronary artery without angina pectoris: Secondary | ICD-10-CM

## 2019-12-09 DIAGNOSIS — G4733 Obstructive sleep apnea (adult) (pediatric): Secondary | ICD-10-CM

## 2019-12-09 DIAGNOSIS — I10 Essential (primary) hypertension: Secondary | ICD-10-CM | POA: Diagnosis not present

## 2019-12-09 NOTE — Assessment & Plan Note (Signed)
History of paroxysmal A. fib status post DC cardioversion by Dr. Aundra Dubin 12/02/2017 maintaining sinus rhythm on Eliquis oral anticoagulation.

## 2019-12-09 NOTE — Patient Instructions (Signed)
Medication Instructions:  The current medical regimen is effective;  continue present plan and medications.  *If you need a refill on your cardiac medications before your next appointment, please call your pharmacy*   Follow-Up: At [facility], you and your health needs are our priority.  As part of our continuing mission to provide you with exceptional heart care, we have created designated Provider Care Teams.  These Care Teams include your primary Cardiologist (physician) and Advanced Practice Providers (APPs -  Physician Assistants and Nurse Practitioners) who all work together to provide you with the care you need, when you need it.  We recommend signing up for the patient portal called "MyChart".  Sign up information is provided on this After Visit Summary.  MyChart is used to connect with patients for Virtual Visits (Telemedicine).  Patients are able to view lab/test results, encounter notes, upcoming appointments, etc.  Non-urgent messages can be sent to your provider as well.   To learn more about what you can do with MyChart, go to [email or web address].    Your next appointment:   12 month(s)  The format for your next appointment:   In Person  Provider:   Quay Burow, MD   Other Instructions Dr.Caren Leafy Ro information: [facility]  [address] [place], [place] [address] [phone]

## 2019-12-09 NOTE — Assessment & Plan Note (Signed)
History of dyslipidemia on Praluent with lipid profile performed 02/05/2019 revealing total cholesterol 94, LDL 38 and HDL 33.

## 2019-12-09 NOTE — Assessment & Plan Note (Signed)
History of obstructive sleep apnea on CPAP. 

## 2019-12-09 NOTE — Assessment & Plan Note (Signed)
History of CAD status post cardiac catheterization performed by myself 10/24/2010 revealing occluded circumflex in the AV groove, 50% mid LAD and proximal PDA stenosis with normal LV function.  Ultimately was able to cross his circumflex CTO and stented him with a 3 mm x 30 mm long resolute drug-eluting stent.  He did well afterwards and had a follow-up Myoview that showed improved perfusion to his lateral wall.  He denies chest pain or shortness of breath.

## 2019-12-09 NOTE — Progress Notes (Signed)
12/09/2019 [name]   12/16/1960  [id number]  Primary Physician [name], MD Primary Cardiologist: [name] MD [name], [place]  HPI:  Bradley Holmes is a 59 y.o.  mildly overweight single Caucasian male with no children whom I last saw in the  10/14/2018. He relocated to [place] to be the [employer], previously at [employer] in [place]. He was initially referred because of an abnormal EKG with risk factors that included diabetes, hypertension and hyperlipidemia as well as family history. He was getting some chest pain and dyspnea. His EKG showed poor R-wave progression with Q-waves, a left anterior fascicular block. Myoview stress test showed inferolateral scar with mild peri/infarct ischemia, and because of this I performed cardiac catheterization on him 10/24/2010 revealing an occluded circumflex in the mid AV groove, 50% mid LAD and proximal PDA stenosis with normal LV function. I was ultimately able to cross his chronically occluded circumflex and stented him with a resolute 3.0 x 30 drug-eluting stent. Since that time, he had done well. A followup Myoview showed improved perfusion to the lateral wall.  Because of statin intolerance he is beginning Repatha which resulted in marked improvement in his lipid profile. Since I saw him in November 2018 he is noticed increasing dyspnea on exertion this past spring. He saw his PCP recently he noticed that his heart rate was irregular and he was diagnosed with A. fib and begun on Eliquis oral anticoagulation.  I arranged for him to undergo DC cardioversion by Dr. Aundra Dubin 12/02/2017 which was performed successfully.  He remains on Eliquis oral anticoagulation.  He does have obstructive sleep apnea on CPAP.    Since I saw him a year ago he continues to do well.  He did see a weight loss center at [facility] who put him on an all liquid high-protein diet which  resulted in symptomatic hyperkalemia.  Otherwise, he denies chest pain or shortness of breath.  Current Meds  Medication Sig  . amLODipine (NORVASC) 5 MG tablet Take 5 mg by mouth daily.  Marland Kitchen apixaban (ELIQUIS) 5 MG TABS tablet Take 5 mg by mouth 2 (two) times daily.  Marland Kitchen aspirin EC 81 MG tablet Take 1 tablet (81 mg total) by mouth daily. (Patient taking differently: Take 81 mg by mouth every evening. )  . BD PEN NEEDLE NANO U/F 32G X 4 MM MISC   . BYSTOLIC 10 MG tablet TAKE 1 TABLET EVERY EVENING (Patient taking differently: Take 5 mg by mouth every evening. )  . Coenzyme Q10 (CO Q-10) 200 MG CAPS Take 200 mg by mouth daily.   . dapagliflozin propanediol (FARXIGA) 10 MG TABS tablet Take 10 mg by mouth daily.  . fluticasone (FLONASE) 50 MCG/ACT nasal spray Place 2 sprays into both nostrils at bedtime.  . Insulin Aspart (NOVOLOG FLEXPEN [place]) Inject 10 Units into the skin 3 (three) times daily with meals.   . nitroGLYCERIN (NITROSTAT) 0.4 MG SL tablet PLACE 1 TABLET (0.4 MG TOTAL) UNDER THE TONGUE EVERY 5 (FIVE) MINUTES AS NEEDED FOR CHEST PAIN.  Marland Kitchen omeprazole (PRILOSEC) 20 MG capsule Take 20 mg by mouth in the morning and at bedtime.   . ONE TOUCH ULTRA TEST test strip   . ONETOUCH DELICA LANCETS 20U MISC Apply 1 strip topically 3 (three) times daily.  Marland Kitchen PRALUENT 150 MG/ML SOAJ INJECT 150 MG INTO THE SKIN EVERY 14 (FOURTEEN) DAYS.  Marland Kitchen TRESIBA FLEXTOUCH 200 UNIT/ML SOPN Inject  70 Units as directed daily.   Marland Kitchen triamcinolone cream (KENALOG) 0.1 % Apply 1 application topically 2 (two) times daily as needed (DRY SKIN).      Allergies  Allergen Reactions  . Norvasc [Amlodipine Besylate] Other (See Comments)    Edema  . Sulfa Antibiotics Hives    Social History   Socioeconomic History  . Marital status: Single    Spouse name: Not on file  . Number of children: Not on file  . Years of education: Not on file  . Highest education level: Not on file  Occupational History  . Occupation: Counselling psychologist  Tobacco Use  . Smoking status: Never Smoker  . Smokeless tobacco: Never Used  Vaping Use  . Vaping Use: Never used  Substance and Sexual Activity  . Alcohol use: Yes    Comment: occassionally  . Drug use: No  . Sexual activity: Not on file  Other Topics Concern  . Not on file  Social History Narrative  . Not on file   Social Determinants of Health   Financial Resource Strain:   . Difficulty of Paying Living Expenses: Not on file  Food Insecurity:   . Worried About Charity fundraiser in the Last Year: Not on file  . Ran Out of Food in the Last Year: Not on file  Transportation Needs:   . Lack of Transportation (Medical): Not on file  . Lack of Transportation (Non-Medical): Not on file  Physical Activity:   . Days of Exercise per Week: Not on file  . Minutes of Exercise per Session: Not on file  Stress:   . Feeling of Stress : Not on file  Social Connections:   . Frequency of Communication with Friends and Family: Not on file  . Frequency of Social Gatherings with Friends and Family: Not on file  . Attends Religious Services: Not on file  . Active Member of Clubs or Organizations: Not on file  . Attends Archivist Meetings: Not on file  . Marital Status: Not on file  Intimate Partner Violence:   . Fear of Current or Ex-Partner: Not on file  . Emotionally Abused: Not on file  . Physically Abused: Not on file  . Sexually Abused: Not on file     Review of Systems: General: negative for chills, fever, night sweats or weight changes.  Cardiovascular: negative for chest pain, dyspnea on exertion, edema, orthopnea, palpitations, paroxysmal nocturnal dyspnea or shortness of breath Dermatological: negative for rash Respiratory: negative for cough or wheezing Urologic: negative for hematuria Abdominal: negative for nausea, vomiting, diarrhea, bright red blood per rectum, melena, or hematemesis Neurologic: negative for visual changes, syncope, or  dizziness All other systems reviewed and are otherwise negative except as noted above.    Blood pressure 120/66, pulse (!) 56, height 5\' 6"  (1.676 m), weight 260 lb (117.9 kg), SpO2 99 %.  General appearance: alert and no distress Neck: no adenopathy, no carotid bruit, no JVD, supple, symmetrical, trachea midline and thyroid not enlarged, symmetric, no tenderness/mass/nodules Lungs: clear to auscultation bilaterally Heart: regular rate and rhythm, S1, S2 normal, no murmur, click, rub or gallop Extremities: extremities normal, atraumatic, no cyanosis or edema Pulses: 2+ and symmetric Skin: Skin color, texture, turgor normal. No rashes or lesions Neurologic: Alert and oriented X 3, normal strength and tone. Normal symmetric reflexes. Normal coordination and gait  EKG not performed today  ASSESSMENT AND PLAN:   Coronary artery disease History of CAD status post  cardiac catheterization performed by myself 10/24/2010 revealing occluded circumflex in the AV groove, 50% mid LAD and proximal PDA stenosis with normal LV function.  Ultimately was able to cross his circumflex CTO and stented him with a 3 mm x 30 mm long resolute drug-eluting stent.  He did well afterwards and had a follow-up Myoview that showed improved perfusion to his lateral wall.  He denies chest pain or shortness of breath.  Essential hypertension History of essential potential blood pressure measured today at 120/66.  He is on amlodipine and Bystolic.  Dyslipidemia History of dyslipidemia on Praluent with lipid profile performed 02/05/2019 revealing total cholesterol 94, LDL 38 and HDL 33.  Persistent atrial fibrillation (HCC) History of paroxysmal A. fib status post DC cardioversion by Dr. Aundra Dubin 12/02/2017 maintaining sinus rhythm on Eliquis oral anticoagulation.  Obstructive sleep apnea History of obstructive sleep apnea on CPAP      [name] MD [facility], [facility] 12/09/2019 8:18 AM

## 2019-12-09 NOTE — Assessment & Plan Note (Signed)
History of essential potential blood pressure measured today at 120/66.  He is on amlodipine and Bystolic.

## 2019-12-22 ENCOUNTER — Telehealth: Payer: Self-pay

## 2019-12-22 NOTE — Telephone Encounter (Signed)
Called and lmomed the pt to call us back regarding insurance for p150 pa

## 2020-01-04 ENCOUNTER — Encounter (INDEPENDENT_AMBULATORY_CARE_PROVIDER_SITE_OTHER): Payer: Self-pay | Admitting: Ophthalmology

## 2020-01-04 ENCOUNTER — Ambulatory Visit (INDEPENDENT_AMBULATORY_CARE_PROVIDER_SITE_OTHER): Payer: BC Managed Care – PPO | Admitting: Ophthalmology

## 2020-01-04 ENCOUNTER — Other Ambulatory Visit: Payer: Self-pay

## 2020-01-04 DIAGNOSIS — E113412 Type 2 diabetes mellitus with severe nonproliferative diabetic retinopathy with macular edema, left eye: Secondary | ICD-10-CM

## 2020-01-04 DIAGNOSIS — E113411 Type 2 diabetes mellitus with severe nonproliferative diabetic retinopathy with macular edema, right eye: Secondary | ICD-10-CM | POA: Diagnosis not present

## 2020-01-04 DIAGNOSIS — E113492 Type 2 diabetes mellitus with severe nonproliferative diabetic retinopathy without macular edema, left eye: Secondary | ICD-10-CM

## 2020-01-04 DIAGNOSIS — G4733 Obstructive sleep apnea (adult) (pediatric): Secondary | ICD-10-CM

## 2020-01-04 DIAGNOSIS — H2512 Age-related nuclear cataract, left eye: Secondary | ICD-10-CM | POA: Diagnosis not present

## 2020-01-04 NOTE — Assessment & Plan Note (Signed)
Local focal retinal thickening superotemporal to the fovea right eye continues to improve post focal laser.  Will observe

## 2020-01-04 NOTE — Assessment & Plan Note (Signed)
Stable OS observed

## 2020-01-04 NOTE — Progress Notes (Signed)
01/04/2020     CHIEF COMPLAINT Patient presents for Retina Follow Up   HISTORY OF PRESENT ILLNESS: Bradley Holmes is a 59 y.o. male who presents to the clinic today for:   HPI    Retina Follow Up    Patient presents with  Diabetic Retinopathy.  In both eyes.  This started 4 months ago.  Severity is mild.  Duration of 4 months.  Since onset it is stable.          Comments    4 Month Diabetic F/U OU  Pt denies noticeable changes to [place] OU since last visit. Pt denies ocular pain, flashes of light, or floaters OU.  A1c: 7.1, 11/2019 LBS: 110 this AM       Last edited by Rockie Neighbours, [place] on 01/04/2020  2:55 PM. (History)      Referring physician: [name], MD [place],  [place] [address]  HISTORICAL INFORMATION:   Selected notes from the [facility]: No current outpatient medications on file. (Ophthalmic Drugs)   No current facility-administered medications for this visit. (Ophthalmic Drugs)   Current Outpatient Medications (Other)  Medication Sig  . amLODipine (NORVASC) 5 MG tablet Take 5 mg by mouth daily.  Marland Kitchen apixaban (ELIQUIS) 5 MG TABS tablet Take 5 mg by mouth 2 (two) times daily.  Marland Kitchen aspirin EC 81 MG tablet Take 1 tablet (81 mg total) by mouth daily. (Patient taking differently: Take 81 mg by mouth every evening. )  . BD PEN NEEDLE NANO U/F 32G X 4 MM MISC   . BYSTOLIC 10 MG tablet TAKE 1 TABLET EVERY EVENING (Patient taking differently: Take 5 mg by mouth every evening. )  . Coenzyme Q10 (CO Q-10) 200 MG CAPS Take 200 mg by mouth daily.   . dapagliflozin propanediol (FARXIGA) 10 MG TABS tablet Take 10 mg by mouth daily.  . fluticasone (FLONASE) 50 MCG/ACT nasal spray Place 2 sprays into both nostrils at bedtime.  . Insulin Aspart (NOVOLOG FLEXPEN [place]) Inject 10 Units into the skin 3 (three) times daily with meals.   . nitroGLYCERIN (NITROSTAT) 0.4 MG SL tablet PLACE 1 TABLET (0.4 MG TOTAL) UNDER THE  TONGUE EVERY 5 (FIVE) MINUTES AS NEEDED FOR CHEST PAIN.  Marland Kitchen omeprazole (PRILOSEC) 20 MG capsule Take 20 mg by mouth in the morning and at bedtime.   . ONE TOUCH ULTRA TEST test strip   . ONETOUCH DELICA LANCETS 83T MISC Apply 1 strip topically 3 (three) times daily.  Marland Kitchen PRALUENT 150 MG/ML SOAJ INJECT 150 MG INTO THE SKIN EVERY 14 (FOURTEEN) DAYS.  Marland Kitchen TRESIBA FLEXTOUCH 200 UNIT/ML SOPN Inject 70 Units as directed daily.   Marland Kitchen triamcinolone cream (KENALOG) 0.1 % Apply 1 application topically 2 (two) times daily as needed (DRY SKIN).    No current facility-administered medications for this visit. (Other)      REVIEW OF SYSTEMS:    ALLERGIES Allergies  Allergen Reactions  . Norvasc [Amlodipine Besylate] Other (See Comments)    Edema  . Sulfa Antibiotics Hives    PAST MEDICAL HISTORY Past Medical History:  Diagnosis Date  . Adenomatous colon polyp   . Coronary artery disease    LEXISCAN, 01/05/2011 - defect seen in Basal Inferior, Mid Inferior, and Apical Lateral region(s)-consistent with infarct/scar, global LV systolic function mildly reduced, abnormal study  . Gastroparesis   . GERD (gastroesophageal reflux disease)   . Hyperlipidemia   . Hypertension  2D ECHO, 10/10/2010 - EF >55%, normal  . Insulin dependent diabetes mellitus   . Pseudoaneurysm ([place])    LEA DOPPLER, 11/01/2010 - right groin hematoma measuring 2.47x0.62x1.61 centimeters in greatest diameter   Past Surgical History:  Procedure Laterality Date  . CARDIAC CATHETERIZATION  10/24/2010   CTI - 3030 Resolute drug-eluting stent, 3.66mm resulting reduction og a long CT to 0% residual with excellent flow.  Marland Kitchen CARDIOVERSION N/A 12/02/2017   Procedure: CARDIOVERSION;  Surgeon: Larey Dresser, MD;  Location: [facility] ENDOSCOPY;  Service: Cardiovascular;  Laterality: N/A;    FAMILY HISTORY Family History  Problem Relation Age of Onset  . Heart disease Mother 14       Stent placement  . Diabetes Mother   . Diabetes Sister     . Thyroid cancer Brother        2010  . Prostate cancer Maternal Grandfather   . Emphysema Paternal Grandfather     SOCIAL HISTORY Social History   Tobacco Use  . Smoking status: Never Smoker  . Smokeless tobacco: Never Used  Vaping Use  . Vaping Use: Never used  Substance Use Topics  . Alcohol use: Yes    Comment: occassionally  . Drug use: No         OPHTHALMIC EXAM:  Base Eye Exam    Visual Acuity (ETDRS)      Right Left   Dist cc 20/20 -2 20/20 -1   Correction: Glasses       Tonometry (Tonopen, 2:56 PM)      Right Left   Pressure 15 13       Pupils      Dark Light Shape React APD   Right 4 3 Round Brisk None   Left 4 3 Round Brisk None       Visual Fields (Counting fingers)      Left Right    Full Full       Extraocular Movement      Right Left    Full Full       Neuro/Psych    Oriented x3: Yes   Mood/Affect: Normal       Dilation    Both eyes: 1.0% Mydriacyl, 2.5% Phenylephrine @ 2:59 PM        Slit Lamp and Fundus Exam    External Exam      Right Left   External Normal Normal       Slit Lamp Exam      Right Left   Lids/Lashes Normal Normal   Conjunctiva/Sclera White and quiet White and quiet   Cornea Clear Clear   Anterior Chamber Deep and quiet Deep and quiet   Iris Round and reactive Round and reactive   Lens Clear Clear   Anterior Vitreous Normal Normal       Fundus Exam      Right Left   Posterior Vitreous Normal Normal   Disc Normal Normal   C/D Ratio 0.45 0.5   Macula Microaneurysms, Mild clinically significant macular edema, temporal in the macular region, Macular thickening, no exudates no macular thickening, Microaneurysms, no exudates   Vessels NPDR-Severe NPDR-Severe   Periphery Mild scatter treatment Normal          IMAGING AND PROCEDURES  Imaging and Procedures for 01/04/20  OCT, Retina - OU - Both Eyes       Right Eye Quality was good. Scan locations included subfoveal. Central Foveal Thickness:  262. Progression has improved. Findings include abnormal foveal contour.  Left Eye Quality was good. Scan locations included subfoveal. Central Foveal Thickness: 266. Progression has been stable. Findings include normal foveal contour.   Notes Focal laser photocoagulation changes superotemporal to the fovea, much less retinal thickening will observe                ASSESSMENT/PLAN:  Severe nonproliferative diabetic retinopathy of right eye, with macular edema, associated with type 2 diabetes mellitus (HCC) Local focal retinal thickening superotemporal to the fovea right eye continues to improve post focal laser.  Will observe  Severe nonproliferative diabetic retinopathy of left eye (HCC) Stable OS observed  Nuclear sclerotic cataract of left eye Mild nuclear sclerosis chronic changes OU  no change  Obstructive sleep apnea Patient remains nicely compliant on CPAP.  I reiterated the importance of control of nightly hypoxia for his overall health and ocular health      ICD-10-CM   1. Severe nonproliferative diabetic retinopathy of right eye, with macular edema, associated with type 2 diabetes mellitus (HCC)  E11.3411 OCT, Retina - OU - Both Eyes  2. Severe nonproliferative diabetic retinopathy of left eye, with macular edema, associated with type 2 diabetes mellitus (HCC)  E31.5400 OCT, Retina - OU - Both Eyes  3. Severe nonproliferative diabetic retinopathy of left eye without macular edema associated with type 2 diabetes mellitus ([place])  Q67.6195   4. Nuclear sclerotic cataract of left eye  H25.12   5. Obstructive sleep apnea  G47.33     1.  Excellent response to focal laser treatment right eye, much less CSME.  Retinopathy has not progressed.  2.  Patient reports excellent blood sugar control.  3.  Ophthalmic Meds Ordered this visit:  No orders of the defined types were placed in this encounter.      Return in about 6 months (around 07/04/2020) for DILATE OU,  OCT.  There are no Patient Instructions on file for this visit.   Explained the diagnoses, plan, and follow up with the patient and they expressed understanding.  Patient expressed understanding of the importance of proper follow up care.   Clent Demark Ellajane Stong M.D. Diseases & Surgery of the Retina and Vitreous Retina & Diabetic [place] 01/04/20     Abbreviations: M myopia (nearsighted); A astigmatism; H hyperopia (farsighted); P presbyopia; Mrx spectacle prescription;  CTL contact lenses; OD right eye; OS left eye; OU both eyes  XT exotropia; ET esotropia; PEK punctate epithelial keratitis; PEE punctate epithelial erosions; DES dry eye syndrome; MGD meibomian gland dysfunction; ATs artificial tears; PFAT's preservative free artificial tears; [place] nuclear sclerotic cataract; PSC posterior subcapsular cataract; ERM epi-retinal membrane; PVD posterior vitreous detachment; RD retinal detachment; DM diabetes mellitus; DR diabetic retinopathy; NPDR non-proliferative diabetic retinopathy; PDR proliferative diabetic retinopathy; CSME clinically significant macular edema; DME diabetic macular edema; dbh dot blot hemorrhages; CWS cotton wool spot; POAG primary open angle glaucoma; C/D cup-to-disc ratio; HVF humphrey visual field; GVF goldmann visual field; OCT optical coherence tomography; IOP intraocular pressure; BRVO Branch retinal vein occlusion; CRVO central retinal vein occlusion; CRAO central retinal artery occlusion; BRAO branch retinal artery occlusion; RT retinal tear; SB scleral buckle; PPV pars plana vitrectomy; VH Vitreous hemorrhage; PRP panretinal laser photocoagulation; IVK intravitreal kenalog; VMT vitreomacular traction; MH Macular hole;  NVD neovascularization of the disc; NVE neovascularization elsewhere; AREDS age related eye disease study; ARMD age related macular degeneration; POAG primary open angle glaucoma; EBMD epithelial/anterior basement membrane dystrophy; ACIOL anterior chamber  intraocular lens; IOL intraocular lens; PCIOL posterior chamber intraocular lens; Phaco/IOL  phacoemulsification with intraocular lens placement; [place] photorefractive keratectomy; LASIK laser assisted in situ keratomileusis; HTN hypertension; DM diabetes mellitus; COPD chronic obstructive pulmonary disease

## 2020-01-04 NOTE — Assessment & Plan Note (Signed)
Mild nuclear sclerosis chronic changes OU  no change

## 2020-01-04 NOTE — Assessment & Plan Note (Signed)
Patient remains nicely compliant on CPAP.  I reiterated the importance of control of nightly hypoxia for his overall health and ocular health

## 2020-02-16 ENCOUNTER — Other Ambulatory Visit: Payer: Self-pay | Admitting: Cardiovascular Disease

## 2020-03-22 ENCOUNTER — Encounter: Payer: Self-pay | Admitting: Adult Health

## 2020-04-04 ENCOUNTER — Encounter: Payer: Self-pay | Admitting: Adult Health

## 2020-07-04 ENCOUNTER — Encounter (INDEPENDENT_AMBULATORY_CARE_PROVIDER_SITE_OTHER): Payer: BC Managed Care – PPO | Admitting: Ophthalmology

## 2020-07-18 ENCOUNTER — Ambulatory Visit (INDEPENDENT_AMBULATORY_CARE_PROVIDER_SITE_OTHER): Payer: BC Managed Care – PPO | Admitting: Ophthalmology

## 2020-07-18 ENCOUNTER — Other Ambulatory Visit: Payer: Self-pay

## 2020-07-18 ENCOUNTER — Encounter (INDEPENDENT_AMBULATORY_CARE_PROVIDER_SITE_OTHER): Payer: Self-pay | Admitting: Ophthalmology

## 2020-07-18 DIAGNOSIS — H2511 Age-related nuclear cataract, right eye: Secondary | ICD-10-CM | POA: Diagnosis not present

## 2020-07-18 DIAGNOSIS — G4733 Obstructive sleep apnea (adult) (pediatric): Secondary | ICD-10-CM | POA: Diagnosis not present

## 2020-07-18 DIAGNOSIS — E113411 Type 2 diabetes mellitus with severe nonproliferative diabetic retinopathy with macular edema, right eye: Secondary | ICD-10-CM

## 2020-07-18 DIAGNOSIS — E113412 Type 2 diabetes mellitus with severe nonproliferative diabetic retinopathy with macular edema, left eye: Secondary | ICD-10-CM | POA: Diagnosis not present

## 2020-07-18 NOTE — Assessment & Plan Note (Signed)
Mild cataract opacity no impact on acuity observe

## 2020-07-18 NOTE — Progress Notes (Signed)
07/18/2020     CHIEF COMPLAINT Patient presents for Retina Follow Up (6 Mo F/U OU//Pt denies noticeable changes to [place] OU since last visit. Pt denies ocular pain, flashes of light, or floaters OU. /A1c: 6.0, 06/2020/LBS: 133 an hr ago per pt)   HISTORY OF PRESENT ILLNESS: Bradley Holmes is a 60 y.o. male who presents to the clinic today for:   HPI    Retina Follow Up    Diagnosis: Diabetic Retinopathy   Laterality: both eyes   Onset: 6 months ago   Severity: mild   Duration: 6 months   Course: stable   Comments: 6 Mo F/U OU  Pt denies noticeable changes to [place] OU since last visit. Pt denies ocular pain, flashes of light, or floaters OU.  A1c: 6.0, 06/2020 LBS: 133 an hr ago per pt       Last edited by Rockie Neighbours, [place] on 07/18/2020  3:37 PM. (History)      Referring physician: [name], MD [place],  [place] [id number]  HISTORICAL INFORMATION:   Selected notes from the [facility]: No current outpatient medications on file. (Ophthalmic Drugs)   No current facility-administered medications for this visit. (Ophthalmic Drugs)   Current Outpatient Medications (Other)  Medication Sig  . amLODipine (NORVASC) 5 MG tablet Take 5 mg by mouth daily.  Marland Kitchen apixaban (ELIQUIS) 5 MG TABS tablet Take 5 mg by mouth 2 (two) times daily.  Marland Kitchen aspirin EC 81 MG tablet Take 1 tablet (81 mg total) by mouth daily. (Patient taking differently: Take 81 mg by mouth every evening. )  . BD PEN NEEDLE NANO U/F 32G X 4 MM MISC   . BYSTOLIC 10 MG tablet TAKE 1 TABLET EVERY EVENING (Patient taking differently: Take 5 mg by mouth every evening. )  . Coenzyme Q10 (CO Q-10) 200 MG CAPS Take 200 mg by mouth daily.   . dapagliflozin propanediol (FARXIGA) 10 MG TABS tablet Take 10 mg by mouth daily.  . fluticasone (FLONASE) 50 MCG/ACT nasal spray Place 2 sprays into both nostrils at bedtime.  . Insulin Aspart (NOVOLOG FLEXPEN [place]) Inject 10 Units  into the skin 3 (three) times daily with meals.   . nitroGLYCERIN (NITROSTAT) 0.4 MG SL tablet PLACE 1 TABLET (0.4 MG TOTAL) UNDER THE TONGUE EVERY 5 (FIVE) MINUTES AS NEEDED FOR CHEST PAIN.  Marland Kitchen omeprazole (PRILOSEC) 20 MG capsule Take 20 mg by mouth in the morning and at bedtime.   . ONE TOUCH ULTRA TEST test strip   . ONETOUCH DELICA LANCETS 62Z MISC Apply 1 strip topically 3 (three) times daily.  Marland Kitchen PRALUENT 150 MG/ML SOAJ INJECT 150 MG INTO THE SKIN EVERY 14 (FOURTEEN) DAYS.  Marland Kitchen TRESIBA FLEXTOUCH 200 UNIT/ML SOPN Inject 70 Units as directed daily.   Marland Kitchen triamcinolone cream (KENALOG) 0.1 % Apply 1 application topically 2 (two) times daily as needed (DRY SKIN).    No current facility-administered medications for this visit. (Other)      REVIEW OF SYSTEMS:    ALLERGIES Allergies  Allergen Reactions  . Norvasc [Amlodipine Besylate] Other (See Comments)    Edema  . Sulfa Antibiotics Hives    PAST MEDICAL HISTORY Past Medical History:  Diagnosis Date  . Adenomatous colon polyp   . Coronary artery disease    LEXISCAN, 01/05/2011 - defect seen in Basal Inferior, Mid Inferior, and Apical Lateral region(s)-consistent with infarct/scar, global LV systolic function mildly reduced, abnormal  study  . Gastroparesis   . GERD (gastroesophageal reflux disease)   . Hyperlipidemia   . Hypertension    2D ECHO, 10/10/2010 - EF >55%, normal  . Insulin dependent diabetes mellitus   . Pseudoaneurysm ([place])    LEA DOPPLER, 11/01/2010 - right groin hematoma measuring 2.47x0.62x1.61 centimeters in greatest diameter   Past Surgical History:  Procedure Laterality Date  . CARDIAC CATHETERIZATION  10/24/2010   CTI - 3030 Resolute drug-eluting stent, 3.86mm resulting reduction og a long CT to 0% residual with excellent flow.  Marland Kitchen CARDIOVERSION N/A 12/02/2017   Procedure: CARDIOVERSION;  Surgeon: Larey Dresser, MD;  Location[facility] ENDOSCOPY;  Service: Cardiovascular;  Laterality: N/A;    FAMILY  HISTORY Family History  Problem Relation Age of Onset  . Heart disease Mother 57       Stent placement  . Diabetes Mother   . Diabetes Sister   . Thyroid cancer Brother        2010  . Prostate cancer Maternal Grandfather   . Emphysema Paternal Grandfather     SOCIAL HISTORY Social History   Tobacco Use  . Smoking status: Never Smoker  . Smokeless tobacco: Never Used  Vaping Use  . Vaping Use: Never used  Substance Use Topics  . Alcohol use: Yes    Comment: occassionally  . Drug use: No         OPHTHALMIC EXAM:  Base Eye Exam    Visual Acuity (ETDRS)      Right Left   Dist cc 20/20 20/20   Correction: Glasses       Tonometry (Tonopen, 3:40 PM)      Right Left   Pressure 12 15       Pupils      Pupils Dark Light Shape React APD   Right PERRL 4 3 Round Brisk None   Left PERRL 4 3 Round Brisk None       Visual Fields (Counting fingers)      Left Right    Full Full       Extraocular Movement      Right Left    Full Full       Neuro/Psych    Oriented x3: Yes   Mood/Affect: Normal       Dilation    Both eyes: 1.0% Mydriacyl, 2.5% Phenylephrine @ 3:40 PM        Slit Lamp and Fundus Exam    External Exam      Right Left   External Normal Normal       Slit Lamp Exam      Right Left   Lids/Lashes Normal Normal   Conjunctiva/Sclera White and quiet White and quiet   Cornea Clear Clear   Anterior Chamber Deep and quiet Deep and quiet   Iris Round and reactive Round and reactive   Lens Trace Nuclear sclerosis Trace Nuclear sclerosis   Anterior Vitreous Normal Normal       Fundus Exam      Right Left   Posterior Vitreous Normal Normal   Disc Normal Normal   C/D Ratio 0.45 0.5   Macula Microaneurysms, Mild clinically significant macular edema, temporal in the macular region, Macular thickening, no exudates no macular thickening, Microaneurysms, no exudates   Vessels NPDR-Severe, now only moderate to severe NPDR-Severe, now only moderate to  severe   Periphery Mild scatter treatment Normal          IMAGING AND PROCEDURES  Imaging and Procedures for 07/18/20  OCT, Retina - OU - Both Eyes       Right Eye Quality was good. Scan locations included subfoveal. Central Foveal Thickness: 268. Progression has improved. Findings include normal foveal contour.   Left Eye Quality was good. Scan locations included subfoveal. Central Foveal Thickness: 265. Progression has been stable. Findings include normal foveal contour.   Notes Focal laser photocoagulation changes superotemporal to the fovea, much less retinal thickening will observe,  No active maculopathy in either eye.  Old changes the outer retina temporally OD, no active leakages at this time.                ASSESSMENT/PLAN:  Obstructive sleep apnea Patient remains compliant with CPAP  Nuclear sclerotic cataract of right eye Mild cataract opacity no impact on acuity observe      ICD-10-CM   1. Severe nonproliferative diabetic retinopathy of right eye, with macular edema, associated with type 2 diabetes mellitus (HCC)  E11.3411 OCT, Retina - OU - Both Eyes  2. Severe nonproliferative diabetic retinopathy of left eye, with macular edema, associated with type 2 diabetes mellitus (HCC)  W40.9735 OCT, Retina - OU - Both Eyes  3. Obstructive sleep apnea  G47.33   4. Nuclear sclerotic cataract of right eye  H25.11     1.  No active disease at this time.  Stable over time.  Concomitantly patient has improved blood sugar control as well as loss of body weight.  2.  Patient does have obstructive sleep apnea and remains on CPAP  3.  Ophthalmic Meds Ordered this visit:  No orders of the defined types were placed in this encounter.      Return in about 9 months (around 04/20/2021) for DILATE OU, OCT.  There are no Patient Instructions on file for this visit.   Explained the diagnoses, plan, and follow up with the patient and they expressed understanding.   Patient expressed understanding of the importance of proper follow up care.   Clent Demark Janaiah Vetrano M.D. Diseases & Surgery of the Retina and Vitreous Retina & Diabetic [place] 07/18/20     Abbreviations: M myopia (nearsighted); A astigmatism; H hyperopia (farsighted); P presbyopia; Mrx spectacle prescription;  CTL contact lenses; OD right eye; OS left eye; OU both eyes  XT exotropia; ET esotropia; PEK punctate epithelial keratitis; PEE punctate epithelial erosions; DES dry eye syndrome; MGD meibomian gland dysfunction; ATs artificial tears; PFAT's preservative free artificial tears; [place] nuclear sclerotic cataract; PSC posterior subcapsular cataract; ERM epi-retinal membrane; PVD posterior vitreous detachment; RD retinal detachment; DM diabetes mellitus; DR diabetic retinopathy; NPDR non-proliferative diabetic retinopathy; PDR proliferative diabetic retinopathy; CSME clinically significant macular edema; DME diabetic macular edema; dbh dot blot hemorrhages; CWS cotton wool spot; POAG primary open angle glaucoma; C/D cup-to-disc ratio; HVF humphrey visual field; GVF goldmann visual field; OCT optical coherence tomography; IOP intraocular pressure; BRVO Branch retinal vein occlusion; CRVO central retinal vein occlusion; CRAO central retinal artery occlusion; BRAO branch retinal artery occlusion; RT retinal tear; SB scleral buckle; PPV pars plana vitrectomy; VH Vitreous hemorrhage; PRP panretinal laser photocoagulation; IVK intravitreal kenalog; VMT vitreomacular traction; MH Macular hole;  NVD neovascularization of the disc; NVE neovascularization elsewhere; AREDS age related eye disease study; ARMD age related macular degeneration; POAG primary open angle glaucoma; EBMD epithelial/anterior basement membrane dystrophy; ACIOL anterior chamber intraocular lens; IOL intraocular lens; PCIOL posterior chamber intraocular lens; Phaco/IOL phacoemulsification with intraocular lens placement; [place] photorefractive  keratectomy; LASIK laser assisted in situ keratomileusis; HTN hypertension; DM diabetes mellitus; COPD  chronic obstructive pulmonary disease

## 2020-07-18 NOTE — Assessment & Plan Note (Signed)
- 

## 2020-08-12 ENCOUNTER — Other Ambulatory Visit: Payer: Self-pay | Admitting: Cardiovascular Disease

## 2020-10-03 ENCOUNTER — Ambulatory Visit: Payer: BC Managed Care – PPO | Admitting: Adult Health

## 2020-10-10 ENCOUNTER — Ambulatory Visit: Payer: BC Managed Care – PPO | Admitting: Adult Health

## 2020-10-10 ENCOUNTER — Encounter: Payer: Self-pay | Admitting: Adult Health

## 2020-10-10 VITALS — BP 155/94 | HR 76 | Ht 66.0 in | Wt 205.0 lb

## 2020-10-10 DIAGNOSIS — G4733 Obstructive sleep apnea (adult) (pediatric): Secondary | ICD-10-CM

## 2020-10-10 DIAGNOSIS — Z9989 Dependence on other enabling machines and devices: Secondary | ICD-10-CM

## 2020-10-10 NOTE — Progress Notes (Addendum)
PATIENT: Bradley Holmes DOB: February 17, 1961  REASON FOR VISIT: follow up HISTORY FROM: patient Primary neurologist: Dr. Rexene Alberts  HISTORY OF PRESENT ILLNESS: Today 10/10/20:  Bradley Holmes is a 60 year old male with a history of obstructive sleep apnea on CPAP.  He reports that the CPAP continues to work well for him.  He has lost approximately 60 pounds since the last visit.  His goal is to lose 10-20 more pounds.  His download is below.  09/30/19: Bradley Holmes is a 60 year old male with a history of obstructive sleep apnea on CPAP.  His download indicates that he uses machine nightly for compliance of 100%.  He uses machine greater than 4 hours each night.  On average he uses his machine 6 hours and 2 minutes.  His residual AHI is 1.8 on 12 cm of water with EPR 1.  Leak in the 95th percentile is 34.9 L/min.  He reports that the CPAP is working well for him.  He denies any new issues.  He returns today for an evaluation.  HISTORY  09/29/2018: I reviewed his CPAP compliance data from 08/26/2018 through 09/24/2018 which is a total of 30 days, during which time he used his CPAP every night with percent used days greater than 4 hours at 97%, indicating excellent compliance with an average usage of 6 hours and 15 minutes, residual AHI at goal at 1.1/h, leak on the higher side with a 95th percentile at 28.5 L/min on a pressure of 12 cm.     He reports Being compliant with his CPAP, he has no new issues with this, uses a nasal mask.  He has had some intermittent shortness of breath, had a cardiology checkup earlier this month, was noted to have PVCs and was referred to EP, has an appointment with Dr. Crissie Sickles pending next week.  He also did a 3-day heart monitor which he mailed back About a week ago, he has not heard about the results.He denies any chest pain, he currently denies any shortness of breath, feels a little anxious about the irregularities in his heart rate. He Has a blood pressure monitor and a  pulse ox at home and had low readings at home, but When he counted his pulse it was higher.  He noticed some irregularities in his own pulse rate. Of note, since his last cardiology appointment with the PA he has completely discontinued his soda intake.  He would drink up to 2 L of soda per day and has abruptly stopped it, he tries to hydrate well with water.   The patient's allergies, current medications, family history, past medical history, past social history, past surgical history and problem list were reviewed and updated as appropriate.   REVIEW OF SYSTEMS: Out of a complete 14 system review of symptoms, the patient complains only of the following symptoms, and all other reviewed systems are negative.   ESS 7  ALLERGIES: Allergies  Allergen Reactions   Norvasc [Amlodipine Besylate] Other (See Comments)    Edema   Sulfa Antibiotics Hives    HOME MEDICATIONS: Outpatient Medications Prior to Visit  Medication Sig Dispense Refill   amLODipine (NORVASC) 10 MG tablet Take 5 mg by mouth daily.     aspirin EC 81 MG tablet Take 1 tablet (81 mg total) by mouth daily. (Patient taking differently: Take 81 mg by mouth every evening.) 90 tablet 3   BD PEN NEEDLE NANO U/F 32G X 4 MM MISC      BYSTOLIC 10  MG tablet TAKE 1 TABLET EVERY EVENING (Patient taking differently: Take 5 mg by mouth every evening.) 30 tablet 2   Coenzyme Q10 (CO Q-10) 200 MG CAPS Take 200 mg by mouth daily.      dapagliflozin propanediol (FARXIGA) 10 MG TABS tablet Take 10 mg by mouth daily.     nitroGLYCERIN (NITROSTAT) 0.4 MG SL tablet PLACE 1 TABLET UNDER THE TONGUE EVERY 5 MINUTES AS NEEDED FOR CHEST PAIN 25 tablet 1   omeprazole (PRILOSEC) 20 MG capsule Take 20 mg by mouth in the morning and at bedtime.      ONE TOUCH ULTRA TEST test strip      ONETOUCH DELICA LANCETS 99991111 MISC Apply 1 strip topically 3 (three) times daily.  8   PRALUENT 150 MG/ML SOAJ INJECT 150 MG INTO THE SKIN EVERY 14 (FOURTEEN) DAYS. 150 mL 3    Semaglutide (OZEMPIC, 1 MG/DOSE, [place]) Inject into the skin.     TRESIBA FLEXTOUCH 200 UNIT/ML SOPN Inject 70 Units as directed daily.   6   triamcinolone cream (KENALOG) 0.1 % Apply 1 application topically 2 (two) times daily as needed (DRY SKIN).      XARELTO 20 MG TABS tablet Take 20 mg by mouth daily.     apixaban (ELIQUIS) 5 MG TABS tablet Take 5 mg by mouth 2 (two) times daily.     fluticasone (FLONASE) 50 MCG/ACT nasal spray Place 2 sprays into both nostrils at bedtime.     Insulin Aspart (NOVOLOG FLEXPEN [place]) Inject 10 Units into the skin 3 (three) times daily with meals.      No facility-administered medications prior to visit.    PAST MEDICAL HISTORY: Past Medical History:  Diagnosis Date   Adenomatous colon polyp    Coronary artery disease    LEXISCAN, 01/05/2011 - defect seen in Basal Inferior, Mid Inferior, and Apical Lateral region(s)-consistent with infarct/scar, global LV systolic function mildly reduced, abnormal study   Gastroparesis    GERD (gastroesophageal reflux disease)    Hyperlipidemia    Hypertension    2D ECHO, 10/10/2010 - EF >55%, normal   Insulin dependent diabetes mellitus    Pseudoaneurysm ([place])    LEA DOPPLER, 11/01/2010 - right groin hematoma measuring 2.47x0.62x1.61 centimeters in greatest diameter    PAST SURGICAL HISTORY: Past Surgical History:  Procedure Laterality Date   CARDIAC CATHETERIZATION  10/24/2010   CTI - 3030 Resolute drug-eluting stent, 3.57m resulting reduction og a long CT to 0% residual with excellent flow.   CARDIOVERSION N/A 12/02/2017   Procedure: CARDIOVERSION;  Surgeon: MLarey Dresser MD;  [facility];  Service: Cardiovascular;  Laterality: N/A;    FAMILY HISTORY: Family History  Problem Relation Age of Onset   Heart disease Mother 643      Stent placement   Diabetes Mother    Diabetes Sister    Thyroid cancer Brother        2010   Prostate cancer Maternal Grandfather    Emphysema Paternal Grandfather      SOCIAL HISTORY: Social History   Socioeconomic History   Marital status: Single    Spouse name: Not on file   Number of children: Not on file   Years of education: Not on file   Highest education level: Not on file  Occupational History   Occupation: DPassenger transport managerHousing  Tobacco Use   Smoking status: Never   Smokeless tobacco: Never  Vaping Use   Vaping Use: Never used  Substance and Sexual  Activity   Alcohol use: Yes    Comment: occassionally   Drug use: No   Sexual activity: Not on file  Other Topics Concern   Not on file  Social History Narrative   Not on file   Social Determinants of Health   Financial Resource Strain: Not on file  Food Insecurity: Not on file  Transportation Needs: Not on file  Physical Activity: Not on file  Stress: Not on file  Social Connections: Not on file  Intimate Partner Violence: Not on file      PHYSICAL EXAM  Vitals:   10/10/20 1440  BP: (!) 155/94  Pulse: 76  Weight: 205 lb (93 kg)  Height: '5\' 6"'$  (1.676 m)   Body mass index is 33.09 kg/m.  Generalized: Well developed, in no acute distress  Chest: Lungs clear to auscultation bilaterally  Neurological examination  Mentation: Alert oriented to time, place, history taking. Follows all commands speech and language fluent Cranial nerve II-XII: Extraocular movements were full, visual field were full on confrontational test Head turning and shoulder shrug  were normal and symmetric.  Neck circumference 16 inches--previously 18 inches at the time of his sleep study Motor: The motor testing reveals 5 over 5 strength of all 4 extremities. Good symmetric motor tone is noted throughout.  Sensory: Sensory testing is intact to soft touch on all 4 extremities. No evidence of extinction is noted.  Gait and station: Gait is normal.    DIAGNOSTIC DATA (LABS, IMAGING, TESTING) - I reviewed patient records, labs, notes, testing and imaging myself where available.  Lab Results   Component Value Date   WBC 7.8 12/04/2019   HGB 15.6 12/04/2019   HCT 49.4 12/04/2019   MCV 95.7 12/04/2019   PLT 187 12/04/2019      Component Value Date/Time   NA 134 (L) 12/05/2019 1250   NA 141 11/27/2017 0830   K 5.3 (H) 12/05/2019 1250   CL 102 12/05/2019 1250   CO2 21 (L) 12/05/2019 1250   GLUCOSE 133 (H) 12/05/2019 1250   BUN 57 (H) 12/05/2019 1250   BUN 25 (H) 11/27/2017 0830   CREATININE 1.89 (H) 12/05/2019 1250   CALCIUM 9.5 12/05/2019 1250   PROT 7.4 12/04/2019 1903   ALBUMIN 4.3 12/04/2019 1903   AST 45 (H) 12/04/2019 1903   ALT 56 (H) 12/04/2019 1903   ALKPHOS 63 12/04/2019 1903   BILITOT 1.0 12/04/2019 1903   GFRNONAA 38 (L) 12/05/2019 1250   GFRAA 44 (L) 12/05/2019 1250      ASSESSMENT AND PLAN 60 y.o. year old male  has a past medical history of Adenomatous colon polyp, Coronary artery disease, Gastroparesis, GERD (gastroesophageal reflux disease), Hyperlipidemia, Hypertension, Insulin dependent diabetes mellitus, and Pseudoaneurysm ([place]). here with:  OSA on CPAP  - CPAP compliance excellent - Good treatment of AHI  - Encourage patient to use CPAP nightly and > 4 hours each night -Discussed repeating home sleep test however the patient would like to wait until he loses more weight - F/U in 1 year or sooner if needed   I spent 30 minutes of face-to-face and non-face-to-face time with patient.  This included previsit chart review, reviewing previous sleep study, reviewing current CPAP report and discussing repeating home sleep test  Ward Givens, MSN, NP-C 10/10/2020, 2:51 PM [facility] [address] [place], [place] [address] [phone]  I reviewed the above note and documentation by the Nurse Practitioner and agree with the history, exam, assessment and  plan as outlined above. I was available for consultation. [name], MD, PhD [facility])

## 2020-10-10 NOTE — Patient Instructions (Signed)
Continue using CPAP nightly and greater than 4 hours each night Can repeat home sleep test in the future after weight loss If your symptoms worsen or you develop new symptoms please let us know.

## 2020-12-20 ENCOUNTER — Ambulatory Visit: Payer: BC Managed Care – PPO | Admitting: Cardiovascular Disease

## 2020-12-21 ENCOUNTER — Other Ambulatory Visit: Payer: Self-pay | Admitting: Cardiovascular Disease

## 2021-03-11 ENCOUNTER — Other Ambulatory Visit: Payer: Self-pay | Admitting: Cardiovascular Disease

## 2021-03-14 ENCOUNTER — Ambulatory Visit: Payer: BC Managed Care – PPO | Admitting: Cardiovascular Disease

## 2021-03-14 ENCOUNTER — Other Ambulatory Visit: Payer: Self-pay

## 2021-03-14 ENCOUNTER — Encounter: Payer: Self-pay | Admitting: Cardiovascular Disease

## 2021-03-14 DIAGNOSIS — I2510 Atherosclerotic heart disease of native coronary artery without angina pectoris: Secondary | ICD-10-CM

## 2021-03-14 DIAGNOSIS — E785 Hyperlipidemia, unspecified: Secondary | ICD-10-CM | POA: Diagnosis not present

## 2021-03-14 DIAGNOSIS — I4819 Other persistent atrial fibrillation: Secondary | ICD-10-CM

## 2021-03-14 DIAGNOSIS — G4733 Obstructive sleep apnea (adult) (pediatric): Secondary | ICD-10-CM | POA: Diagnosis not present

## 2021-03-14 NOTE — Assessment & Plan Note (Signed)
History of CAD status post cardiac catheterization which I performed 10/24/2010 revealing an occluded circumflex in the mid AV groove, 50% mid LAD stenosis and proximal PDA stenosis with normal LV function.  I ultimately was able to cross his circumflex CTO and stented him with a resolute 3 mm x 30 mm long drug-eluting stent.  Follow-up Myoview showed improved perfusion to his lateral wall.  He denies ischemic chest pain.

## 2021-03-14 NOTE — Progress Notes (Addendum)
04/14/2021 Bradley Holmes   1960-09-09  [id number]  Primary Physician [name], MD Primary Cardiologist: [name] MD [name], [place]  HPI:  Bradley Holmes is a 61 y.o.  mildly overweight single Caucasian male with no children whom I last saw in the 10/14/2018. 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 He was initially referred because of an abnormal EKG with risk factors that included diabetes, hypertension and hyperlipidemia as well as family history. He was getting some chest pain and dyspnea. His EKG showed poor R-wave progression with Q-waves, a left anterior fascicular block. Myoview stress test showed inferolateral scar with mild peri/infarct ischemia, and because of this I performed cardiac catheterization on him 10/24/2010 revealing an occluded circumflex in the mid AV groove, 50% mid LAD and proximal PDA stenosis with normal LV function. I was ultimately able to cross his chronically occluded circumflex and stented him with a resolute 3.0 x 30 drug-eluting stent. Since that time, he had done well. A followup Myoview showed improved perfusion to the lateral wall.   Because of statin intolerance he is beginning Repatha which resulted in marked improvement in his lipid profile.  Since I saw him in November 2018 he is noticed increasing dyspnea on exertion this past spring.  He saw his PCP recently he noticed that his heart rate was irregular and he was diagnosed with A. fib and begun on Eliquis oral anticoagulation.   I arranged for him to undergo DC cardioversion by Dr. Aundra Dubin 12/02/2017 which was performed successfully.  He remains on Eliquis oral anticoagulation.  He does have obstructive sleep apnea on CPAP.     Since I saw him in the office over a year ago he has done well.  He is working out at [employer] doing upper and lower extremity weights as well as aerobic exercise.  Does complain of  some atypical musculoskeletal type left chest discomfort which is not similar to his prior cardiac chest pain..   Current Meds  Medication Sig   amLODipine (NORVASC) 10 MG tablet Take 5 mg by mouth daily.   aspirin EC 81 MG tablet Take 1 tablet (81 mg total) by mouth daily. (Patient taking differently: Take 81 mg by mouth every evening.)   BD PEN NEEDLE NANO U/F 32G X 4 MM MISC    Coenzyme Q10 (CO Q-10) 200 MG CAPS Take 200 mg by mouth daily.    dapagliflozin propanediol (FARXIGA) 10 MG TABS tablet Take 10 mg by mouth daily.   ELIQUIS 5 MG TABS tablet Take 5 mg by mouth 2 (two) times daily.   nebivolol (BYSTOLIC) 10 MG tablet every evening.   nitroGLYCERIN (NITROSTAT) 0.4 MG SL tablet PLACE 1 TABLET UNDER THE TONGUE EVERY 5 MINUTES AS NEEDED FOR CHEST PAIN   omeprazole (PRILOSEC) 20 MG capsule Take 20 mg by mouth in the morning and at bedtime.    ONE TOUCH ULTRA TEST test strip    ONETOUCH DELICA LANCETS 12W MISC Apply 1 strip topically 3 (three) times daily.   Semaglutide (OZEMPIC, 1 MG/DOSE, [place]) Inject into the skin.   TRESIBA FLEXTOUCH 200 UNIT/ML SOPN Inject 10 Units as directed daily.   triamcinolone cream (KENALOG) 0.1 % Apply 1 application topically 2 (two) times daily as needed (DRY SKIN).    [DISCONTINUED] Alirocumab (PRALUENT) 150 MG/ML SOAJ INJECT 150 MG INTO THE SKIN EVERY 14 (FOURTEEN) DAYS.     Allergies  Allergen Reactions   Norvasc [Amlodipine Besylate] Other (See Comments)    Edema   Sulfa Antibiotics Hives    Social History   Socioeconomic History   Marital status: Single    Spouse name: Not on file   Number of children: Not on file   Years of education: Not on file   Highest education level: Not on file  Occupational History   Occupation: Passenger transport manager Housing  Tobacco Use   Smoking status: Never   Smokeless tobacco: Never  Vaping Use   Vaping Use: Never used  Substance and Sexual Activity   Alcohol use: Yes    Comment: occassionally   Drug use:  No   Sexual activity: Not on file  Other Topics Concern   Not on file  Social History Narrative   Not on file   Social Determinants of Health   Financial Resource Strain: Not on file  Food Insecurity: Not on file  Transportation Needs: Not on file  Physical Activity: Not on file  Stress: Not on file  Social Connections: Not on file  Intimate Partner Violence: Not on file     Review of Systems: General: negative for chills, fever, night sweats or weight changes.  Cardiovascular: negative for chest pain, dyspnea on exertion, edema, orthopnea, palpitations, paroxysmal nocturnal dyspnea or shortness of breath Dermatological: negative for rash Respiratory: negative for cough or wheezing Urologic: negative for hematuria Abdominal: negative for nausea, vomiting, diarrhea, bright red blood per rectum, melena, or hematemesis Neurologic: negative for visual changes, syncope, or dizziness All other systems reviewed and are otherwise negative except as noted above.    Blood pressure (!) 156/92, pulse 64, height 5\' 7"  (1.702 m), weight 201 lb (91.2 kg), SpO2 99 %.  General appearance: alert and no distress Neck: no adenopathy, no carotid bruit, no JVD, supple, symmetrical, trachea midline, and thyroid not enlarged, symmetric, no tenderness/mass/nodules Lungs: clear to auscultation bilaterally Heart: irregularly irregular rhythm Extremities: extremities normal, atraumatic, no cyanosis or edema Pulses: 2+ and symmetric Skin: Skin color, texture, turgor normal. No rashes or lesions Neurologic: Grossly normal  EKG atrial fibrillation with a ventricular sponsor of 64, left axis deviation with inferior and anterior Q waves.  I personally reviewed this EKG.  ASSESSMENT AND PLAN:   Coronary artery disease History of CAD status post cardiac catheterization which I performed 10/24/2010 revealing an occluded circumflex in the mid AV groove, 50% mid LAD stenosis and proximal PDA stenosis with  normal LV function.  I ultimately was able to cross his circumflex CTO and stented him with a resolute 3 mm x 30 mm long drug-eluting stent.  Follow-up Myoview showed improved perfusion to his lateral wall.  He denies ischemic chest pain.  Essential hypertension History of essential hypertension with blood pressure measured today 156/92.  He is on amlodipine and Bystolic.  Dyslipidemia History of dyslipidemia on Praluent with lipid profile performed 02/19/2020 revealing a total cholesterol of 95, LDL 47 and HDL 31.  This is followed by his PCP.  His most recent lipid profile performed 03/21/2021 revealed total cholesterol of 85, LDL 40 and HDL 32.  Persistent atrial fibrillation (HCC) History of persistent A. fib rate controlled on Eliquis oral anticoagulation.  Obstructive sleep apnea History of obstructive sleep apnea on CPAP.     [name] MD FACP,FACC,FAHA, [facility] 04/14/2021 4:34 PM

## 2021-03-14 NOTE — Assessment & Plan Note (Addendum)
History of dyslipidemia on Praluent with lipid profile performed 02/19/2020 revealing a total cholesterol of 95, LDL 47 and HDL 31.  This is followed by his PCP.  His most recent lipid profile performed 03/21/2021 revealed total cholesterol of 85, LDL 40 and HDL 32.

## 2021-03-14 NOTE — Assessment & Plan Note (Signed)
History of obstructive sleep apnea on CPAP. 

## 2021-03-14 NOTE — Patient Instructions (Signed)
Medication Instructions:  ?Your physician recommends that you continue on your current medications as directed. Please refer to the Current Medication list given to you today. ? ?*If you need a refill on your cardiac medications before your next appointment, please call your pharmacy* ? ? ?Follow-Up: ?At [facility], you and your health needs are our priority.  As part of our continuing mission to provide you with exceptional heart care, we have created designated Provider Care Teams.  These Care Teams include your primary Cardiologist (physician) and Advanced Practice Providers (APPs -  Physician Assistants and Nurse Practitioners) who all work together to provide you with the care you need, when you need it. ? ?We recommend signing up for the patient portal called "MyChart".  Sign up information is provided on this After Visit Summary.  MyChart is used to connect with patients for Virtual Visits (Telemedicine).  Patients are able to view lab/test results, encounter notes, upcoming appointments, etc.  Non-urgent messages can be sent to your provider as well.   ?To learn more about what you can do with MyChart, go to [email or web address].   ? ?Your next appointment:   ?6 month(s) ? ?The format for your next appointment:   ?In Person ? ?Provider:   ?Jesse Cleaver, FNP, Angela Duke, PA-C, Callie Goodrich, PA-C, Jennifer, Lambert, PA-C, Kathryn Lawrence, DNP, ANP, or Hao Meng, PA-C     ? ? ?Then, Jonathan Berry, MD will plan to see you again in 12 month(s).  ?

## 2021-03-14 NOTE — Assessment & Plan Note (Signed)
History of essential hypertension with blood pressure measured today 156/92.  He is on amlodipine and Bystolic.

## 2021-03-14 NOTE — Assessment & Plan Note (Signed)
History of persistent A-fib rate controlled on Eliquis oral anticoagulation. 

## 2021-04-20 ENCOUNTER — Encounter (INDEPENDENT_AMBULATORY_CARE_PROVIDER_SITE_OTHER): Payer: BC Managed Care – PPO | Admitting: Ophthalmology

## 2021-05-04 ENCOUNTER — Encounter (INDEPENDENT_AMBULATORY_CARE_PROVIDER_SITE_OTHER): Payer: Self-pay | Admitting: Ophthalmology

## 2021-05-04 ENCOUNTER — Other Ambulatory Visit: Payer: Self-pay

## 2021-05-04 ENCOUNTER — Ambulatory Visit (INDEPENDENT_AMBULATORY_CARE_PROVIDER_SITE_OTHER): Payer: BC Managed Care – PPO | Admitting: Ophthalmology

## 2021-05-04 DIAGNOSIS — E113492 Type 2 diabetes mellitus with severe nonproliferative diabetic retinopathy without macular edema, left eye: Secondary | ICD-10-CM | POA: Diagnosis not present

## 2021-05-04 DIAGNOSIS — H2511 Age-related nuclear cataract, right eye: Secondary | ICD-10-CM

## 2021-05-04 DIAGNOSIS — H2512 Age-related nuclear cataract, left eye: Secondary | ICD-10-CM

## 2021-05-04 DIAGNOSIS — E113411 Type 2 diabetes mellitus with severe nonproliferative diabetic retinopathy with macular edema, right eye: Secondary | ICD-10-CM | POA: Diagnosis not present

## 2021-05-04 DIAGNOSIS — E113413 Type 2 diabetes mellitus with severe nonproliferative diabetic retinopathy with macular edema, bilateral: Secondary | ICD-10-CM

## 2021-05-04 DIAGNOSIS — E113412 Type 2 diabetes mellitus with severe nonproliferative diabetic retinopathy with macular edema, left eye: Secondary | ICD-10-CM | POA: Diagnosis not present

## 2021-05-04 DIAGNOSIS — H2513 Age-related nuclear cataract, bilateral: Secondary | ICD-10-CM | POA: Diagnosis not present

## 2021-05-04 NOTE — Assessment & Plan Note (Signed)

## 2021-05-04 NOTE — Assessment & Plan Note (Signed)
With excellent blood sugar control, no recurrence of CSME, status post focal sometime ago.  We will repeat reevaluation again in 9 months

## 2021-05-04 NOTE — Progress Notes (Signed)
05/04/2021     CHIEF COMPLAINT Patient presents for  Chief Complaint  Patient presents with   Diabetic Retinopathy with Macular Edema      HISTORY OF PRESENT ILLNESS: Bradley Holmes is a 61 y.o. male who presents to the clinic today for:   HPI   9 mos fu ou oct. Patient states vision is stable and unchanged since last visit. Denies any new floaters or FOL.  Last edited by Laurin Coder on 05/04/2021  1:54 PM.      Referring physician: [name], MD [place],  [place] [id number]  HISTORICAL INFORMATION:   Selected notes from the [place]: No current outpatient medications on file. (Ophthalmic Drugs)   No current facility-administered medications for this visit. (Ophthalmic Drugs)   Current Outpatient Medications (Other)  Medication Sig   amLODipine (NORVASC) 10 MG tablet Take 5 mg by mouth daily.   aspirin EC 81 MG tablet Take 1 tablet (81 mg total) by mouth daily. (Patient taking differently: Take 81 mg by mouth every evening.)   BD PEN NEEDLE NANO U/F 32G X 4 MM MISC    Coenzyme Q10 (CO Q-10) 200 MG CAPS Take 200 mg by mouth daily.    dapagliflozin propanediol (FARXIGA) 10 MG TABS tablet Take 10 mg by mouth daily.   ELIQUIS 5 MG TABS tablet Take 5 mg by mouth 2 (two) times daily.   nebivolol (BYSTOLIC) 10 MG tablet every evening.   nitroGLYCERIN (NITROSTAT) 0.4 MG SL tablet PLACE 1 TABLET UNDER THE TONGUE EVERY 5 MINUTES AS NEEDED FOR CHEST PAIN   omeprazole (PRILOSEC) 20 MG capsule Take 20 mg by mouth in the morning and at bedtime.    ONE TOUCH ULTRA TEST test strip    ONETOUCH DELICA LANCETS 24M MISC Apply 1 strip topically 3 (three) times daily.   PRALUENT 150 MG/ML SOAJ INJECT 150 MG INTO THE SKIN EVERY 14 (FOURTEEN) DAYS.   Semaglutide (OZEMPIC, 1 MG/DOSE, [place]) Inject into the skin.   TRESIBA FLEXTOUCH 200 UNIT/ML SOPN Inject 10 Units as directed daily.   triamcinolone cream (KENALOG) 0.1 %  Apply 1 application topically 2 (two) times daily as needed (DRY SKIN).    No current facility-administered medications for this visit. (Other)      REVIEW OF SYSTEMS: ROS   Negative for: Constitutional, Gastrointestinal, Neurological, Skin, Genitourinary, Musculoskeletal, HENT, Endocrine, Cardiovascular, Eyes, Respiratory, Psychiatric, Allergic/Imm, Heme/Lymph Last edited by Hurman Horn, MD on 05/04/2021  2:28 PM.       ALLERGIES Allergies  Allergen Reactions   Norvasc [Amlodipine Besylate] Other (See Comments)    Edema   Sulfa Antibiotics Hives    PAST MEDICAL HISTORY Past Medical History:  Diagnosis Date   Adenomatous colon polyp    Coronary artery disease    LEXISCAN, 01/05/2011 - defect seen in Basal Inferior, Mid Inferior, and Apical Lateral region(s)-consistent with infarct/scar, global LV systolic function mildly reduced, abnormal study   Gastroparesis    GERD (gastroesophageal reflux disease)    Hyperlipidemia    Hypertension    2D ECHO, 10/10/2010 - EF >55%, normal   Insulin dependent diabetes mellitus    Pseudoaneurysm ([place])    LEA DOPPLER, 11/01/2010 - right groin hematoma measuring 2.47x0.62x1.61 centimeters in greatest diameter   Past Surgical History:  Procedure Laterality Date   CARDIAC CATHETERIZATION  10/24/2010   CTI - 3030 Resolute drug-eluting stent, 3.9mm resulting reduction og a long CT to  0% residual with excellent flow.   CARDIOVERSION N/A 12/02/2017   Procedure: CARDIOVERSION;  Surgeon: Larey Dresser, MD;  Location: [facility] ENDOSCOPY;  Service: Cardiovascular;  Laterality: N/A;    FAMILY HISTORY Family History  Problem Relation Age of Onset   Heart disease Mother 32       Stent placement   Diabetes Mother    Diabetes Sister    Thyroid cancer Brother        2010   Prostate cancer Maternal Grandfather    Emphysema Paternal Grandfather     SOCIAL HISTORY Social History   Tobacco Use   Smoking status: Never   Smokeless tobacco: Never   Vaping Use   Vaping Use: Never used  Substance Use Topics   Alcohol use: Yes    Comment: occassionally   Drug use: No         OPHTHALMIC EXAM:  Base Eye Exam     Visual Acuity (ETDRS)       Right Left   Dist [place] 20/20 -1 20/20         Tonometry (Tonopen, 1:55 PM)       Right Left   Pressure 17 19         Pupils       Pupils Dark Light APD   Right PERRL 4 3 None   Left PERRL 4 3 None         Extraocular Movement       Right Left    Full Full         Neuro/Psych     Oriented x3: Yes   Mood/Affect: Normal         Dilation     Both eyes: 1.0% Mydriacyl, 2.5% Phenylephrine @ 1:57 PM           Slit Lamp and Fundus Exam     External Exam       Right Left   External Normal Normal         Slit Lamp Exam       Right Left   Lids/Lashes Normal Normal   Conjunctiva/Sclera White and quiet White and quiet   Cornea Clear Clear   Anterior Chamber Deep and quiet Deep and quiet   Iris Round and reactive Round and reactive   Lens Trace Nuclear sclerosis Trace Nuclear sclerosis   Anterior Vitreous Normal Normal         Fundus Exam       Right Left   Posterior Vitreous Normal Normal   Disc Normal Normal   C/D Ratio 0.45 0.5   Macula Microaneurysms, Mild clinically significant macular edema, temporal in the macular region, Macular thickening, no exudates no macular thickening, Microaneurysms, no exudates   Vessels NPDR-Severe, now only moderate to severe NPDR-Severe, now only moderate to severe   Periphery Mild scatter treatment Normal            IMAGING AND PROCEDURES  Imaging and Procedures for 05/04/21  OCT, Retina - OU - Both Eyes       Right Eye Quality was good. Scan locations included subfoveal. Central Foveal Thickness: 270. Progression has improved. Findings include normal foveal contour.   Left Eye Quality was good. Scan locations included subfoveal. Central Foveal Thickness: 270. Progression has been stable.  Findings include normal foveal contour, vitreomacular adhesion .   Notes Focal laser photocoagulation changes superotemporal to the fovea, much less retinal thickening will observe,  No active maculopathy in either eye.  Old changes the  outer retina temporally OD, no active leakages at this time.             ASSESSMENT/PLAN:  Severe nonproliferative diabetic retinopathy of right eye, with macular edema, associated with type 2 diabetes mellitus ([place]) With excellent blood sugar control, no recurrence of CSME, status post focal sometime ago.  We will repeat reevaluation again in 9 months  Severe nonproliferative diabetic retinopathy of left eye ([employer]) No active maculopathy OS  Nuclear sclerotic cataract of right eye The nature of cataract was discussed with the patient as well as the elective nature of surgery. The patient was reassured that surgery at a later date does not put the patient at risk for a worse outcome. It was emphasized that the need for surgery is dictated by the patient's quality of life as influenced by the cataract. Patient was instructed to maintain close follow up with their general eye care doctor.  Nuclear sclerotic cataract of left eye The nature of cataract was discussed with the patient as well as the elective nature of surgery. The patient was reassured that surgery at a later date does not put the patient at risk for a worse outcome. It was emphasized that the need for surgery is dictated by the patient's quality of life as influenced by the cataract. Patient was instructed to maintain close follow up with their general eye care doctor.     ICD-10-CM   1. Severe nonproliferative diabetic retinopathy of right eye, with macular edema, associated with type 2 diabetes mellitus (HCC)  E11.3411 OCT, Retina - OU - Both Eyes    2. Severe nonproliferative diabetic retinopathy of left eye, with macular edema, associated with type 2 diabetes mellitus (HCC)  C16.6063 OCT,  Retina - OU - Both Eyes    3. Severe nonproliferative diabetic retinopathy of left eye without macular edema associated with type 2 diabetes mellitus ([place])  K16.0109     4. Nuclear sclerotic cataract of right eye  H25.11     5. Nuclear sclerotic cataract of left eye  H25.12       1.  OU with no change in retinopathy.  No signs of active maculopathy.  With good blood sugar control, routine visit interval of 6 months may be extended slightly to 9 months.  Similar to the current visit interval.  We will continue to monitor  2.  No signs of recurrence of CSME nor of progression of NPDR  3.  Ophthalmic Meds Ordered this visit:  No orders of the defined types were placed in this encounter.      Return in about 9 months (around 02/01/2022) for OCT.  There are no Patient Instructions on file for this visit.   Explained the diagnoses, plan, and follow up with the patient and they expressed understanding.  Patient expressed understanding of the importance of proper follow up care.   Clent Demark Domani Bakos M.D. Diseases & Surgery of the Retina and Vitreous Retina & Diabetic [place] 05/04/21     Abbreviations: M myopia (nearsighted); A astigmatism; H hyperopia (farsighted); P presbyopia; Mrx spectacle prescription;  CTL contact lenses; OD right eye; OS left eye; OU both eyes  XT exotropia; ET esotropia; PEK punctate epithelial keratitis; PEE punctate epithelial erosions; DES dry eye syndrome; MGD meibomian gland dysfunction; ATs artificial tears; PFAT's preservative free artificial tears; [place] nuclear sclerotic cataract; PSC posterior subcapsular cataract; ERM epi-retinal membrane; PVD posterior vitreous detachment; RD retinal detachment; DM diabetes mellitus; DR diabetic retinopathy; NPDR non-proliferative diabetic retinopathy; PDR proliferative  diabetic retinopathy; CSME clinically significant macular edema; DME diabetic macular edema; dbh dot blot hemorrhages; CWS cotton wool spot; POAG  primary open angle glaucoma; C/D cup-to-disc ratio; HVF humphrey visual field; GVF goldmann visual field; OCT optical coherence tomography; IOP intraocular pressure; BRVO Branch retinal vein occlusion; CRVO central retinal vein occlusion; CRAO central retinal artery occlusion; BRAO branch retinal artery occlusion; RT retinal tear; SB scleral buckle; PPV pars plana vitrectomy; VH Vitreous hemorrhage; PRP panretinal laser photocoagulation; IVK intravitreal kenalog; VMT vitreomacular traction; MH Macular hole;  NVD neovascularization of the disc; NVE neovascularization elsewhere; AREDS age related eye disease study; ARMD age related macular degeneration; POAG primary open angle glaucoma; EBMD epithelial/anterior basement membrane dystrophy; ACIOL anterior chamber intraocular lens; IOL intraocular lens; PCIOL posterior chamber intraocular lens; Phaco/IOL phacoemulsification with intraocular lens placement; [place] photorefractive keratectomy; LASIK laser assisted in situ keratomileusis; HTN hypertension; DM diabetes mellitus; COPD chronic obstructive pulmonary disease

## 2021-05-04 NOTE — Assessment & Plan Note (Signed)
No active maculopathy OS

## 2021-08-16 ENCOUNTER — Other Ambulatory Visit: Payer: Self-pay | Admitting: Cardiovascular Disease

## 2021-08-16 NOTE — Telephone Encounter (Signed)
Pt c/o medication issue:  1. Name of Medication: PRALUENT 150 MG/ML SOAJ  2. How are you currently taking this medication (dosage and times per day)? INJECT 150 MG INTO THE SKIN EVERY 14 (FOURTEEN) DAYS.  3. Are you having a reaction (difficulty breathing--STAT)? no  4. What is your medication issue? Patient was calling to let us know that the pharmacy called to say that his insurance is not longer covering the medication. Patient is calling to see what other medication can be taken.

## 2021-08-17 MED ORDER — PRALUENT 150 MG/ML ~~LOC~~ SOAJ: 150.0000 mg | SUBCUTANEOUS | 1 refills | Status: DC

## 2021-10-10 NOTE — Progress Notes (Unsigned)
PATIENT: Bradley Holmes DOB: 1960-09-15  REASON FOR VISIT: follow up HISTORY FROM: patient Primary neurologist: Dr. Frances Furbish  Chief Complaint  Patient presents with   Follow-up    Rm 19 alone here for yearly f/u on CPAP rerpots he has been doing well no concerns.      HISTORY OF PRESENT ILLNESS: Today 10/11/21:  Mr. Christison is a 61 year old male with a history of obstructive sleep apnea on CPAP.  He returns today for follow-up.  His download is below. Denies any new issues with CPAP.     10/10/20: Mr. Magrini is a 61 year old male with a history of obstructive sleep apnea on CPAP.  He reports that the CPAP continues to work well for him.  He has lost approximately 60 pounds since the last visit.  His goal is to lose 10-20 more pounds.  His download is below.  09/30/19: Mr. Schleper is a 61 year old male with a history of obstructive sleep apnea on CPAP.  His download indicates that he uses machine nightly for compliance of 100%.  He uses machine greater than 4 hours each night.  On average he uses his machine 6 hours and 2 minutes.  His residual AHI is 1.8 on 12 cm of water with EPR 1.  Leak in the 95th percentile is 34.9 L/min.  He reports that the CPAP is working well for him.  He denies any new issues.  He returns today for an evaluation.  HISTORY  09/29/2018: I reviewed his CPAP compliance data from 08/26/2018 through 09/24/2018 which is a total of 30 days, during which time he used his CPAP every night with percent used days greater than 4 hours at 97%, indicating excellent compliance with an average usage of 6 hours and 15 minutes, residual AHI at goal at 1.1/h, leak on the higher side with a 95th percentile at 28.5 L/min on a pressure of 12 cm.     He reports Being compliant with his CPAP, he has no new issues with this, uses a nasal mask.  He has had some intermittent shortness of breath, had a cardiology checkup earlier this month, was noted to have PVCs and was referred to EP, has  an appointment with Dr. Sharrell Ku pending next week.  He also did a 3-day heart monitor which he mailed back About a week ago, he has not heard about the results.He denies any chest pain, he currently denies any shortness of breath, feels a little anxious about the irregularities in his heart rate. He Has a blood pressure monitor and a pulse ox at home and had low readings at home, but When he counted his pulse it was higher.  He noticed some irregularities in his own pulse rate. Of note, since his last cardiology appointment with the PA he has completely discontinued his soda intake.  He would drink up to 2 L of soda per day and has abruptly stopped it, he tries to hydrate well with water.   The patient's allergies, current medications, family history, past medical history, past social history, past surgical history and problem list were reviewed and updated as appropriate.   REVIEW OF SYSTEMS: Out of a complete 14 system review of symptoms, the patient complains only of the following symptoms, and all other reviewed systems are negative.   ESS 8  FSS 23  ALLERGIES: Allergies  Allergen Reactions   Norvasc [Amlodipine Besylate] Other (See Comments)    Edema   Sulfa Antibiotics Hives    HOME  MEDICATIONS: Outpatient Medications Prior to Visit  Medication Sig Dispense Refill   Alirocumab (PRALUENT) 150 MG/ML SOAJ Inject 150 mg into the skin every 14 (fourteen) days. 6 mL 1   amLODipine (NORVASC) 10 MG tablet Take 5 mg by mouth daily.     aspirin EC 81 MG tablet Take 1 tablet (81 mg total) by mouth daily. (Patient taking differently: Take 81 mg by mouth every evening.) 90 tablet 3   BD PEN NEEDLE NANO U/F 32G X 4 MM MISC      Coenzyme Q10 (CO Q-10) 200 MG CAPS Take 200 mg by mouth daily.      dapagliflozin propanediol (FARXIGA) 10 MG TABS tablet Take 10 mg by mouth daily.     ELIQUIS 5 MG TABS tablet Take 5 mg by mouth 2 (two) times daily.     nebivolol (BYSTOLIC) 10 MG tablet every  evening.     nitroGLYCERIN (NITROSTAT) 0.4 MG SL tablet PLACE 1 TABLET UNDER THE TONGUE EVERY 5 MINUTES AS NEEDED FOR CHEST PAIN 25 tablet 1   omeprazole (PRILOSEC) 20 MG capsule Take 20 mg by mouth in the morning and at bedtime.      ONE TOUCH ULTRA TEST test strip      ONETOUCH DELICA LANCETS 33G MISC Apply 1 strip topically 3 (three) times daily.  8   Semaglutide (OZEMPIC, 1 MG/DOSE, ) Inject into the skin.     TRESIBA FLEXTOUCH 200 UNIT/ML SOPN Inject 10 Units as directed daily.  6   triamcinolone cream (KENALOG) 0.1 % Apply 1 application topically 2 (two) times daily as needed (DRY SKIN).      No facility-administered medications prior to visit.    PAST MEDICAL HISTORY: Past Medical History:  Diagnosis Date   Adenomatous colon polyp    Coronary artery disease    LEXISCAN, 01/05/2011 - defect seen in Basal Inferior, Mid Inferior, and Apical Lateral region(s)-consistent with infarct/scar, global LV systolic function mildly reduced, abnormal study   Gastroparesis    GERD (gastroesophageal reflux disease)    Hyperlipidemia    Hypertension    2D ECHO, 10/10/2010 - EF >55%, normal   Insulin dependent diabetes mellitus    Pseudoaneurysm (HCC)    LEA DOPPLER, 11/01/2010 - right groin hematoma measuring 2.47x0.62x1.61 centimeters in greatest diameter    PAST SURGICAL HISTORY: Past Surgical History:  Procedure Laterality Date   CARDIAC CATHETERIZATION  10/24/2010   CTI - 3030 Resolute drug-eluting stent, 3.61mm resulting reduction og a long CT to 0% residual with excellent flow.   CARDIOVERSION N/A 12/02/2017   Procedure: CARDIOVERSION;  Surgeon: Laurey Morale, MD;  Location: [facility] ENDOSCOPY;  Service: Cardiovascular;  Laterality: N/A;    FAMILY HISTORY: Family History  Problem Relation Age of Onset   Heart disease Mother 39       Stent placement   Diabetes Mother    Diabetes Sister    Thyroid cancer Brother        2010   Prostate cancer Maternal Grandfather    Emphysema  Paternal Grandfather     SOCIAL HISTORY: Social History   Socioeconomic History   Marital status: Single    Spouse name: Not on file   Number of children: Not on file   Years of education: Not on file   Highest education level: Not on file  Occupational History   Occupation: Artist Housing  Tobacco Use   Smoking status: Never   Smokeless tobacco: Never  Vaping Use   Vaping Use: Never  used  Substance and Sexual Activity   Alcohol use: Yes    Comment: occassionally   Drug use: No   Sexual activity: Not on file  Other Topics Concern   Not on file  Social History Narrative   Not on file   Social Determinants of Health   Financial Resource Strain: Not on file  Food Insecurity: Not on file  Transportation Needs: Not on file  Physical Activity: Not on file  Stress: Not on file  Social Connections: Not on file  Intimate Partner Violence: Not on file      PHYSICAL EXAM  Vitals:   10/11/21 1415  BP: 126/80  Pulse: 67  Weight: 207 lb (93.9 kg)  Height: 5\' 7"  (1.702 m)   Body mass index is 32.42 kg/m.  Generalized: Well developed, in no acute distress  Chest: Lungs clear to auscultation bilaterally  Neurological examination  Mentation: Alert oriented to time, place, history taking. Follows all commands speech and language fluent Cranial nerve II-XII: Extraocular movements were full, visual field were full on confrontational test Head turning and shoulder shrug  were normal and symmetric.  Neck circumference 16.5 inches, Mallampati 3+ Gait and station: Gait is normal.    DIAGNOSTIC DATA (LABS, IMAGING, TESTING) - I reviewed patient records, labs, notes, testing and imaging myself where available.  Lab Results  Component Value Date   WBC 7.8 12/04/2019   HGB 15.6 12/04/2019   HCT 49.4 12/04/2019   MCV 95.7 12/04/2019   PLT 187 12/04/2019      Component Value Date/Time   NA 134 (L) 12/05/2019 1250   NA 141 11/27/2017 0830   K 5.3 (H)  12/05/2019 1250   CL 102 12/05/2019 1250   CO2 21 (L) 12/05/2019 1250   GLUCOSE 133 (H) 12/05/2019 1250   BUN 57 (H) 12/05/2019 1250   BUN 25 (H) 11/27/2017 0830   CREATININE 1.89 (H) 12/05/2019 1250   CALCIUM 9.5 12/05/2019 1250   PROT 7.4 12/04/2019 1903   ALBUMIN 4.3 12/04/2019 1903   AST 45 (H) 12/04/2019 1903   ALT 56 (H) 12/04/2019 1903   ALKPHOS 63 12/04/2019 1903   BILITOT 1.0 12/04/2019 1903   GFRNONAA 38 (L) 12/05/2019 1250   GFRAA 44 (L) 12/05/2019 1250      ASSESSMENT AND PLAN 61 y.o. year old male  has a past medical history of Adenomatous colon polyp, Coronary artery disease, Gastroparesis, GERD (gastroesophageal reflux disease), Hyperlipidemia, Hypertension, Insulin dependent diabetes mellitus, and Pseudoaneurysm (HCC). here with:  OSA on CPAP  - CPAP compliance excellent - Good treatment of AHI  - Encourage patient to use CPAP nightly and > 4 hours each night - F/U in 1 year or sooner if needed    Butch Penny, MSN, NP-C 10/11/2021, 2:26 PM [facility] [address] [place], [place] [address] [phone]

## 2021-10-11 ENCOUNTER — Encounter: Payer: Self-pay | Admitting: Adult Health

## 2021-10-11 ENCOUNTER — Ambulatory Visit: Payer: BC Managed Care – PPO | Admitting: Adult Health

## 2021-10-11 VITALS — BP 126/80 | HR 67 | Ht 67.0 in | Wt 207.0 lb

## 2021-10-11 DIAGNOSIS — G4733 Obstructive sleep apnea (adult) (pediatric): Secondary | ICD-10-CM | POA: Diagnosis not present

## 2021-10-11 DIAGNOSIS — Z9989 Dependence on other enabling machines and devices: Secondary | ICD-10-CM | POA: Diagnosis not present

## 2021-10-11 NOTE — Patient Instructions (Signed)
Continue using CPAP nightly and greater than 4 hours each night °If your symptoms worsen or you develop new symptoms please let us know.  ° °

## 2021-11-02 ENCOUNTER — Other Ambulatory Visit: Payer: Self-pay | Admitting: Cardiovascular Disease

## 2021-11-22 ENCOUNTER — Other Ambulatory Visit: Payer: Self-pay | Admitting: Cardiovascular Disease

## 2021-11-28 MED ORDER — REPATHA SURECLICK 140 MG/ML ~~LOC~~ SOAJ: 1.0000 | SUBCUTANEOUS | 3 refills | Status: DC

## 2021-12-08 NOTE — Progress Notes (Signed)
Cardiology Office Note:    Date:  12/08/2021   ID:  Bradley Holmes, DOB 1960/12/01, MRN [id number]  PCP:  [name], MD   [place] Providers Cardiologist:  None { Click to update primary MD,subspecialty MD or APP then REFRESH:1}    Referring MD: [name], MD   No chief complaint on file. ***  History of Present Illness:    Bradley Holmes is a 61 y.o. male with a hx of CAD, PAF, chronic anticoagulation with Eliquis, hypertension, OSA on CPAP, and hyperlipidemia intolerant to statins.  Due to an abnormal stress, he underwent LHC 10/24/2010 with a CTO of the LCx which was treated with DES-LCX (3.0 x 30 mm).  Residual 50% disease in the mid LAD and proximal PDA stenosis was treated medically.  He had normal LV function.  Follow-up Myoview showed improved perfusion to his lateral wall.  He is intolerant to statins and therefore on Repatha.  He developed atrial fibrillation and underwent DCCV 12/02/2017.  He is anticoagulated with Eliquis.  Unfortunately he had a return of A-fib and a heart monitor that also showed 10% burden of PVCs.  He has seen EP in the past.  He is compliant with CPAP.  He remains active and exercises with weight training and aerobic exercise at stage well.  He was last seen by Dr. Gwenlyn Found in clinic on 03/14/21 and was doing well at that time.  He was in rate controlled A-fib.  He presents back for routine follow-up.    CAD DES-LCx, nonobstructive disease in the LAD, PDA Continue   Hypertension Continue amlodipine and Bystolic   Hyperlipidemia with LDL goal less than 70 Intolerant to statins, on Repatha Lipid panel 03/21/2021 showed an LDL of 40, HDL 32   Persistent atrial fibrillation Chronic anticoagulation Rate controlled on beta-blocker Oral anticoagulation with Eliquis No bleeding   OSA on CPAP Compliant   Past Medical History:  Diagnosis Date   Adenomatous colon polyp    Coronary artery disease    LEXISCAN,  01/05/2011 - defect seen in Basal Inferior, Mid Inferior, and Apical Lateral region(s)-consistent with infarct/scar, global LV systolic function mildly reduced, abnormal study   Gastroparesis    GERD (gastroesophageal reflux disease)    Hyperlipidemia    Hypertension    2D ECHO, 10/10/2010 - EF >55%, normal   Insulin dependent diabetes mellitus    Pseudoaneurysm ([facility])    LEA DOPPLER, 11/01/2010 - right groin hematoma measuring 2.47x0.62x1.61 centimeters in greatest diameter    Past Surgical History:  Procedure Laterality Date   CARDIAC CATHETERIZATION  10/24/2010   CTI - 3030 Resolute drug-eluting stent, 3.56m resulting reduction og a long CT to 0% residual with excellent flow.   CARDIOVERSION N/A 12/02/2017   Procedure: CARDIOVERSION;  Surgeon: MLarey Dresser MD;  [facility];  Service: Cardiovascular;  Laterality: N/A;    Current Medications: No outpatient medications have been marked as taking for the 12/11/21 encounter (Appointment) with DLedora Bottcher PMiller     Allergies:   Norvasc [amlodipine besylate] and Sulfa antibiotics   Social History   Socioeconomic History   Marital status: Single    Spouse name: Not on file   Number of children: Not on file   Years of education: Not on file   Highest education level: Not on file  Occupational History   Occupation: DPassenger transport managerHousing  Tobacco Use   Smoking status: Never   Smokeless tobacco: Never  Vaping Use   Vaping Use:  Never used  Substance and Sexual Activity   Alcohol use: Yes    Comment: occassionally   Drug use: No   Sexual activity: Not on file  Other Topics Concern   Not on file  Social History Narrative   Not on file   Social Determinants of Health   Financial Resource Strain: Not on file  Food Insecurity: Not on file  Transportation Needs: Not on file  Physical Activity: Not on file  Stress: Not on file  Social Connections: Not on file     Family History: The patient's  ***family history includes Diabetes in his mother and sister; Emphysema in his paternal grandfather; Heart disease (age of onset: 23) in his mother; Prostate cancer in his maternal grandfather; Thyroid cancer in his brother.  ROS:   Please see the history of present illness.    *** All other systems reviewed and are negative.  EKGs/Labs/Other Studies Reviewed:    The following studies were reviewed today: ***  EKG:  EKG is *** ordered today.  The ekg ordered today demonstrates ***  Recent Labs: No results found for requested labs within last 365 days.  Recent Lipid Panel No results found for: "CHOL", "TRIG", "HDL", "CHOLHDL", "VLDL", "LDLCALC", "LDLDIRECT"   Risk Assessment/Calculations:   {Does this patient have ATRIAL FIBRILLATION?:[id number]}  No BP recorded.  {Refresh Note OR Click here to enter BP  :1}***         Physical Exam:    VS:  There were no vitals taken for this visit.    Wt Readings from Last 3 Encounters:  10/11/21 207 lb (93.9 kg)  03/14/21 201 lb (91.2 kg)  10/10/20 205 lb (93 kg)     GEN: *** Well nourished, well developed in no acute distress HEENT: Normal NECK: No JVD; No carotid bruits LYMPHATICS: No lymphadenopathy CARDIAC: ***RRR, no murmurs, rubs, gallops RESPIRATORY:  Clear to auscultation without rales, wheezing or rhonchi  ABDOMEN: Soft, non-tender, non-distended MUSCULOSKELETAL:  No edema; No deformity  SKIN: Warm and dry NEUROLOGIC:  Alert and oriented x 3 PSYCHIATRIC:  Normal affect   ASSESSMENT:    No diagnosis found. PLAN:    In order of problems listed above:  ***      {Are you ordering a CV Procedure (e.g. stress test, cath, DCCV, TEE, etc)?   Press F2        :[id number]}    Medication Adjustments/Labs and Tests Ordered: Current medicines are reviewed at length with the patient today.  Concerns regarding medicines are outlined above.  No orders of the defined types were placed in this encounter.  No orders of the  defined types were placed in this encounter.   There are no Patient Instructions on file for this visit.   Signed, Ledora Bottcher, [place]  12/08/2021 7:11 PM    [facility]

## 2021-12-11 ENCOUNTER — Encounter: Payer: Self-pay | Admitting: Physician Assistant

## 2021-12-11 ENCOUNTER — Ambulatory Visit: Payer: BC Managed Care – PPO | Attending: Physician Assistant | Admitting: Physician Assistant

## 2021-12-11 VITALS — BP 125/72 | HR 65 | Ht 66.0 in | Wt 208.0 lb

## 2021-12-11 DIAGNOSIS — I2510 Atherosclerotic heart disease of native coronary artery without angina pectoris: Secondary | ICD-10-CM

## 2021-12-11 DIAGNOSIS — I10 Essential (primary) hypertension: Secondary | ICD-10-CM

## 2021-12-11 DIAGNOSIS — I4819 Other persistent atrial fibrillation: Secondary | ICD-10-CM | POA: Diagnosis not present

## 2021-12-11 DIAGNOSIS — E785 Hyperlipidemia, unspecified: Secondary | ICD-10-CM

## 2021-12-11 DIAGNOSIS — G4733 Obstructive sleep apnea (adult) (pediatric): Secondary | ICD-10-CM

## 2021-12-11 DIAGNOSIS — Z7901 Long term (current) use of anticoagulants: Secondary | ICD-10-CM

## 2021-12-11 NOTE — Patient Instructions (Signed)
Medication Instructions:  NONE *If you need a refill on your cardiac medications before your next appointment, please call your pharmacy*   Follow-Up: At [facility], you and your health needs are our priority.  As part of our continuing mission to provide you with exceptional heart care, we have created designated Provider Care Teams.  These Care Teams include your primary Cardiologist (physician) and Advanced Practice Providers (APPs -  Physician Assistants and Nurse Practitioners) who all work together to provide you with the care you need, when you need it.  We recommend signing up for the patient portal called "MyChart".  Sign up information is provided on this After Visit Summary.  MyChart is used to connect with patients for Virtual Visits (Telemedicine).  Patients are able to view lab/test results, encounter notes, upcoming appointments, etc.  Non-urgent messages can be sent to your provider as well.   To learn more about what you can do with MyChart, go to [email or web address].    Your next appointment:   12 month(s)  The format for your next appointment:   In Person  Provider:   Dr. Gwenlyn Found

## 2022-02-05 ENCOUNTER — Other Ambulatory Visit (HOSPITAL_BASED_OUTPATIENT_CLINIC_OR_DEPARTMENT_OTHER): Payer: Self-pay

## 2022-02-05 ENCOUNTER — Encounter (INDEPENDENT_AMBULATORY_CARE_PROVIDER_SITE_OTHER): Payer: BC Managed Care – PPO | Admitting: Ophthalmology

## 2022-02-05 MED ORDER — OZEMPIC (1 MG/DOSE) 4 MG/3ML ~~LOC~~ SOPN
1.0000 mg | PEN_INJECTOR | SUBCUTANEOUS | 2 refills | Status: DC
  Filled 2022-02-05: qty 3, 28d supply, fill #0
  Filled 2022-02-27: qty 3, 28d supply, fill #1

## 2022-02-06 ENCOUNTER — Other Ambulatory Visit (HOSPITAL_BASED_OUTPATIENT_CLINIC_OR_DEPARTMENT_OTHER): Payer: Self-pay

## 2022-03-27 ENCOUNTER — Other Ambulatory Visit (HOSPITAL_BASED_OUTPATIENT_CLINIC_OR_DEPARTMENT_OTHER): Payer: Self-pay

## 2022-03-28 ENCOUNTER — Other Ambulatory Visit (HOSPITAL_BASED_OUTPATIENT_CLINIC_OR_DEPARTMENT_OTHER): Payer: Self-pay

## 2022-03-30 ENCOUNTER — Other Ambulatory Visit (HOSPITAL_BASED_OUTPATIENT_CLINIC_OR_DEPARTMENT_OTHER): Payer: Self-pay

## 2022-03-30 MED ORDER — OZEMPIC (1 MG/DOSE) 4 MG/3ML ~~LOC~~ SOPN
1.0000 mg | PEN_INJECTOR | SUBCUTANEOUS | 3 refills | Status: DC
  Filled 2022-03-30: qty 3, 28d supply, fill #0

## 2022-04-02 ENCOUNTER — Other Ambulatory Visit (HOSPITAL_BASED_OUTPATIENT_CLINIC_OR_DEPARTMENT_OTHER): Payer: Self-pay

## 2022-04-12 ENCOUNTER — Other Ambulatory Visit (HOSPITAL_BASED_OUTPATIENT_CLINIC_OR_DEPARTMENT_OTHER): Payer: Self-pay

## 2022-04-12 MED ORDER — OZEMPIC (2 MG/DOSE) 8 MG/3ML ~~LOC~~ SOPN
2.0000 mg | PEN_INJECTOR | SUBCUTANEOUS | 5 refills | Status: DC
  Filled 2022-04-12: qty 3, 28d supply, fill #0
  Filled 2022-05-26 (×2): qty 3, 28d supply, fill #1
  Filled 2022-07-03: qty 3, 28d supply, fill #2
  Filled 2022-08-07: qty 3, 28d supply, fill #3
  Filled 2022-09-07: qty 3, 28d supply, fill #4
  Filled 2022-10-06 (×2): qty 3, 28d supply, fill #5

## 2022-05-26 ENCOUNTER — Other Ambulatory Visit (HOSPITAL_BASED_OUTPATIENT_CLINIC_OR_DEPARTMENT_OTHER): Payer: Self-pay

## 2022-05-26 ENCOUNTER — Other Ambulatory Visit: Payer: Self-pay

## 2022-07-11 ENCOUNTER — Other Ambulatory Visit: Payer: Self-pay | Admitting: Cardiovascular Disease

## 2022-10-06 ENCOUNTER — Other Ambulatory Visit (HOSPITAL_BASED_OUTPATIENT_CLINIC_OR_DEPARTMENT_OTHER): Payer: Self-pay

## 2022-10-17 ENCOUNTER — Ambulatory Visit: Payer: BC Managed Care – PPO | Admitting: Adult Health

## 2022-10-29 ENCOUNTER — Other Ambulatory Visit: Payer: Self-pay | Admitting: Cardiovascular Disease

## 2022-10-29 DIAGNOSIS — I2510 Atherosclerotic heart disease of native coronary artery without angina pectoris: Secondary | ICD-10-CM

## 2022-10-29 DIAGNOSIS — E785 Hyperlipidemia, unspecified: Secondary | ICD-10-CM

## 2022-10-31 ENCOUNTER — Encounter: Payer: Self-pay | Admitting: Adult Health

## 2022-10-31 ENCOUNTER — Ambulatory Visit: Payer: BC Managed Care – PPO | Admitting: Adult Health

## 2022-10-31 VITALS — BP 133/82 | HR 65 | Ht 67.0 in | Wt 226.0 lb

## 2022-10-31 DIAGNOSIS — G4733 Obstructive sleep apnea (adult) (pediatric): Secondary | ICD-10-CM

## 2022-10-31 NOTE — Patient Instructions (Signed)
Continue using CPAP nightly and greater than 4 hours each night °If your symptoms worsen or you develop new symptoms please let us know.  ° °

## 2022-11-14 ENCOUNTER — Other Ambulatory Visit (HOSPITAL_BASED_OUTPATIENT_CLINIC_OR_DEPARTMENT_OTHER): Payer: Self-pay

## 2022-11-14 MED ORDER — OZEMPIC (2 MG/DOSE) 8 MG/3ML ~~LOC~~ SOPN
2.0000 mg | PEN_INJECTOR | SUBCUTANEOUS | 5 refills | Status: DC
  Filled 2022-11-14: qty 3, 28d supply, fill #0
  Filled 2022-12-21: qty 3, 28d supply, fill #1
  Filled 2023-01-14: qty 3, 28d supply, fill #2
  Filled 2023-02-11: qty 3, 28d supply, fill #3
  Filled 2023-03-04 – 2023-03-18 (×2): qty 3, 28d supply, fill #4
  Filled 2023-04-12: qty 3, 28d supply, fill #5

## 2022-11-22 ENCOUNTER — Other Ambulatory Visit (HOSPITAL_BASED_OUTPATIENT_CLINIC_OR_DEPARTMENT_OTHER): Payer: Self-pay

## 2022-11-23 ENCOUNTER — Other Ambulatory Visit (HOSPITAL_BASED_OUTPATIENT_CLINIC_OR_DEPARTMENT_OTHER): Payer: Self-pay

## 2022-12-25 ENCOUNTER — Encounter (HOSPITAL_COMMUNITY): Payer: Self-pay

## 2022-12-31 ENCOUNTER — Encounter: Payer: Self-pay | Admitting: Cardiovascular Disease

## 2022-12-31 ENCOUNTER — Ambulatory Visit: Payer: BC Managed Care – PPO | Attending: Cardiovascular Disease | Admitting: Cardiovascular Disease

## 2022-12-31 VITALS — BP 120/74 | HR 70 | Ht 67.0 in | Wt 229.0 lb

## 2022-12-31 DIAGNOSIS — I493 Ventricular premature depolarization: Secondary | ICD-10-CM | POA: Diagnosis not present

## 2022-12-31 DIAGNOSIS — I4819 Other persistent atrial fibrillation: Secondary | ICD-10-CM | POA: Diagnosis not present

## 2022-12-31 DIAGNOSIS — E785 Hyperlipidemia, unspecified: Secondary | ICD-10-CM

## 2022-12-31 DIAGNOSIS — G4733 Obstructive sleep apnea (adult) (pediatric): Secondary | ICD-10-CM

## 2022-12-31 DIAGNOSIS — I10 Essential (primary) hypertension: Secondary | ICD-10-CM

## 2022-12-31 DIAGNOSIS — I2510 Atherosclerotic heart disease of native coronary artery without angina pectoris: Secondary | ICD-10-CM | POA: Diagnosis not present

## 2022-12-31 NOTE — Progress Notes (Signed)
12/31/2022 Bradley Holmes   10/13/1960  [id number]  Primary Physician Chilton Greathouse, MD Primary Cardiologist: Runell Gess MD Nicholes Calamity, [place]  HPI:  Bradley Holmes is a 62 y.o.  mildly overweight single Caucasian male with no children whom I last saw in the 04/14/2021. 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 He was initially referred because of an abnormal EKG with risk factors that included diabetes, hypertension and hyperlipidemia as well as family history. He was getting some chest pain and dyspnea. His EKG showed poor R-wave progression with Q-waves, a left anterior fascicular block. Myoview stress test showed inferolateral scar with mild peri/infarct ischemia, and because of this I performed cardiac catheterization on him 10/24/2010 revealing an occluded circumflex in the mid AV groove, 50% mid LAD and proximal PDA stenosis with normal LV function. I was ultimately able to cross his chronically occluded circumflex and stented him with a resolute 3.0 x 30 drug-eluting stent. Since that time, he had done well. A followup Myoview showed improved perfusion to the lateral wall.    Because of statin intolerance he is beginning Repatha which resulted in marked improvement in his lipid profile.  Since I saw him in November 2018 he is noticed increasing dyspnea on exertion this past spring.  He saw his PCP recently he noticed that his heart rate was irregular and he was diagnosed with A. fib and begun on Eliquis oral anticoagulation.   I arranged for him to undergo DC cardioversion by Dr. Shirlee Latch 12/02/2017 which was performed successfully.  He remains on Eliquis oral anticoagulation.  He does have obstructive sleep apnea on CPAP.     Since I saw him in the office over a year ago he has done well.  He is working out at [employer] doing upper and lower extremity weights as well as aerobic exercise but not as much  as he was a year ago..  Does complain of some atypical epigastric pain which is not similar to his prior symptoms.   Current Meds  Medication Sig   amLODipine (NORVASC) 10 MG tablet Take 5 mg by mouth daily.   aspirin EC 81 MG tablet Take 1 tablet (81 mg total) by mouth daily. (Patient taking differently: Take 81 mg by mouth every evening.)   BD PEN NEEDLE NANO U/F 32G X 4 MM MISC    Coenzyme Q10 (CO Q-10) 200 MG CAPS Take 200 mg by mouth daily.    dapagliflozin propanediol (FARXIGA) 10 MG TABS tablet Take 10 mg by mouth daily.   ELIQUIS 5 MG TABS tablet Take 5 mg by mouth 2 (two) times daily.   Evolocumab (REPATHA SURECLICK) 140 MG/ML SOAJ INJECT 1 PEN INTO THE SKIN EVERY 14 (FOURTEEN) DAYS.   nebivolol (BYSTOLIC) 10 MG tablet every evening.   nitroGLYCERIN (NITROSTAT) 0.4 MG SL tablet PLACE 1 TABLET UNDER THE TONGUE EVERY 5 MINUTES AS NEEDED FOR CHEST PAIN.   omeprazole (PRILOSEC) 20 MG capsule Take 20 mg by mouth in the morning and at bedtime.    ONE TOUCH ULTRA TEST test strip    ONETOUCH DELICA LANCETS 33G MISC Apply 1 strip topically 3 (three) times daily.   Semaglutide, 2 MG/DOSE, (OZEMPIC, 2 MG/DOSE,) 8 MG/3ML SOPN Inject 2 mg into the skin once a week.   TRESIBA FLEXTOUCH 200 UNIT/ML SOPN Inject 60 Units as directed daily.   triamcinolone cream (KENALOG) 0.1 % Apply 1 application  topically 2 (two) times daily as needed (DRY SKIN).    [DISCONTINUED] OZEMPIC, 1 MG/DOSE, 4 MG/3ML SOPN Inject 1 mg into the skin once a week.   [DISCONTINUED] Semaglutide, 1 MG/DOSE, (OZEMPIC, 1 MG/DOSE,) 4 MG/3ML SOPN Inject 1 mg under the skin once a week. (Patient taking differently: Inject 1.5 mg into the skin once a week.)     Allergies  Allergen Reactions   Norvasc [Amlodipine Besylate] Other (See Comments)    Edema   Pioglitazone     Other reaction(s): Hives   Sitagliptin     Other reaction(s): Hives   Sulfa Antibiotics Hives    Social History   Socioeconomic History   Marital status:  Single    Spouse name: Not on file   Number of children: Not on file   Years of education: Not on file   Highest education level: Not on file  Occupational History   Occupation: Artist Housing  Tobacco Use   Smoking status: Never   Smokeless tobacco: Never  Vaping Use   Vaping status: Never Used  Substance and Sexual Activity   Alcohol use: Yes    Comment: occassionally   Drug use: No   Sexual activity: Not on file  Other Topics Concern   Not on file  Social History Narrative   Caffeine: "very little"   Social Determinants of Health   Financial Resource Strain: Not on file  Food Insecurity: Not on file  Transportation Needs: Not on file  Physical Activity: Not on file  Stress: Not on file  Social Connections: Unknown (07/25/2021)   Received from [facility], [facility]   Social Network    Social Network: Not on file  Intimate Partner Violence: Unknown (06/16/2021)   Received from [facility], [facility]   HITS    Physically Hurt: Not on file    Insult or Talk Down To: Not on file    Threaten Physical Harm: Not on file    Scream or Curse: Not on file     Review of Systems: General: negative for chills, fever, night sweats or weight changes.  Cardiovascular: negative for chest pain, dyspnea on exertion, edema, orthopnea, palpitations, paroxysmal nocturnal dyspnea or shortness of breath Dermatological: negative for rash Respiratory: negative for cough or wheezing Urologic: negative for hematuria Abdominal: negative for nausea, vomiting, diarrhea, bright red blood per rectum, melena, or hematemesis Neurologic: negative for visual changes, syncope, or dizziness All other systems reviewed and are otherwise negative except as noted above.    Blood pressure 120/74, pulse 70, height 5\' 7"  (1.702 m), weight 229 lb (103.9 kg).  General appearance: alert and no distress Neck: no adenopathy, no carotid bruit, no JVD, supple, symmetrical, trachea midline,  and thyroid not enlarged, symmetric, no tenderness/mass/nodules Lungs: clear to auscultation bilaterally Heart: regular rate and rhythm, S1, S2 normal, no murmur, click, rub or gallop Extremities: extremities normal, atraumatic, no cyanosis or edema Pulses: 2+ and symmetric Skin: Skin color, texture, turgor normal. No rashes or lesions Neurologic: Grossly normal  EKG EKG Interpretation Date/Time:  Monday December 31 2022 10:12:12 EDT Ventricular Rate:  70 PR Interval:    QRS Duration:  106 QT Interval:  402 QTC Calculation: 434 R Axis:   -64  Text Interpretation: Atrial fibrillation Left axis deviation Minimal voltage criteria for LVH, may be normal variant ( Cornell product ) Inferior infarct , age undetermined Anterior infarct , age undetermined When compared with ECG of 05-Dec-2019 04:50, PREVIOUS ECG IS PRESENT Confirmed by  Nanetta Batty [phone]) on 12/31/2022 10:40:43 AM    ASSESSMENT AND PLAN:   Coronary artery disease History of CAD status post Myoview stress test performed in 2012 revealing inferolateral scar with peri-infarct ischemia.  Because of this I performed cardiac catheterization on him 10/24/2010 revealing occluded circumflex in the mid AV groove, 50% mid LAD and proximal PDA stenosis with normal LV function.  I was ultimately able to cross his circumflex CTO and stenting with a 3 mm x 30 mm long resolute drug-eluting stent.  Follow-up Myoview showed improved perfusion to the lateral wall.  He is currently asymptomatic.  Essential hypertension History of essential hypertension with blood pressure measured today at 120/74.  He is on amlodipine, and Bystolic.  Dyslipidemia History of dyslipidemia on Repatha with lipid profile performed 04/02/2022 revealing total cholesterol 87, LDL 28 and HDL of 37.  Persistent atrial fibrillation (HCC) History of persistent A-fib rate control as oral anticoagulation.  Obstructive sleep apnea History of obstructive sleep apnea on  CPAP     Runell Gess MD [facility], [facility] 12/31/2022 10:47 AM

## 2022-12-31 NOTE — Patient Instructions (Signed)
Medication Instructions:  Your physician recommends that you continue on your current medications as directed. Please refer to the Current Medication list given to you today.  *If you need a refill on your cardiac medications before your next appointment, please call your pharmacy*  Follow-Up: At [facility], you and your health needs are our priority.  As part of our continuing mission to provide you with exceptional heart care, we have created designated Provider Care Teams.  These Care Teams include your primary Cardiologist (physician) and Advanced Practice Providers (APPs -  Physician Assistants and Nurse Practitioners) who all work together to provide you with the care you need, when you need it.  Your next appointment:   12 month(s)  Provider:   Nanetta Batty, MD

## 2022-12-31 NOTE — Assessment & Plan Note (Signed)
History of persistent A-fib rate control as oral anticoagulation.

## 2022-12-31 NOTE — Assessment & Plan Note (Signed)
History of obstructive sleep apnea on CPAP. 

## 2022-12-31 NOTE — Assessment & Plan Note (Signed)
History of essential hypertension with blood pressure measured today at 120/74.  He is on amlodipine, and Bystolic.

## 2022-12-31 NOTE — Assessment & Plan Note (Signed)
History of dyslipidemia on Repatha with lipid profile performed 04/02/2022 revealing total cholesterol 87, LDL 28 and HDL of 37.

## 2022-12-31 NOTE — Assessment & Plan Note (Signed)
History of CAD status post Myoview stress test performed in 2012 revealing inferolateral scar with peri-infarct ischemia.  Because of this I performed cardiac catheterization on him 10/24/2010 revealing occluded circumflex in the mid AV groove, 50% mid LAD and proximal PDA stenosis with normal LV function.  I was ultimately able to cross his circumflex CTO and stenting with a 3 mm x 30 mm long resolute drug-eluting stent.  Follow-up Myoview showed improved perfusion to the lateral wall.  He is currently asymptomatic.

## 2023-01-03 ENCOUNTER — Other Ambulatory Visit: Payer: Self-pay | Admitting: Cardiovascular Disease

## 2023-01-03 DIAGNOSIS — I2510 Atherosclerotic heart disease of native coronary artery without angina pectoris: Secondary | ICD-10-CM

## 2023-01-03 DIAGNOSIS — E785 Hyperlipidemia, unspecified: Secondary | ICD-10-CM

## 2023-01-15 ENCOUNTER — Other Ambulatory Visit (HOSPITAL_BASED_OUTPATIENT_CLINIC_OR_DEPARTMENT_OTHER): Payer: Self-pay

## 2023-01-17 ENCOUNTER — Other Ambulatory Visit (HOSPITAL_BASED_OUTPATIENT_CLINIC_OR_DEPARTMENT_OTHER): Payer: Self-pay

## 2023-01-18 ENCOUNTER — Other Ambulatory Visit: Payer: Self-pay | Admitting: Medical Genetics

## 2023-01-18 DIAGNOSIS — Z006 Encounter for examination for normal comparison and control in clinical research program: Secondary | ICD-10-CM

## 2023-02-11 ENCOUNTER — Other Ambulatory Visit: Payer: Self-pay | Admitting: Cardiovascular Disease

## 2023-02-11 ENCOUNTER — Encounter: Payer: Self-pay | Admitting: Cardiovascular Disease

## 2023-02-11 DIAGNOSIS — E785 Hyperlipidemia, unspecified: Secondary | ICD-10-CM

## 2023-02-11 DIAGNOSIS — I2510 Atherosclerotic heart disease of native coronary artery without angina pectoris: Secondary | ICD-10-CM

## 2023-02-12 ENCOUNTER — Telehealth: Payer: Self-pay | Admitting: Pharmacy Technician

## 2023-02-12 ENCOUNTER — Other Ambulatory Visit (HOSPITAL_COMMUNITY): Payer: Self-pay

## 2023-02-12 NOTE — Telephone Encounter (Signed)
Pharmacy Patient Advocate Encounter  Received notification from [facility] that Prior Authorization for repatha has been APPROVED from 02/12/23 to 02/11/24   PA #/Case ID/Reference #: [id number]

## 2023-02-12 NOTE — Telephone Encounter (Signed)
Pharmacy Patient Advocate Encounter   Received notification from Patient Advice Request messages that prior authorization for repatha is required/requested.   Insurance verification completed.   The patient is insured through [facility] .   Per test claim: PA required; PA submitted to above mentioned insurance via CoverMyMeds Key/confirmation #/EOC [id number] Status is pending

## 2023-02-22 MED ORDER — REPATHA SURECLICK 140 MG/ML ~~LOC~~ SOAJ: 140.0000 mg | SUBCUTANEOUS | 0 refills | Status: DC

## 2023-03-04 ENCOUNTER — Other Ambulatory Visit (HOSPITAL_BASED_OUTPATIENT_CLINIC_OR_DEPARTMENT_OTHER): Payer: Self-pay

## 2023-03-19 ENCOUNTER — Other Ambulatory Visit (HOSPITAL_BASED_OUTPATIENT_CLINIC_OR_DEPARTMENT_OTHER): Payer: Self-pay

## 2023-04-09 ENCOUNTER — Ambulatory Visit: Payer: 59 | Admitting: Physician Assistant

## 2023-04-09 ENCOUNTER — Telehealth: Payer: Self-pay

## 2023-04-09 ENCOUNTER — Encounter: Payer: Self-pay | Admitting: Physician Assistant

## 2023-04-09 VITALS — BP 118/70 | HR 90 | Ht 67.0 in | Wt 226.0 lb

## 2023-04-09 DIAGNOSIS — E1122 Type 2 diabetes mellitus with diabetic chronic kidney disease: Secondary | ICD-10-CM

## 2023-04-09 DIAGNOSIS — N1832 Chronic kidney disease, stage 3b: Secondary | ICD-10-CM | POA: Diagnosis not present

## 2023-04-09 DIAGNOSIS — I4819 Other persistent atrial fibrillation: Secondary | ICD-10-CM | POA: Diagnosis not present

## 2023-04-09 DIAGNOSIS — Z860101 Personal history of adenomatous and serrated colon polyps: Secondary | ICD-10-CM

## 2023-04-09 DIAGNOSIS — K5904 Chronic idiopathic constipation: Secondary | ICD-10-CM | POA: Diagnosis not present

## 2023-04-09 DIAGNOSIS — Z794 Long term (current) use of insulin: Secondary | ICD-10-CM

## 2023-04-09 DIAGNOSIS — K219 Gastro-esophageal reflux disease without esophagitis: Secondary | ICD-10-CM

## 2023-04-09 MED ORDER — SUFLAVE 178.7 G PO SOLR: 1.0000 | Freq: Once | ORAL | 0 refills | Status: AC

## 2023-04-09 NOTE — Progress Notes (Signed)
04/09/2023 Bradley Holmes [id number] 1961-01-21  Referring provider: Chilton Greathouse, MD Primary GI doctor: Dr. Lavon Paganini  ASSESSMENT AND PLAN:   History of adenomatous polyp of colon 2021 with TA polyps Recall 3 years ,will plan on recall We have discussed the risks of bleeding, infection, perforation, medication reactions, and remote risk of death associated with colonoscopy. All questions were answered and the patient acknowledges these risk and wishes to proceed.  Persistent atrial fibrillation (HCC) Hold Eliquis for 2 days before procedure will instruct when and how to resume after procedure.  Patient understands that there is a low but real risk of cardiovascular event such as heart attack, stroke, or embolism /  thrombosis, or ischemia while off Eliquis  The patient consents to proceed.  Will communicate by phone or EMR with patient's prescribing provider to confirm that holding Eliquis is reasonable in this case.   Type 2 diabetes mellitus with stage 3b chronic kidney disease, with long-term current use of insulin (HCC) Discussed GLP1 with the patient, mechanism of action. Patient should be instructed to hold this medications if dose falls within 7 days of endoscopic procedure, due to increased risk of retained gastric contents.  Chronic idiopathic constipation Likely secondary to GLP1 - Increase fiber/ water intake, decrease caffeine, increase activity level. -Will add on Miralax daily  Gastroesophageal reflux disease without esophagitis Lifestyle changes discussed, avoid NSAIDS, ETOH Well controlled on PPI Continue weight loss   Patient Care Team: Chilton Greathouse, MD as PCP - General (Internal Medicine) Runell Gess, MD as PCP - Cardiology (Cardiology) [facility], Roe Rutherford, PA as Physician Assistant (Cardiology)  HISTORY OF PRESENT ILLNESS: 63 y.o. male referred by Chilton Greathouse, MD presents for consultation of colonoscopy while on blood  thinner.  He has past medical history significant for persistent A-fib, hypertension, CAD, OSA, type 2 diabetes with diabetic retinopathy, with CKD stage IIIb, hyperlipidemia obesity, GERD, gastroparesis, personal history of adenomatous colon polyp and others listed below.  Most recent colonoscopy 2021 with multiple tubular adenomatous polyps recall 3 years patient also had EGD for epigastric discomfort that showed mild chronic gastritis, gastric emptying study negative.  Maintained on omeprazole, he is on it twice a day, no issues.  Patient is on Eliquis for A-fib, prescribed by Dr. Allyson Sabal. Denies SOB, CP.  He has been on ozempic since 2021/2022, has lost about 70 lbs. He has had some intermittent GI issues with the medication. He has had intermittent constipation that is resolved with miralax.  Patient denies dysphagia, nausea, vomiting, melena, hematochezia.    He  reports that he has never smoked. He has never used smokeless tobacco. He reports current alcohol use. He reports that he does not use drugs.  RELEVANT GI HISTORY, LABS, IMAGING: 02/10/2014 colonoscopy Dr. Juanda Chance with 3 polyps and diverticulosis.  Pathology showed tubular adenoma and repeat was recommended in 5 years.  06/26/2019 CT abdomen pelvis without IV contrast for abdominal pain was normal.  08/17/2019 colonoscopy with 3 5-8 mm polyps at the [facility] flexure and the ascending colon, one 11 mm polyp in the transverse colon, 3 1-2 mm polyps in the rectum and the sigmoid colon as well as the transverse colon, diverticulosis in the sigmoid and descending colon, nonbleeding external and internal hemorrhoids. Repeat was recommended in 3 years. EGD with irregular Z-line, no gross lesions in the esophagus, excessive gastric fluid, a single gastric polyp, gastritis, erythematous to adenopathy and normal second portion of the duodenum. Recommend he have a gastric emptying scan to  exclude gastroparesis. Pathology showed tubular adenomas. Also mild  chronic gastritis.  09/28/2019 normal gastric emptying study  CBC    Component Value Date/Time   WBC 7.8 12/04/2019 1903   RBC 5.16 12/04/2019 1903   HGB 15.6 12/04/2019 1903   HGB 15.4 11/27/2017 0830   HCT 49.4 12/04/2019 1903   HCT 45.1 11/27/2017 0830   PLT 187 12/04/2019 1903   PLT 181 11/27/2017 0830   MCV 95.7 12/04/2019 1903   MCV 89 11/27/2017 0830   MCH 30.2 12/04/2019 1903   MCHC 31.6 12/04/2019 1903   RDW 12.3 12/04/2019 1903   RDW 13.1 11/27/2017 0830   LYMPHSABS 2.3 12/04/2019 1903   LYMPHSABS 1.5 11/27/2017 0830   MONOABS 1.1 (H) 12/04/2019 1903   EOSABS 0.0 12/04/2019 1903   EOSABS 0.2 11/27/2017 0830   BASOSABS 0.0 12/04/2019 1903   BASOSABS 0.0 11/27/2017 0830   No results for input(s): "HGB" in the last 8760 hours.  CMP     Component Value Date/Time   NA 134 (L) 12/05/2019 1250   NA 141 11/27/2017 0830   K 5.3 (H) 12/05/2019 1250   CL 102 12/05/2019 1250   CO2 21 (L) 12/05/2019 1250   GLUCOSE 133 (H) 12/05/2019 1250   BUN 57 (H) 12/05/2019 1250   BUN 25 (H) 11/27/2017 0830   CREATININE 1.89 (H) 12/05/2019 1250   CALCIUM 9.5 12/05/2019 1250   PROT 7.4 12/04/2019 1903   ALBUMIN 4.3 12/04/2019 1903   AST 45 (H) 12/04/2019 1903   ALT 56 (H) 12/04/2019 1903   ALKPHOS 63 12/04/2019 1903   BILITOT 1.0 12/04/2019 1903   GFRNONAA 38 (L) 12/05/2019 1250   GFRAA 44 (L) 12/05/2019 1250      Latest Ref Rng & Units 12/04/2019    7:03 PM 11/01/2010    1:24 AM  Hepatic Function  Total Protein 6.5 - 8.1 g/dL 7.4  7.4   Albumin 3.5 - 5.0 g/dL 4.3  4.2   AST 15 - 41 U/L 45  26   ALT 0 - 44 U/L 56  37   Alk Phosphatase 38 - 126 U/L 63  107   Total Bilirubin 0.3 - 1.2 mg/dL 1.0  0.3       Current Medications:   Current Outpatient Medications (Endocrine & Metabolic):    dapagliflozin propanediol (FARXIGA) 10 MG TABS tablet, Take 10 mg by mouth daily.   Semaglutide, 2 MG/DOSE, (OZEMPIC, 2 MG/DOSE,) 8 MG/3ML SOPN, Inject 2 mg into the skin once a week.    TRESIBA FLEXTOUCH 200 UNIT/ML SOPN, Inject 60 Units as directed daily.  Current Outpatient Medications (Cardiovascular):    amLODipine (NORVASC) 10 MG tablet, Take 5 mg by mouth daily.   Evolocumab (REPATHA SURECLICK) 140 MG/ML SOAJ, Inject 140 mg into the skin every 14 (fourteen) days.   nebivolol (BYSTOLIC) 10 MG tablet, every evening.   nitroGLYCERIN (NITROSTAT) 0.4 MG SL tablet, PLACE 1 TABLET UNDER THE TONGUE EVERY 5 MINUTES AS NEEDED FOR CHEST PAIN.   Current Outpatient Medications (Analgesics):    aspirin EC 81 MG tablet, Take 1 tablet (81 mg total) by mouth daily. (Patient taking differently: Take 81 mg by mouth every evening.)  Current Outpatient Medications (Hematological):    ELIQUIS 5 MG TABS tablet, Take 5 mg by mouth 2 (two) times daily.  Current Outpatient Medications (Other):    BD PEN NEEDLE NANO U/F 32G X 4 MM MISC,    Coenzyme Q10 (CO Q-10) 200 MG CAPS, Take 200 mg by  mouth daily.    omeprazole (PRILOSEC) 20 MG capsule, Take 20 mg by mouth in the morning and at bedtime.    ONE TOUCH ULTRA TEST test strip,    ONETOUCH DELICA LANCETS 33G MISC, Apply 1 strip topically 3 (three) times daily.   triamcinolone cream (KENALOG) 0.1 %, Apply 1 application topically 2 (two) times daily as needed (DRY SKIN).   Medical History:  Past Medical History:  Diagnosis Date   Adenomatous colon polyp    Coronary artery disease    LEXISCAN, 01/05/2011 - defect seen in Basal Inferior, Mid Inferior, and Apical Lateral region(s)-consistent with infarct/scar, global LV systolic function mildly reduced, abnormal study   Gastroparesis    GERD (gastroesophageal reflux disease)    Hyperlipidemia    Hypertension    2D ECHO, 10/10/2010 - EF >55%, normal   Insulin dependent diabetes mellitus    Pseudoaneurysm (HCC)    LEA DOPPLER, 11/01/2010 - right groin hematoma measuring 2.47x0.62x1.61 centimeters in greatest diameter   Allergies:  Allergies  Allergen Reactions   Norvasc [Amlodipine  Besylate] Other (See Comments)    Edema   Pioglitazone     Other reaction(s): Hives   Sitagliptin     Other reaction(s): Hives   Sulfa Antibiotics Hives     Surgical History:  He  has a past surgical history that includes Cardiac catheterization (10/24/2010) and Cardioversion (N/A, 12/02/2017). Family History:  His family history includes Diabetes in his mother and sister; Emphysema in his paternal grandfather; Heart disease (age of onset: 46) in his mother; Prostate cancer in his maternal grandfather; Thyroid cancer in his brother.  REVIEW OF SYSTEMS  : All other systems reviewed and negative except where noted in the History of Present Illness.  PHYSICAL EXAM: BP 118/70   Pulse 90   Ht 5\' 7"  (1.702 m)   Wt 226 lb (102.5 kg)   BMI 35.40 kg/m  General Appearance: Well nourished, in no apparent distress. Head:   Normocephalic and atraumatic. Eyes:  sclerae anicteric,conjunctive pink  Respiratory: Respiratory effort normal, BS equal bilaterally without rales, rhonchi, wheezing. Cardio: regular rate, PVCs with no MRGs. Peripheral pulses intact.  Abdomen: Soft,  Obese ,active bowel sounds. No tenderness . No masses. Rectal: Not evaluated Musculoskeletal: Full ROM, Normal gait. Without edema. Skin:  Dry and intact without significant lesions or rashes Neuro: Alert and  oriented x4;  No focal deficits. Psych:  Cooperative. Normal mood and affect.    Doree Albee, PA-C 9:28 AM

## 2023-04-09 NOTE — Telephone Encounter (Signed)
Left message to call back to set up labs.

## 2023-04-09 NOTE — Telephone Encounter (Signed)
[facility] Pre-operative Risk Assessment     Request for surgical clearance:     Endoscopy Procedure  What type of surgery is being performed?     COLONOSCOPY   When is this surgery scheduled?     05/31/2023  What type of clearance is required ?   Pharmacy  Are there any medications that need to be held prior to surgery and how long? ELIQUIS- 2 DAY   Practice name and name of physician performing surgery?      [facility]  What is your office phone and fax number?      Phone- [phone]  Fax- [phone]  Anesthesia type (None, local, MAC, general) ?       MAC   Please route your response to Markcus Lazenby

## 2023-04-09 NOTE — Patient Instructions (Signed)
  You will receive your bowel preparation through [facility], which ensures the lowest copay and home delivery, with outreach via text or call from an [phone] number. Please respond promptly to avoid rescheduling. If you are interested in alternative options or have any questions please contact them at [phone]  Your Provider Has Sent Your Bowel Prep Regimen To [facility] What to expect. [facility] will contact you to verify your information and collect your copay, if applicable. Enjoy the comfort of your home while we deliver your prescription to you, free of any shipping charges. Fast, FREE delivery or shipping. [facility] accepts all major insurance benefits and applies discounts & coupons  Have additional questions? [facility]'s patient care team is always here to help.  Chat: [email or web address] Call: [phone] Email: [email or web address] NCPDP: [id number] How will we contact you? Welcome Phone call  a Welcome text and a Checkout link in a text Texts you receive from [phone] Are Not Spam.   *To set up delivery, you must complete the checkout process via link or speak to one of our patient care representatives. If we are unable to reach you, your prescription may be delayed.  _______________________________________________________  If your blood pressure at your visit was 140/90 or greater, please contact your primary care physician to follow up on this.  _______________________________________________________  If you are age 63 or older, your body mass index should be between 23-30. Your Body mass index is 35.4 kg/m. If this is out of the aforementioned range listed, please consider follow up with your Primary Care Provider.  If you are age 10 or younger, your body mass index should be between 19-25. Your Body mass index is 35.4 kg/m. If this is out of the aformentioned range listed, please consider follow up with your Primary Care Provider.    ________________________________________________________  The [facility] providers would like to encourage you to use [facility] to communicate with providers for non-urgent requests or questions.  Due to long hold times on the telephone, sending your provider a message by [facility] may be a faster and more efficient way to get a response.  Please allow 48 business hours for a response.  Please remember that this is for non-urgent requests.  _______________________________________________________ It was a pleasure to see you today!  Thank you for trusting me with your gastrointestinal care!

## 2023-04-09 NOTE — Telephone Encounter (Signed)
Please arrange bmet, cbc Tereso Newcomer, PA-C    04/09/2023 4:36 PM

## 2023-04-09 NOTE — Telephone Encounter (Signed)
Patient with diagnosis of atrial fibrillation on Eliquis for anticoagulation.    Procedure: colonoscopy Date of procedure: 05/31/23   CHA2DS2-VASc Score = 3   This indicates a 3.2% annual risk of stroke. The patient's score is based upon: CHF History: 0 HTN History: 1 Diabetes History: 1 Stroke History: 0 Vascular Disease History: 1 Age Score: 0 Gender Score: 0    No current labs in system, please draw BMET, CBC for final clearance  **This guidance is not considered finalized until pre-operative APP has relayed final recommendations.**

## 2023-04-11 LAB — LAB REPORT - SCANNED
A1c: 7.9
EGFR: 67.8
TSH: 2.05 (ref 0.41–5.90)

## 2023-04-12 NOTE — Telephone Encounter (Signed)
I left message for med rec dept at PCP pt states he had labs done yesterday.  We are inquiring if a BMP and CBC may have been done. If so, may we have the results faxed to our office [phone] to come in directly to preop and not the electronic fax as pre op is needing the labs.

## 2023-04-12 NOTE — Telephone Encounter (Signed)
Preoperative team, please reach back out to this patient about lab work.  He will need CBC and BMP in order for pharmacy to make recommendations.  Thank you for your help.  Thomasene Ripple. Oather Muilenburg NP-C     04/12/2023, 12:30 PM [facility] [address] Office [phone] Fax [phone]

## 2023-04-15 NOTE — Telephone Encounter (Signed)
Patient with diagnosis of atrial fibrillation on Eliquis for anticoagulation.     Procedure: colonoscopy Date of procedure: 05/31/23     CHA2DS2-VASc Score = 3   This indicates a 3.2% annual risk of stroke. The patient's score is based upon: CHF History: 0 HTN History: 1 Diabetes History: 1 Stroke History: 0 Vascular Disease History: 1 Age Score: 0 Gender Score: 0      CrCl: 101 ml/min Platlets: 176K  Per protocol, patient can hold Eliquis for 2 days prior to procedure

## 2023-04-15 NOTE — Telephone Encounter (Signed)
Checked on [facility] and only Lipid panel was drawn at PCP's office.  Therefore, call placed to pt to arrange BMET & CBC.  Left a message for pt to call back and ask for the preop team.

## 2023-04-15 NOTE — Telephone Encounter (Signed)
     Primary Cardiologist: Nanetta Batty, MD  Clinical pharmacist have reviewed surgical request and provided the following recommendations for ,Bradley Holmes    Patient with diagnosis of atrial fibrillation on Eliquis for anticoagulation.     Procedure: colonoscopy Date of procedure: 05/31/23     CHA2DS2-VASc Score = 3   This indicates a 3.2% annual risk of stroke. The patient's score is based upon: CHF History: 0 HTN History: 1 Diabetes History: 1 Stroke History: 0 Vascular Disease History: 1 Age Score: 0 Gender Score: 0      CrCl: 101 ml/min Platlets: 176K   Per protocol, patient can hold Eliquis for 2 days prior to procedure  I will route this recommendation to the requesting party via Epic fax function and remove from pre-op pool.  Please call with questions.  Thomasene Ripple. Karmin Kasprzak NP-C     04/15/2023, 1:40 PM [facility] [address] Office [phone] Fax [phone]

## 2023-04-16 ENCOUNTER — Telehealth: Payer: Self-pay

## 2023-04-16 NOTE — Telephone Encounter (Signed)
Called patient to let him know of his eliquis hold, can not leave vm

## 2023-05-08 ENCOUNTER — Other Ambulatory Visit (HOSPITAL_COMMUNITY): Payer: Self-pay

## 2023-05-13 ENCOUNTER — Other Ambulatory Visit (HOSPITAL_BASED_OUTPATIENT_CLINIC_OR_DEPARTMENT_OTHER): Payer: Self-pay

## 2023-05-13 MED ORDER — OZEMPIC (2 MG/DOSE) 8 MG/3ML ~~LOC~~ SOPN
2.0000 mg | PEN_INJECTOR | SUBCUTANEOUS | 5 refills | Status: DC
  Filled 2023-05-13: qty 3, 28d supply, fill #0
  Filled 2023-06-06: qty 3, 28d supply, fill #1
  Filled 2023-07-04: qty 3, 28d supply, fill #2
  Filled 2023-08-16: qty 3, 28d supply, fill #3
  Filled 2023-09-14: qty 3, 28d supply, fill #4
  Filled 2023-10-07 – 2023-10-12 (×2): qty 3, 28d supply, fill #5

## 2023-05-24 ENCOUNTER — Encounter: Payer: Self-pay | Admitting: Gastroenterology

## 2023-05-24 ENCOUNTER — Telehealth: Payer: Self-pay | Admitting: Physician Assistant

## 2023-05-24 NOTE — Telephone Encounter (Signed)
 Patient is calling to know when he should stop taking his eliquis. Patient is requesting a call back. Please advise.

## 2023-05-24 NOTE — Telephone Encounter (Signed)
 Refer to phone note 04/16/23. CMA spoke with patient today.

## 2023-05-24 NOTE — Telephone Encounter (Signed)
 Patient called and stated that he was wanting to know if he could speak to the nurse regarding when he should stop taking certain medication, and was wondering if we could send him a copy of his prep instruction as well. Patient is requesting a call back. Please advise.

## 2023-05-31 ENCOUNTER — Encounter: Payer: Self-pay | Admitting: Gastroenterology

## 2023-05-31 ENCOUNTER — Ambulatory Visit: Payer: 59 | Admitting: Gastroenterology

## 2023-05-31 VITALS — BP 131/84 | HR 69 | Temp 99.3°F | Resp 16 | Ht 67.0 in | Wt 226.0 lb

## 2023-05-31 DIAGNOSIS — Z860101 Personal history of adenomatous and serrated colon polyps: Secondary | ICD-10-CM

## 2023-05-31 DIAGNOSIS — K644 Residual hemorrhoidal skin tags: Secondary | ICD-10-CM | POA: Diagnosis not present

## 2023-05-31 DIAGNOSIS — D123 Benign neoplasm of transverse colon: Secondary | ICD-10-CM

## 2023-05-31 DIAGNOSIS — Z1211 Encounter for screening for malignant neoplasm of colon: Secondary | ICD-10-CM | POA: Diagnosis present

## 2023-05-31 DIAGNOSIS — K648 Other hemorrhoids: Secondary | ICD-10-CM

## 2023-05-31 MED ORDER — SODIUM CHLORIDE 0.9 % IV SOLN
500.0000 mL | Freq: Once | INTRAVENOUS | Status: DC
Start: 2023-05-31 — End: 2023-05-31

## 2023-05-31 NOTE — Progress Notes (Signed)
 Pt A/O x 3, gd SR's, pleased with anesthesia, report to RN

## 2023-05-31 NOTE — Progress Notes (Signed)
  Gastroenterology History and Physical   Primary Care Physician:  Chilton Greathouse, MD   Reason for Procedure:  History of adenomatous colon polyps  Plan:    Surveillance colonoscopy with possible interventions as needed     HPI: Bradley Holmes is a very pleasant 63 y.o. male here for surveillance colonoscopy. Denies any nausea, vomiting, abdominal pain, melena or bright red blood per rectum  The risks and benefits as well as alternatives of endoscopic procedure(s) have been discussed and reviewed. All questions answered. The patient agrees to proceed.    Past Medical History:  Diagnosis Date   A-fib (HCC)    Adenomatous colon polyp    Coronary artery disease    LEXISCAN, 01/05/2011 - defect seen in Basal Inferior, Mid Inferior, and Apical Lateral region(s)-consistent with infarct/scar, global LV systolic function mildly reduced, abnormal study   Gastroparesis    GERD (gastroesophageal reflux disease)    Hyperlipidemia    Hypertension    2D ECHO, 10/10/2010 - EF >55%, normal   Insulin dependent diabetes mellitus    Pseudoaneurysm (HCC)    LEA DOPPLER, 11/01/2010 - right groin hematoma measuring 2.47x0.62x1.61 centimeters in greatest diameter   Sleep apnea     Past Surgical History:  Procedure Laterality Date   CARDIAC CATHETERIZATION  10/24/2010   CTI - 3030 Resolute drug-eluting stent, 3.35mm resulting reduction og a long CT to 0% residual with excellent flow.   CARDIOVERSION N/A 12/02/2017   Procedure: CARDIOVERSION;  Surgeon: Laurey Morale, MD;  Location: [facility] ENDOSCOPY;  Service: Cardiovascular;  Laterality: N/A;   TONSILLECTOMY     Childhood    Prior to Admission medications   Medication Sig Start Date End Date Taking? Authorizing Provider  amLODipine (NORVASC) 10 MG tablet Take 5 mg by mouth daily. 12/02/19  Yes [provider]  aspirin EC 81 MG tablet Take 1 tablet (81 mg total) by mouth daily. Patient taking differently: Take 81 mg by mouth  every evening. 10/15/17  Yes Runell Gess, MD  BD PEN NEEDLE NANO U/F 32G X 4 MM MISC  06/24/12  Yes [provider]  Coenzyme Q10 (CO Q-10) 200 MG CAPS Take 200 mg by mouth daily.    Yes [provider]  Continuous Glucose Sensor (FREESTYLE LIBRE 2 SENSOR) MISC CHANGE EVERY 14 DAYS TO MONITOR GLUCOSE CONTINUOUSLY. E11.65 28 DAYS 05/01/23  Yes [provider]  dapagliflozin propanediol (FARXIGA) 10 MG TABS tablet Take 10 mg by mouth daily.   Yes [provider]  Evolocumab (REPATHA SURECLICK) 140 MG/ML SOAJ Inject 140 mg into the skin every 14 (fourteen) days. 02/22/23  Yes Runell Gess, MD  nebivolol (BYSTOLIC) 10 MG tablet every evening.   Yes [provider]  omeprazole (PRILOSEC) 20 MG capsule Take 20 mg by mouth in the morning and at bedtime.    Yes [provider]  ONE TOUCH ULTRA TEST test strip  11/14/12  Yes [provider]  Dola Argyle LANCETS 33G MISC Apply 1 strip topically 3 (three) times daily. 11/16/14  Yes [provider]  TRESIBA FLEXTOUCH 200 UNIT/ML SOPN Inject 60 Units as directed daily. 02/01/17  Yes [provider]  ELIQUIS 5 MG TABS tablet Take 5 mg by mouth 2 (two) times daily. 03/06/21   [provider]  nitroGLYCERIN (NITROSTAT) 0.4 MG SL tablet PLACE 1 TABLET UNDER THE TONGUE EVERY 5 MINUTES AS NEEDED FOR CHEST PAIN. 07/12/22   Runell Gess, MD  polyethylene glycol powder (MIRALAX) 17 GM/SCOOP  powder 1 scoop mixed with 8 ounces of fluid Orally Once a day    [provider]  Semaglutide, 2 MG/DOSE, (OZEMPIC, 2 MG/DOSE,) 8 MG/3ML SOPN Inject 2 mg into the skin once a week. 05/13/23     triamcinolone cream (KENALOG) 0.1 % Apply 1 application topically 2 (two) times daily as needed (DRY SKIN).     [provider]    Current Outpatient Medications  Medication Sig Dispense Refill   amLODipine (NORVASC) 10 MG tablet Take 5 mg by mouth daily.     aspirin EC 81 MG  tablet Take 1 tablet (81 mg total) by mouth daily. (Patient taking differently: Take 81 mg by mouth every evening.) 90 tablet 3   BD PEN NEEDLE NANO U/F 32G X 4 MM MISC      Coenzyme Q10 (CO Q-10) 200 MG CAPS Take 200 mg by mouth daily.      Continuous Glucose Sensor (FREESTYLE LIBRE 2 SENSOR) MISC CHANGE EVERY 14 DAYS TO MONITOR GLUCOSE CONTINUOUSLY. E11.65 28 DAYS     dapagliflozin propanediol (FARXIGA) 10 MG TABS tablet Take 10 mg by mouth daily.     Evolocumab (REPATHA SURECLICK) 140 MG/ML SOAJ Inject 140 mg into the skin every 14 (fourteen) days. 6 mL 0   nebivolol (BYSTOLIC) 10 MG tablet every evening.     omeprazole (PRILOSEC) 20 MG capsule Take 20 mg by mouth in the morning and at bedtime.      ONE TOUCH ULTRA TEST test strip      ONETOUCH DELICA LANCETS 33G MISC Apply 1 strip topically 3 (three) times daily.  8   TRESIBA FLEXTOUCH 200 UNIT/ML SOPN Inject 60 Units as directed daily.  6   ELIQUIS 5 MG TABS tablet Take 5 mg by mouth 2 (two) times daily.     nitroGLYCERIN (NITROSTAT) 0.4 MG SL tablet PLACE 1 TABLET UNDER THE TONGUE EVERY 5 MINUTES AS NEEDED FOR CHEST PAIN. 25 tablet 3   polyethylene glycol powder (MIRALAX) 17 GM/SCOOP powder 1 scoop mixed with 8 ounces of fluid Orally Once a day     Semaglutide, 2 MG/DOSE, (OZEMPIC, 2 MG/DOSE,) 8 MG/3ML SOPN Inject 2 mg into the skin once a week. 3 mL 5   triamcinolone cream (KENALOG) 0.1 % Apply 1 application topically 2 (two) times daily as needed (DRY SKIN).      Current Facility-Administered Medications  Medication Dose Route Frequency Provider Last Rate Last Admin   0.9 %  sodium chloride infusion  500 mL Intravenous Once Napoleon Form, MD        Allergies as of 05/31/2023 - Review Complete 05/31/2023  Allergen Reaction Noted   Pioglitazone Other (See Comments) 04/18/2010   Sitagliptin Other (See Comments) 04/18/2010   Sulfa antibiotics Hives 08/08/2012   Norvasc [amlodipine besylate] Other (See Comments) 08/08/2012     Family History  Problem Relation Age of Onset   Heart disease Mother 35       Stent placement   Diabetes Mother    Diabetes Sister    Thyroid cancer Brother        2010   Prostate cancer Maternal Grandfather    Emphysema Paternal Grandfather    Colon cancer Neg Hx    Esophageal cancer Neg Hx    Stomach cancer Neg Hx    Rectal cancer Neg Hx     Social History   Socioeconomic History   Marital status: Single    Spouse name: Not on file   Number of children:  Not on file   Years of education: Not on file   Highest education level: Not on file  Occupational History   Occupation: Artist Housing  Tobacco Use   Smoking status: Never   Smokeless tobacco: Never  Vaping Use   Vaping status: Never Used  Substance and Sexual Activity   Alcohol use: Yes    Comment: occassionally   Drug use: No   Sexual activity: Not on file  Other Topics Concern   Not on file  Social History Narrative   Caffeine: "very little"   Social Drivers of Corporate investment banker Strain: Not on file  Food Insecurity: Not on file  Transportation Needs: Not on file  Physical Activity: Not on file  Stress: Not on file  Social Connections: Unknown (07/25/2021)   Received from [facility]   Social Network    Social Network: Not on file  Intimate Partner Violence: Unknown (06/16/2021)   Received from [facility]   HITS    Physically Hurt: Not on file    Insult or Talk Down To: Not on file    Threaten Physical Harm: Not on file    Scream or Curse: Not on file    Review of Systems:  All other review of systems negative except as mentioned in the HPI.  Physical Exam: Vital signs in last 24 hours: BP (!) 161/95   Pulse 75   Temp 99.3 F (37.4 C) (Temporal)   Ht 5\' 7"  (1.702 m)   Wt 226 lb (102.5 kg)   SpO2 98%   BMI 35.40 kg/m  General:   Alert, NAD Lungs:  Clear .   Heart:  Regular rate and rhythm Abdomen:  Soft, nontender and  nondistended. Neuro/Psych:  Alert and cooperative. Normal mood and affect. A and O x 3  Reviewed labs, radiology imaging, old records and pertinent past GI work up  Patient is appropriate for planned procedure(s) and anesthesia in an ambulatory setting   K. Scherry Ran , MD [phone]

## 2023-05-31 NOTE — Progress Notes (Signed)
 Called to room to assist during endoscopic procedure.  Patient ID and intended procedure confirmed with present staff. Received instructions for my participation in the procedure from the performing physician.

## 2023-05-31 NOTE — Patient Instructions (Signed)

## 2023-05-31 NOTE — Op Note (Addendum)
 [facility] Patient Name: Bradley Holmes Procedure Date: 05/31/2023 1:56 PM MRN: [id number] Endoscopist: Napoleon Form , MD, [id number] Age: 63 Referring MD:  Date of Birth: January 30, 1961 Gender: Male Account #: [id number] Procedure:                Colonoscopy Indications:              High risk colon cancer surveillance: Personal                            history of adenoma (10 mm or greater in size), High                            risk colon cancer surveillance: Personal history of                            multiple (3 or more) adenomas Medicines:                Monitored Anesthesia Care Procedure:                Pre-Anesthesia Assessment:                           - Prior to the procedure, a History and Physical                            was performed, and patient medications and                            allergies were reviewed. The patient's tolerance of                            previous anesthesia was also reviewed. The risks                            and benefits of the procedure and the sedation                            options and risks were discussed with the patient.                            All questions were answered, and informed consent                            was obtained. Prior Anticoagulants: The patient                            last took Eliquis (apixaban) 2 days prior to the                            procedure. ASA Grade Assessment: III - A patient                            with severe systemic disease. After reviewing the  risks and benefits, the patient was deemed in                            satisfactory condition to undergo the procedure.                           After obtaining informed consent, the colonoscope                            was passed under direct vision. Throughout the                            procedure, the patient's blood pressure, pulse, and                            oxygen  saturations were monitored continuously. The                            Olympus Scope SN [id number] was introduced through the                            anus and advanced to the the cecum, identified by                            appendiceal orifice and ileocecal valve. The                            colonoscopy was performed without difficulty. The                            patient tolerated the procedure well. The quality                            of the bowel preparation was good. The ileocecal                            valve, appendiceal orifice, and rectum were                            photographed. Scope In: 2:01:29 PM Scope Out: 2:17:25 PM Scope Withdrawal Time: 0 hours 12 minutes 7 seconds  Total Procedure Duration: 0 hours 15 minutes 56 seconds  Findings:                 The perianal and digital rectal examinations were                            normal.                           Three sessile polyps were found in the transverse                            colon. The polyps were 3 to 4 mm in size. These  polyps were removed with a cold snare. Resection                            and retrieval were complete.                           Non-bleeding external and internal hemorrhoids were                            found during retroflexion. The hemorrhoids were                            medium-sized. Complications:            No immediate complications. Estimated Blood Loss:     Estimated blood loss was minimal. Impression:               - Three 3 to 4 mm polyps in the transverse colon,                            removed with a cold snare. Resected and retrieved.                           - Non-bleeding external and internal hemorrhoids. Recommendation:           - Patient has a contact number available for                            emergencies. The signs and symptoms of potential                            delayed complications were discussed with the                             patient. Return to normal activities tomorrow.                            Written discharge instructions were provided to the                            patient.                           - Resume previous diet.                           - Continue present medications.                           - Await pathology results.                           - Repeat colonoscopy in 3 - 5 years for                            surveillance based on pathology results.                           -  Resume Eliquis (apixaban) at prior dose tomorrow.                            Refer to managing physician for further adjustment                            of therapy. Napoleon Form, MD 05/31/2023 2:24:14 PM This report has been signed electronically.

## 2023-06-03 ENCOUNTER — Telehealth: Payer: Self-pay

## 2023-06-03 NOTE — Telephone Encounter (Signed)
  Follow up Call-     05/31/2023    1:25 PM  Call back number  Post procedure Call Back phone  # [phone]  Permission to leave phone message Yes     Patient questions:  Do you have a fever, pain , or abdominal swelling? No. Pain Score  0 *  Have you tolerated food without any problems? Yes.    Have you been able to return to your normal activities? Yes.    Do you have any questions about your discharge instructions: Diet   No. Medications  No. Follow up visit  No.  Do you have questions or concerns about your Care? No.  Actions: * If pain score is 4 or above: No action needed, pain <4.

## 2023-06-05 LAB — SURGICAL PATHOLOGY

## 2023-06-07 ENCOUNTER — Other Ambulatory Visit (HOSPITAL_BASED_OUTPATIENT_CLINIC_OR_DEPARTMENT_OTHER): Payer: Self-pay

## 2023-07-23 ENCOUNTER — Ambulatory Visit: Payer: Self-pay | Admitting: Gastroenterology

## 2023-07-24 ENCOUNTER — Other Ambulatory Visit: Payer: Self-pay | Admitting: Cardiovascular Disease

## 2023-07-24 DIAGNOSIS — I2510 Atherosclerotic heart disease of native coronary artery without angina pectoris: Secondary | ICD-10-CM

## 2023-07-24 DIAGNOSIS — E785 Hyperlipidemia, unspecified: Secondary | ICD-10-CM

## 2023-09-14 ENCOUNTER — Other Ambulatory Visit (HOSPITAL_BASED_OUTPATIENT_CLINIC_OR_DEPARTMENT_OTHER): Payer: Self-pay

## 2023-10-07 ENCOUNTER — Other Ambulatory Visit (HOSPITAL_BASED_OUTPATIENT_CLINIC_OR_DEPARTMENT_OTHER): Payer: Self-pay

## 2023-10-12 ENCOUNTER — Other Ambulatory Visit (HOSPITAL_BASED_OUTPATIENT_CLINIC_OR_DEPARTMENT_OTHER): Payer: Self-pay

## 2023-10-18 ENCOUNTER — Encounter (HOSPITAL_BASED_OUTPATIENT_CLINIC_OR_DEPARTMENT_OTHER): Payer: Self-pay

## 2023-10-18 ENCOUNTER — Emergency Department (HOSPITAL_BASED_OUTPATIENT_CLINIC_OR_DEPARTMENT_OTHER)
Admission: EM | Admit: 2023-10-18 | Discharge: 2023-10-18 | Disposition: A | Discharge status: Home / Self Care | Attending: Emergency Medicine | Admitting: Emergency Medicine

## 2023-10-18 ENCOUNTER — Emergency Department (HOSPITAL_BASED_OUTPATIENT_CLINIC_OR_DEPARTMENT_OTHER)

## 2023-10-18 ENCOUNTER — Emergency Department (HOSPITAL_BASED_OUTPATIENT_CLINIC_OR_DEPARTMENT_OTHER): Admitting: Radiology

## 2023-10-18 ENCOUNTER — Other Ambulatory Visit: Payer: Self-pay

## 2023-10-18 DIAGNOSIS — S60212A Contusion of left wrist, initial encounter: Secondary | ICD-10-CM | POA: Insufficient documentation

## 2023-10-18 DIAGNOSIS — S8011XA Contusion of right lower leg, initial encounter: Secondary | ICD-10-CM

## 2023-10-18 DIAGNOSIS — M79651 Pain in right thigh: Secondary | ICD-10-CM | POA: Diagnosis not present

## 2023-10-18 DIAGNOSIS — Z7982 Long term (current) use of aspirin: Secondary | ICD-10-CM | POA: Diagnosis not present

## 2023-10-18 DIAGNOSIS — Z23 Encounter for immunization: Secondary | ICD-10-CM | POA: Diagnosis not present

## 2023-10-18 DIAGNOSIS — Y92410 Unspecified street and highway as the place of occurrence of the external cause: Secondary | ICD-10-CM | POA: Insufficient documentation

## 2023-10-18 DIAGNOSIS — S6992XA Unspecified injury of left wrist, hand and finger(s), initial encounter: Secondary | ICD-10-CM | POA: Diagnosis present

## 2023-10-18 DIAGNOSIS — S50311A Abrasion of right elbow, initial encounter: Secondary | ICD-10-CM | POA: Insufficient documentation

## 2023-10-18 DIAGNOSIS — Z7901 Long term (current) use of anticoagulants: Secondary | ICD-10-CM | POA: Diagnosis not present

## 2023-10-18 MED ORDER — TETANUS-DIPHTHERIA TOXOIDS TD 5-2 LFU IM INJ
0.5000 mL | INJECTION | Freq: Once | INTRAMUSCULAR | Status: AC
  Administered 2023-10-18: 0.5 mL via INTRAMUSCULAR
  Filled 2023-10-18: qty 0.5

## 2023-10-18 MED ORDER — ACETAMINOPHEN 325 MG PO TABS
650.0000 mg | ORAL_TABLET | Freq: Once | ORAL | Status: AC
  Administered 2023-10-18: 650 mg via ORAL
  Filled 2023-10-18: qty 2
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
## 2023-10-18 NOTE — ED Notes (Signed)
 Cleaned abrasions on right elbow and right leg with wound cleanser and normal saline. Pt tolerated well. Bleeding controlled at this time.

## 2023-10-18 NOTE — Discharge Instructions (Addendum)
 Go to [facility] if you develop swelling to your right thigh, severe pain, or any other problems.  Use Tylenol  as directed for pain
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
## 2023-10-18 NOTE — ED Notes (Signed)
 Wound care provided per providers verbal orders.  Pt and visitor verbalized understanding of discharge/follow up instructions including wound care and pain control.  No other issues at this time.

## 2023-10-19 ENCOUNTER — Emergency Department (HOSPITAL_COMMUNITY)
Admission: EM | Admit: 2023-10-19 | Discharge: 2023-10-19 | Disposition: A | Discharge status: Home / Self Care | Attending: Emergency Medicine | Admitting: Emergency Medicine

## 2023-10-19 ENCOUNTER — Encounter (HOSPITAL_COMMUNITY): Payer: Self-pay

## 2023-10-19 ENCOUNTER — Other Ambulatory Visit: Payer: Self-pay

## 2023-10-19 DIAGNOSIS — S7011XA Contusion of right thigh, initial encounter: Secondary | ICD-10-CM | POA: Diagnosis not present

## 2023-10-19 DIAGNOSIS — Z7901 Long term (current) use of anticoagulants: Secondary | ICD-10-CM | POA: Insufficient documentation

## 2023-10-19 DIAGNOSIS — Z7982 Long term (current) use of aspirin: Secondary | ICD-10-CM | POA: Insufficient documentation

## 2023-10-19 DIAGNOSIS — I2510 Atherosclerotic heart disease of native coronary artery without angina pectoris: Secondary | ICD-10-CM | POA: Diagnosis not present

## 2023-10-19 DIAGNOSIS — I119 Hypertensive heart disease without heart failure: Secondary | ICD-10-CM | POA: Diagnosis not present

## 2023-10-19 DIAGNOSIS — R600 Localized edema: Secondary | ICD-10-CM | POA: Diagnosis not present

## 2023-10-19 DIAGNOSIS — M79651 Pain in right thigh: Secondary | ICD-10-CM

## 2023-10-19 DIAGNOSIS — W19XXXD Unspecified fall, subsequent encounter: Secondary | ICD-10-CM

## 2023-10-19 DIAGNOSIS — M25551 Pain in right hip: Secondary | ICD-10-CM | POA: Diagnosis not present

## 2023-10-19 DIAGNOSIS — Z79899 Other long term (current) drug therapy: Secondary | ICD-10-CM | POA: Diagnosis not present
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
## 2023-10-19 NOTE — ED Notes (Signed)
 Patient declined lab work, provider aware.

## 2023-10-19 NOTE — ED Provider Notes (Incomplete)
 I provided a substantive portion of the care of this patient.  I personally made/approved the management plan for this patient and take responsibility for the patient management. {Remember to document shared critical care using "edcritical" dot phrase:1}
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
## 2023-10-19 NOTE — Discharge Instructions (Addendum)
 You were seen today for acute right thigh pain after a fall yesterday.  Your x-rays yesterday did not show any acute fractures.  However with your pain and swelling today, Ortho came and saw you and recommended that you continue to elevate, ice and compress the area.  You can also additionally use Tylenol  for additional pain medication.  Take Tylenol  (acetominophen)  650mg  every 4-6 hours, as needed for pain or fever. Do not take more than 4,000 mg in a 24-hour period. As this may cause liver damage. While this is rare, if you begin to develop yellowing of the skin or eyes, stop taking and return to ER immediately.  Recommend you continue to change dressings daily and wash with soap and water.  Monitor for signs of infection and if you begin to develop any new numbness, spreading redness over the skin with accompanying warmth, fever, uncontrolled pain return to the ED for further evaluation.

## 2023-10-19 NOTE — ED Notes (Signed)
 Leg circumference: Right and Left calf, both 40.4 cm Right thigh: 50.2 cm Left thigh: 47.0 cm
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
## 2023-11-04 ENCOUNTER — Encounter: Payer: Self-pay | Admitting: *Deleted

## 2023-11-05 ENCOUNTER — Telehealth: Payer: BC Managed Care – PPO | Admitting: Adult Health

## 2023-11-05 DIAGNOSIS — G4733 Obstructive sleep apnea (adult) (pediatric): Secondary | ICD-10-CM

## 2023-11-05 NOTE — Patient Instructions (Signed)
 Continue using CPAP nightly and greater than 4 hours each night If your symptoms worsen or you develop new symptoms please let us  know.
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
## 2023-11-07 ENCOUNTER — Other Ambulatory Visit: Payer: Self-pay

## 2023-11-07 ENCOUNTER — Other Ambulatory Visit (HOSPITAL_BASED_OUTPATIENT_CLINIC_OR_DEPARTMENT_OTHER): Payer: Self-pay

## 2023-11-07 MED ORDER — OZEMPIC (2 MG/DOSE) 8 MG/3ML ~~LOC~~ SOPN
2.0000 mg | PEN_INJECTOR | SUBCUTANEOUS | 5 refills | Status: AC
  Filled 2023-11-07: qty 3, 28d supply, fill #0
  Filled 2023-12-02: qty 3, 28d supply, fill #1
  Filled 2023-12-31: qty 3, 28d supply, fill #2
  Filled 2024-01-27: qty 3, 28d supply, fill #3
  Filled 2024-02-25: qty 3, 28d supply, fill #4
  Filled 2024-03-24: qty 3, 28d supply, fill #5

## 2024-01-04 ENCOUNTER — Other Ambulatory Visit (HOSPITAL_BASED_OUTPATIENT_CLINIC_OR_DEPARTMENT_OTHER): Payer: Self-pay

## 2024-01-06 ENCOUNTER — Other Ambulatory Visit: Payer: Self-pay | Admitting: Medical Genetics

## 2024-01-06 DIAGNOSIS — Z006 Encounter for examination for normal comparison and control in clinical research program: Secondary | ICD-10-CM

## 2024-03-28 ENCOUNTER — Other Ambulatory Visit (HOSPITAL_BASED_OUTPATIENT_CLINIC_OR_DEPARTMENT_OTHER): Payer: Self-pay

## 2024-11-03 ENCOUNTER — Telehealth: Admitting: Adult Health
